# Patient Record
Sex: Male | Born: 1935 | Race: White | Hispanic: No | Marital: Married | Smoking: Never smoker
Health system: Southern US, Community
[De-identification: ages and names within clinical notes are randomized; demographics above are authoritative.]

## PROBLEM LIST (undated history)

## (undated) DIAGNOSIS — F329 Major depressive disorder, single episode, unspecified: Secondary | ICD-10-CM

## (undated) DIAGNOSIS — H269 Unspecified cataract: Secondary | ICD-10-CM

## (undated) DIAGNOSIS — I4821 Permanent atrial fibrillation: Secondary | ICD-10-CM

## (undated) DIAGNOSIS — E785 Hyperlipidemia, unspecified: Secondary | ICD-10-CM

## (undated) DIAGNOSIS — I2510 Atherosclerotic heart disease of native coronary artery without angina pectoris: Secondary | ICD-10-CM

## (undated) DIAGNOSIS — I255 Ischemic cardiomyopathy: Secondary | ICD-10-CM

## (undated) DIAGNOSIS — F32A Depression, unspecified: Secondary | ICD-10-CM

## (undated) DIAGNOSIS — I340 Nonrheumatic mitral (valve) insufficiency: Secondary | ICD-10-CM

## (undated) DIAGNOSIS — D649 Anemia, unspecified: Secondary | ICD-10-CM

## (undated) DIAGNOSIS — R339 Retention of urine, unspecified: Secondary | ICD-10-CM

## (undated) DIAGNOSIS — N400 Enlarged prostate without lower urinary tract symptoms: Secondary | ICD-10-CM

## (undated) DIAGNOSIS — R001 Bradycardia, unspecified: Secondary | ICD-10-CM

## (undated) DIAGNOSIS — K449 Diaphragmatic hernia without obstruction or gangrene: Secondary | ICD-10-CM

## (undated) DIAGNOSIS — I5022 Chronic systolic (congestive) heart failure: Secondary | ICD-10-CM

## (undated) DIAGNOSIS — I10 Essential (primary) hypertension: Secondary | ICD-10-CM

## (undated) HISTORY — PX: HERNIA REPAIR: SHX51

## (undated) HISTORY — DX: Nonrheumatic mitral (valve) insufficiency: I34.0

## (undated) HISTORY — DX: Depression, unspecified: F32.A

## (undated) HISTORY — DX: Essential (primary) hypertension: I10

## (undated) HISTORY — DX: Anemia, unspecified: D64.9

## (undated) HISTORY — PX: HAND SURGERY: SHX662

## (undated) HISTORY — DX: Diaphragmatic hernia without obstruction or gangrene: K44.9

## (undated) HISTORY — DX: Atherosclerotic heart disease of native coronary artery without angina pectoris: I25.10

## (undated) HISTORY — DX: Ischemic cardiomyopathy: I25.5

## (undated) HISTORY — DX: Permanent atrial fibrillation: I48.21

## (undated) HISTORY — DX: Benign prostatic hyperplasia without lower urinary tract symptoms: N40.0

## (undated) HISTORY — PX: EYE SURGERY: SHX253

## (undated) HISTORY — DX: Unspecified cataract: H26.9

## (undated) HISTORY — DX: Major depressive disorder, single episode, unspecified: F32.9

## (undated) HISTORY — PX: CATARACT EXTRACTION: SUR2

## (undated) HISTORY — DX: Hyperlipidemia, unspecified: E78.5

## (undated) HISTORY — DX: Bradycardia, unspecified: R00.1

## (undated) HISTORY — PX: CARDIAC DEFIBRILLATOR PLACEMENT: SHX171

## (undated) HISTORY — DX: Chronic systolic (congestive) heart failure: I50.22

## (undated) HISTORY — DX: Retention of urine, unspecified: R33.9

---

## 2000-01-09 ENCOUNTER — Ambulatory Visit (HOSPITAL_COMMUNITY): Admission: RE | Admit: 2000-01-09 | Discharge: 2000-01-10 | Payer: Self-pay | Admitting: *Deleted

## 2001-07-08 ENCOUNTER — Encounter: Payer: Self-pay | Admitting: Specialist

## 2001-07-08 ENCOUNTER — Encounter: Admission: RE | Admit: 2001-07-08 | Discharge: 2001-07-08 | Payer: Self-pay | Admitting: Specialist

## 2001-08-11 ENCOUNTER — Encounter: Admission: RE | Admit: 2001-08-11 | Discharge: 2001-09-15 | Payer: Self-pay | Admitting: Specialist

## 2001-11-13 ENCOUNTER — Inpatient Hospital Stay (HOSPITAL_COMMUNITY): Admission: EM | Admit: 2001-11-13 | Discharge: 2001-11-15 | Payer: Self-pay | Admitting: Internal Medicine

## 2001-11-13 ENCOUNTER — Encounter (INDEPENDENT_AMBULATORY_CARE_PROVIDER_SITE_OTHER): Payer: Self-pay | Admitting: *Deleted

## 2001-12-22 ENCOUNTER — Encounter: Payer: Self-pay | Admitting: Internal Medicine

## 2001-12-22 ENCOUNTER — Ambulatory Visit (HOSPITAL_COMMUNITY): Admission: RE | Admit: 2001-12-22 | Discharge: 2001-12-22 | Payer: Self-pay | Admitting: Internal Medicine

## 2001-12-29 ENCOUNTER — Encounter: Payer: Self-pay | Admitting: Internal Medicine

## 2001-12-29 ENCOUNTER — Ambulatory Visit (HOSPITAL_COMMUNITY): Admission: RE | Admit: 2001-12-29 | Discharge: 2001-12-29 | Payer: Self-pay | Admitting: Internal Medicine

## 2002-04-22 ENCOUNTER — Encounter: Admission: RE | Admit: 2002-04-22 | Discharge: 2002-05-28 | Payer: Self-pay | Admitting: Specialist

## 2003-03-30 ENCOUNTER — Encounter: Admission: RE | Admit: 2003-03-30 | Discharge: 2003-03-30 | Payer: Self-pay | Admitting: *Deleted

## 2003-10-03 ENCOUNTER — Encounter
Admission: RE | Admit: 2003-10-03 | Discharge: 2003-11-01 | Payer: Self-pay | Admitting: Physical Medicine and Rehabilitation

## 2004-07-17 ENCOUNTER — Encounter: Payer: Self-pay | Admitting: Internal Medicine

## 2004-07-19 ENCOUNTER — Ambulatory Visit (HOSPITAL_COMMUNITY): Admission: RE | Admit: 2004-07-19 | Discharge: 2004-07-19 | Payer: Self-pay | Admitting: Internal Medicine

## 2005-01-28 ENCOUNTER — Encounter: Payer: Self-pay | Admitting: Internal Medicine

## 2005-06-18 ENCOUNTER — Encounter: Payer: Self-pay | Admitting: Internal Medicine

## 2005-07-01 ENCOUNTER — Ambulatory Visit: Payer: Self-pay | Admitting: Internal Medicine

## 2005-07-02 ENCOUNTER — Ambulatory Visit: Payer: Self-pay | Admitting: Internal Medicine

## 2005-07-29 ENCOUNTER — Ambulatory Visit: Payer: Self-pay | Admitting: Internal Medicine

## 2005-08-06 ENCOUNTER — Ambulatory Visit: Payer: Self-pay | Admitting: Internal Medicine

## 2005-08-06 ENCOUNTER — Encounter: Payer: Self-pay | Admitting: Internal Medicine

## 2005-08-06 ENCOUNTER — Ambulatory Visit: Payer: Self-pay

## 2005-08-09 ENCOUNTER — Ambulatory Visit: Payer: Self-pay | Admitting: Internal Medicine

## 2005-08-13 ENCOUNTER — Ambulatory Visit (HOSPITAL_COMMUNITY): Admission: AD | Admit: 2005-08-13 | Discharge: 2005-08-15 | Payer: Self-pay | Admitting: Cardiovascular Disease

## 2005-08-15 ENCOUNTER — Ambulatory Visit: Payer: Self-pay | Admitting: Cardiology

## 2005-08-28 ENCOUNTER — Ambulatory Visit: Payer: Self-pay

## 2005-08-28 ENCOUNTER — Ambulatory Visit: Payer: Self-pay | Admitting: Cardiology

## 2005-09-16 ENCOUNTER — Ambulatory Visit: Payer: Self-pay | Admitting: Internal Medicine

## 2005-10-09 ENCOUNTER — Ambulatory Visit: Payer: Self-pay | Admitting: Internal Medicine

## 2005-12-17 ENCOUNTER — Ambulatory Visit: Payer: Self-pay | Admitting: Internal Medicine

## 2006-02-03 ENCOUNTER — Ambulatory Visit: Payer: Self-pay | Admitting: Internal Medicine

## 2006-02-03 ENCOUNTER — Inpatient Hospital Stay (HOSPITAL_COMMUNITY): Admission: EM | Admit: 2006-02-03 | Discharge: 2006-02-07 | Payer: Self-pay | Admitting: Emergency Medicine

## 2006-02-04 ENCOUNTER — Encounter: Payer: Self-pay | Admitting: Cardiology

## 2006-02-10 ENCOUNTER — Ambulatory Visit: Payer: Self-pay | Admitting: Cardiology

## 2006-02-11 ENCOUNTER — Ambulatory Visit: Payer: Self-pay | Admitting: Internal Medicine

## 2006-02-14 ENCOUNTER — Ambulatory Visit: Payer: Self-pay | Admitting: Cardiovascular Disease

## 2006-02-14 ENCOUNTER — Ambulatory Visit: Payer: Self-pay | Admitting: Internal Medicine

## 2006-02-21 ENCOUNTER — Ambulatory Visit: Payer: Self-pay | Admitting: Cardiovascular Disease

## 2006-02-28 ENCOUNTER — Ambulatory Visit: Payer: Self-pay | Admitting: Cardiovascular Disease

## 2006-03-06 ENCOUNTER — Ambulatory Visit: Payer: Self-pay | Admitting: Cardiology

## 2006-03-11 ENCOUNTER — Ambulatory Visit: Payer: Self-pay | Admitting: Cardiology

## 2006-03-14 ENCOUNTER — Ambulatory Visit: Payer: Self-pay | Admitting: Internal Medicine

## 2006-03-17 ENCOUNTER — Ambulatory Visit: Payer: Self-pay | Admitting: Internal Medicine

## 2006-03-17 ENCOUNTER — Ambulatory Visit (HOSPITAL_COMMUNITY): Admission: RE | Admit: 2006-03-17 | Discharge: 2006-03-17 | Payer: Self-pay | Admitting: Internal Medicine

## 2006-03-24 ENCOUNTER — Ambulatory Visit: Payer: Self-pay | Admitting: Internal Medicine

## 2006-04-01 ENCOUNTER — Ambulatory Visit: Payer: Self-pay | Admitting: Cardiology

## 2006-04-01 ENCOUNTER — Ambulatory Visit: Payer: Self-pay | Admitting: Internal Medicine

## 2006-04-02 ENCOUNTER — Ambulatory Visit: Payer: Self-pay | Admitting: Internal Medicine

## 2006-04-02 ENCOUNTER — Inpatient Hospital Stay (HOSPITAL_COMMUNITY): Admission: AD | Admit: 2006-04-02 | Discharge: 2006-04-07 | Payer: Self-pay | Admitting: Internal Medicine

## 2006-04-09 ENCOUNTER — Ambulatory Visit: Payer: Self-pay | Admitting: Cardiology

## 2006-04-14 ENCOUNTER — Ambulatory Visit: Payer: Self-pay | Admitting: Cardiology

## 2006-04-18 ENCOUNTER — Ambulatory Visit: Payer: Self-pay | Admitting: Internal Medicine

## 2006-04-25 ENCOUNTER — Ambulatory Visit: Payer: Self-pay | Admitting: Cardiology

## 2006-05-05 ENCOUNTER — Ambulatory Visit: Admission: RE | Admit: 2006-05-05 | Discharge: 2006-05-05 | Payer: Self-pay | Admitting: Internal Medicine

## 2006-05-09 ENCOUNTER — Ambulatory Visit: Payer: Self-pay | Admitting: Cardiology

## 2006-05-23 ENCOUNTER — Ambulatory Visit: Payer: Self-pay | Admitting: Cardiovascular Disease

## 2006-06-10 ENCOUNTER — Ambulatory Visit: Payer: Self-pay | Admitting: *Deleted

## 2006-06-16 ENCOUNTER — Ambulatory Visit: Payer: Self-pay | Admitting: Internal Medicine

## 2006-06-18 ENCOUNTER — Encounter: Payer: Self-pay | Admitting: Cardiovascular Disease

## 2006-06-18 ENCOUNTER — Ambulatory Visit: Payer: Self-pay

## 2006-06-26 ENCOUNTER — Ambulatory Visit: Payer: Self-pay | Admitting: Pulmonary Disease

## 2006-06-27 ENCOUNTER — Ambulatory Visit: Payer: Self-pay | Admitting: Cardiology

## 2006-07-25 ENCOUNTER — Ambulatory Visit: Payer: Self-pay | Admitting: Cardiovascular Disease

## 2006-08-11 ENCOUNTER — Ambulatory Visit: Payer: Self-pay | Admitting: Pulmonary Disease

## 2006-08-15 ENCOUNTER — Ambulatory Visit: Payer: Self-pay | Admitting: Cardiology

## 2006-08-26 ENCOUNTER — Ambulatory Visit: Payer: Self-pay | Admitting: Internal Medicine

## 2006-08-26 ENCOUNTER — Ambulatory Visit: Payer: Self-pay | Admitting: Cardiovascular Disease

## 2006-08-26 ENCOUNTER — Ambulatory Visit: Payer: Self-pay | Admitting: Cardiology

## 2006-09-02 ENCOUNTER — Ambulatory Visit: Payer: Self-pay | Admitting: Internal Medicine

## 2006-09-05 ENCOUNTER — Ambulatory Visit: Payer: Self-pay | Admitting: Internal Medicine

## 2006-09-05 ENCOUNTER — Observation Stay (HOSPITAL_COMMUNITY): Admission: AD | Admit: 2006-09-05 | Discharge: 2006-09-06 | Payer: Self-pay | Admitting: Internal Medicine

## 2006-09-09 ENCOUNTER — Ambulatory Visit: Payer: Self-pay | Admitting: Internal Medicine

## 2006-09-16 ENCOUNTER — Ambulatory Visit: Payer: Self-pay | Admitting: Internal Medicine

## 2006-09-18 ENCOUNTER — Ambulatory Visit (HOSPITAL_COMMUNITY): Admission: RE | Admit: 2006-09-18 | Discharge: 2006-09-18 | Payer: Self-pay | Admitting: Internal Medicine

## 2006-09-26 ENCOUNTER — Ambulatory Visit: Payer: Self-pay | Admitting: Internal Medicine

## 2006-09-26 ENCOUNTER — Ambulatory Visit: Payer: Self-pay | Admitting: Cardiology

## 2006-09-30 ENCOUNTER — Ambulatory Visit: Payer: Self-pay | Admitting: Cardiology

## 2006-10-06 ENCOUNTER — Encounter: Payer: Self-pay | Admitting: Internal Medicine

## 2006-10-13 ENCOUNTER — Ambulatory Visit: Payer: Self-pay | Admitting: Cardiovascular Disease

## 2006-10-22 ENCOUNTER — Ambulatory Visit: Payer: Self-pay | Admitting: Cardiology

## 2006-10-29 ENCOUNTER — Encounter: Payer: Self-pay | Admitting: Internal Medicine

## 2006-10-30 ENCOUNTER — Ambulatory Visit: Payer: Self-pay | Admitting: Internal Medicine

## 2006-11-07 ENCOUNTER — Ambulatory Visit: Payer: Self-pay | Admitting: Nurse Practitioner

## 2006-11-07 ENCOUNTER — Ambulatory Visit: Payer: Self-pay | Admitting: Cardiology

## 2006-11-20 ENCOUNTER — Ambulatory Visit: Payer: Self-pay | Admitting: Cardiology

## 2006-12-16 ENCOUNTER — Ambulatory Visit: Payer: Self-pay | Admitting: Internal Medicine

## 2006-12-16 ENCOUNTER — Ambulatory Visit: Payer: Self-pay | Admitting: Cardiology

## 2007-01-07 ENCOUNTER — Ambulatory Visit: Payer: Self-pay | Admitting: Cardiology

## 2007-01-07 ENCOUNTER — Ambulatory Visit: Payer: Self-pay | Admitting: Internal Medicine

## 2007-01-16 ENCOUNTER — Ambulatory Visit: Payer: Self-pay | Admitting: Cardiology

## 2007-01-16 ENCOUNTER — Ambulatory Visit: Payer: Self-pay | Admitting: Internal Medicine

## 2007-01-16 LAB — CONVERTED CEMR LAB
Albumin: 3.8 g/dL (ref 3.5–5.2)
Alkaline Phosphatase: 68 units/L (ref 39–117)
BUN: 25 mg/dL — ABNORMAL HIGH (ref 6–23)
Basophils Absolute: 0 10*3/uL (ref 0.0–0.1)
Creatinine, Ser: 1.2 mg/dL (ref 0.4–1.5)
Eosinophils Absolute: 0.1 10*3/uL (ref 0.0–0.6)
GFR calc Af Amer: 75 mL/min
GFR calc non Af Amer: 62 mL/min
HDL: 40.6 mg/dL (ref >39.0)
Hemoglobin: 13.5 g/dL (ref 13.0–17.0)
INR: 3.2 — ABNORMAL HIGH (ref 0.9–2.0)
MCHC: 35.3 g/dL (ref 30.0–36.0)
MCV: 100.6 fL — ABNORMAL HIGH (ref 78.0–100.0)
Monocytes Absolute: 0.7 10*3/uL (ref 0.2–0.7)
Monocytes Relative: 10 % (ref 3.0–11.0)
Neutrophils Relative %: 73.5 % (ref 43.0–77.0)
Potassium: 4.1 meq/L (ref 3.5–5.1)
Pro B Natriuretic peptide (BNP): 926 pg/mL — ABNORMAL HIGH (ref 0.0–100.0)
RDW: 13.7 % (ref 11.5–14.6)
Sodium: 142 meq/L (ref 135–145)
Total CHOL/HDL Ratio: 3.5
Triglycerides: 63 mg/dL (ref 0–149)
VLDL: 13 mg/dL (ref 0–40)
aPTT: 41.7 s — ABNORMAL HIGH (ref 26.5–36.5)

## 2007-01-30 ENCOUNTER — Ambulatory Visit: Payer: Self-pay | Admitting: Internal Medicine

## 2007-02-26 ENCOUNTER — Ambulatory Visit: Payer: Self-pay | Admitting: Internal Medicine

## 2007-02-26 ENCOUNTER — Ambulatory Visit: Payer: Self-pay | Admitting: Cardiology

## 2007-02-26 LAB — CONVERTED CEMR LAB
BUN: 27 mg/dL — ABNORMAL HIGH (ref 6–23)
Calcium: 9.2 mg/dL (ref 8.4–10.5)
Chloride: 103 meq/L (ref 96–112)
Creatinine, Ser: 1.2 mg/dL (ref 0.4–1.5)
GFR calc non Af Amer: 62 mL/min
Pro B Natriuretic peptide (BNP): 398 pg/mL — ABNORMAL HIGH (ref 0.0–100.0)
Sodium: 141 meq/L (ref 135–145)

## 2007-03-02 ENCOUNTER — Emergency Department (HOSPITAL_COMMUNITY): Admission: EM | Admit: 2007-03-02 | Discharge: 2007-03-03 | Payer: Self-pay | Admitting: Emergency Medicine

## 2007-03-02 ENCOUNTER — Ambulatory Visit: Payer: Self-pay | Admitting: Cardiovascular Disease

## 2007-03-17 ENCOUNTER — Ambulatory Visit: Payer: Self-pay | Admitting: Internal Medicine

## 2007-03-25 ENCOUNTER — Ambulatory Visit: Payer: Self-pay | Admitting: *Deleted

## 2007-04-15 ENCOUNTER — Ambulatory Visit: Payer: Self-pay | Admitting: Internal Medicine

## 2007-05-13 ENCOUNTER — Ambulatory Visit: Payer: Self-pay | Admitting: Cardiology

## 2007-06-10 ENCOUNTER — Ambulatory Visit: Payer: Self-pay | Admitting: Cardiology

## 2007-06-12 ENCOUNTER — Ambulatory Visit: Payer: Self-pay | Admitting: Internal Medicine

## 2007-06-22 ENCOUNTER — Ambulatory Visit: Payer: Self-pay | Admitting: Internal Medicine

## 2007-07-08 ENCOUNTER — Ambulatory Visit: Payer: Self-pay | Admitting: Cardiology

## 2007-07-20 ENCOUNTER — Ambulatory Visit: Payer: Self-pay | Admitting: Cardiology

## 2007-07-20 ENCOUNTER — Ambulatory Visit: Payer: Self-pay | Admitting: Internal Medicine

## 2007-07-20 LAB — CONVERTED CEMR LAB
AST: 25 units/L (ref 0–37)
Albumin: 3.9 g/dL (ref 3.5–5.2)
Alkaline Phosphatase: 49 units/L (ref 39–117)
BUN: 23 mg/dL (ref 6–23)
Chloride: 106 meq/L (ref 96–112)
Creatinine, Ser: 1.1 mg/dL (ref 0.4–1.5)
Free T4: 0.8 ng/dL (ref 0.6–1.6)
GFR calc non Af Amer: 68 mL/min
Sodium: 145 meq/L (ref 135–145)
T3, Free: 2.3 pg/mL (ref 2.3–4.2)
Total Bilirubin: 0.9 mg/dL (ref 0.3–1.2)

## 2007-07-27 ENCOUNTER — Ambulatory Visit: Payer: Self-pay | Admitting: Cardiology

## 2007-07-28 ENCOUNTER — Encounter: Payer: Self-pay | Admitting: Internal Medicine

## 2007-07-28 ENCOUNTER — Ambulatory Visit: Payer: Self-pay

## 2007-08-20 ENCOUNTER — Ambulatory Visit: Payer: Self-pay | Admitting: Cardiology

## 2007-08-27 ENCOUNTER — Ambulatory Visit: Payer: Self-pay | Admitting: Internal Medicine

## 2007-09-15 ENCOUNTER — Ambulatory Visit: Payer: Self-pay | Admitting: Internal Medicine

## 2007-09-17 ENCOUNTER — Ambulatory Visit: Payer: Self-pay | Admitting: Cardiology

## 2007-10-12 ENCOUNTER — Ambulatory Visit: Payer: Self-pay | Admitting: Internal Medicine

## 2007-10-27 ENCOUNTER — Ambulatory Visit: Payer: Self-pay | Admitting: Internal Medicine

## 2007-11-12 ENCOUNTER — Ambulatory Visit: Payer: Self-pay | Admitting: Cardiology

## 2007-11-30 ENCOUNTER — Ambulatory Visit: Payer: Self-pay | Admitting: Cardiology

## 2007-12-17 ENCOUNTER — Ambulatory Visit: Payer: Self-pay | Admitting: Internal Medicine

## 2008-01-19 ENCOUNTER — Ambulatory Visit: Payer: Self-pay | Admitting: Cardiology

## 2008-01-19 ENCOUNTER — Ambulatory Visit: Payer: Self-pay | Admitting: Internal Medicine

## 2008-01-26 ENCOUNTER — Ambulatory Visit: Payer: Self-pay | Admitting: Cardiology

## 2008-01-26 ENCOUNTER — Ambulatory Visit: Payer: Self-pay | Admitting: Internal Medicine

## 2008-01-26 LAB — CONVERTED CEMR LAB
BUN: 24 mg/dL — ABNORMAL HIGH (ref 6–23)
Basophils Relative: 0.9 % (ref 0.0–1.0)
CO2: 32 meq/L (ref 19–32)
Calcium: 9.6 mg/dL (ref 8.4–10.5)
Creatinine, Ser: 1.3 mg/dL (ref 0.4–1.5)
Eosinophils Relative: 2.3 % (ref 0.0–5.0)
Glucose, Bld: 106 mg/dL — ABNORMAL HIGH (ref 70–99)
Hemoglobin: 13.9 g/dL (ref 13.0–17.0)
INR: 2.9 — ABNORMAL HIGH (ref 0.8–1.0)
Monocytes Relative: 11.4 % — ABNORMAL HIGH (ref 3.0–11.0)
Platelets: 184 10*3/uL (ref 150–400)
Prothrombin Time: 21.4 s — ABNORMAL HIGH (ref 10.9–13.3)
RDW: 13.1 % (ref 11.5–14.6)
WBC: 6.2 10*3/uL (ref 4.5–10.5)

## 2008-02-05 ENCOUNTER — Ambulatory Visit: Payer: Self-pay | Admitting: Internal Medicine

## 2008-02-05 ENCOUNTER — Ambulatory Visit (HOSPITAL_COMMUNITY): Admission: RE | Admit: 2008-02-05 | Discharge: 2008-02-05 | Payer: Self-pay | Admitting: Internal Medicine

## 2008-02-08 ENCOUNTER — Ambulatory Visit: Payer: Self-pay | Admitting: Cardiology

## 2008-02-22 ENCOUNTER — Ambulatory Visit: Payer: Self-pay | Admitting: Cardiology

## 2008-03-07 ENCOUNTER — Ambulatory Visit: Payer: Self-pay

## 2008-03-07 ENCOUNTER — Ambulatory Visit: Payer: Self-pay | Admitting: Cardiovascular Disease

## 2008-03-07 LAB — CONVERTED CEMR LAB
Free T4: 1 ng/dL (ref 0.6–1.6)
T3, Free: 2.2 pg/mL — ABNORMAL LOW (ref 2.3–4.2)
TSH: 1.05 microintl units/mL (ref 0.35–5.50)

## 2008-03-18 ENCOUNTER — Ambulatory Visit: Payer: Self-pay | Admitting: Internal Medicine

## 2008-04-12 ENCOUNTER — Ambulatory Visit: Payer: Self-pay | Admitting: Internal Medicine

## 2008-04-12 ENCOUNTER — Ambulatory Visit: Payer: Self-pay | Admitting: Cardiovascular Disease

## 2008-04-12 LAB — CONVERTED CEMR LAB
ALT: 22 units/L (ref 0–53)
AST: 25 units/L (ref 0–37)
Basophils Absolute: 0 10*3/uL (ref 0.0–0.1)
Basophils Relative: 0 % (ref 0.0–1.0)
CO2: 32 meq/L (ref 19–32)
Chloride: 103 meq/L (ref 96–112)
Creatinine, Ser: 1.1 mg/dL (ref 0.4–1.5)
Eosinophils Relative: 3.1 % (ref 0.0–5.0)
Free T4: 1 ng/dL (ref 0.6–1.6)
Glucose, Bld: 113 mg/dL — ABNORMAL HIGH (ref 70–99)
Lymphocytes Relative: 20 % (ref 12.0–46.0)
MCHC: 34 g/dL (ref 30.0–36.0)
Monocytes Relative: 9.2 % (ref 3.0–12.0)
Neutrophils Relative %: 67.7 % (ref 43.0–77.0)
RBC: 3.69 M/uL — ABNORMAL LOW (ref 4.22–5.81)
Total Bilirubin: 0.7 mg/dL (ref 0.3–1.2)
WBC: 5.5 10*3/uL (ref 4.5–10.5)

## 2008-05-10 ENCOUNTER — Ambulatory Visit: Payer: Self-pay | Admitting: Internal Medicine

## 2008-05-23 ENCOUNTER — Ambulatory Visit: Payer: Self-pay | Admitting: Internal Medicine

## 2008-06-01 ENCOUNTER — Encounter: Payer: Self-pay | Admitting: Internal Medicine

## 2008-06-01 LAB — CONVERTED CEMR LAB
ALT: 23 U/L
AST: 24 U/L
BUN: 24 mg/dL
CO2: 28 meq/L
Chloride: 103 meq/L
Cholesterol: 234 mg/dL
Creatinine, Ser: 1.2 mg/dL
Glucose, Bld: 120 mg/dL
HCT: 42.6 %
HDL: 47 mg/dL
Hemoglobin: 14 g/dL
LDL Cholesterol: 165 mg/dL
Lymphocytes, automated: 1.5 %
MCV: 102.7 fL
Platelets: 206 10*3/uL
Potassium: 4.4 meq/L
RBC: 4.2 M/uL
Sodium: 140 meq/L
TSH: 6 u[IU]/mL
Total Bilirubin: 0.5 mg/dL
Triglyceride fasting, serum: 109 mg/dL
WBC: 5.5 10*3/uL

## 2008-06-20 ENCOUNTER — Ambulatory Visit: Payer: Self-pay | Admitting: Cardiovascular Disease

## 2008-07-18 ENCOUNTER — Ambulatory Visit: Payer: Self-pay | Admitting: Cardiology

## 2008-08-15 ENCOUNTER — Ambulatory Visit: Payer: Self-pay | Admitting: Cardiology

## 2008-09-06 ENCOUNTER — Ambulatory Visit: Payer: Self-pay | Admitting: Cardiovascular Disease

## 2008-09-07 ENCOUNTER — Ambulatory Visit: Payer: Self-pay | Admitting: Cardiology

## 2008-10-18 ENCOUNTER — Ambulatory Visit: Payer: Self-pay | Admitting: Internal Medicine

## 2008-11-12 ENCOUNTER — Emergency Department (HOSPITAL_BASED_OUTPATIENT_CLINIC_OR_DEPARTMENT_OTHER): Admission: EM | Admit: 2008-11-12 | Discharge: 2008-11-12 | Payer: Self-pay | Admitting: Internal Medicine

## 2008-11-14 ENCOUNTER — Ambulatory Visit: Payer: Self-pay | Admitting: Cardiology

## 2008-12-05 ENCOUNTER — Ambulatory Visit: Payer: Self-pay | Admitting: Internal Medicine

## 2008-12-26 ENCOUNTER — Ambulatory Visit: Payer: Self-pay | Admitting: Cardiology

## 2009-01-09 ENCOUNTER — Ambulatory Visit: Payer: Self-pay | Admitting: Cardiology

## 2009-01-23 ENCOUNTER — Ambulatory Visit: Payer: Self-pay | Admitting: Cardiology

## 2009-02-06 ENCOUNTER — Ambulatory Visit: Payer: Self-pay | Admitting: Internal Medicine

## 2009-02-13 DIAGNOSIS — I4891 Unspecified atrial fibrillation: Secondary | ICD-10-CM

## 2009-02-13 DIAGNOSIS — I2510 Atherosclerotic heart disease of native coronary artery without angina pectoris: Secondary | ICD-10-CM | POA: Insufficient documentation

## 2009-02-13 DIAGNOSIS — E785 Hyperlipidemia, unspecified: Secondary | ICD-10-CM | POA: Insufficient documentation

## 2009-02-13 DIAGNOSIS — I5022 Chronic systolic (congestive) heart failure: Secondary | ICD-10-CM

## 2009-02-13 DIAGNOSIS — I10 Essential (primary) hypertension: Secondary | ICD-10-CM

## 2009-02-13 DIAGNOSIS — I059 Rheumatic mitral valve disease, unspecified: Secondary | ICD-10-CM | POA: Insufficient documentation

## 2009-02-14 ENCOUNTER — Encounter: Payer: Self-pay | Admitting: Internal Medicine

## 2009-02-14 ENCOUNTER — Ambulatory Visit: Payer: Self-pay | Admitting: Internal Medicine

## 2009-02-23 ENCOUNTER — Ambulatory Visit: Payer: Self-pay | Admitting: Internal Medicine

## 2009-02-23 LAB — CONVERTED CEMR LAB
CO2: 32 meq/L (ref 19–32)
Calcium: 9.4 mg/dL (ref 8.4–10.5)
Glucose, Bld: 111 mg/dL — ABNORMAL HIGH (ref 70–99)
Potassium: 4.4 meq/L (ref 3.5–5.1)
Sodium: 140 meq/L (ref 135–145)

## 2009-02-28 ENCOUNTER — Encounter: Payer: Self-pay | Admitting: Internal Medicine

## 2009-03-07 ENCOUNTER — Ambulatory Visit: Payer: Self-pay | Admitting: Cardiovascular Disease

## 2009-03-09 ENCOUNTER — Encounter: Payer: Self-pay | Admitting: Internal Medicine

## 2009-03-09 LAB — CONVERTED CEMR LAB
ALT: 28 units/L
BUN: 31 mg/dL
Calcium: 9.8 mg/dL
Glucose, Bld: 110 mg/dL
Total Bilirubin: 0.5 mg/dL
Total Protein: 7 g/dL

## 2009-03-14 ENCOUNTER — Telehealth: Payer: Self-pay | Admitting: Internal Medicine

## 2009-03-17 ENCOUNTER — Encounter: Payer: Self-pay | Admitting: Internal Medicine

## 2009-03-17 ENCOUNTER — Encounter: Payer: Self-pay | Admitting: Cardiovascular Disease

## 2009-03-17 ENCOUNTER — Ambulatory Visit: Payer: Self-pay | Admitting: Internal Medicine

## 2009-03-17 DIAGNOSIS — Z95810 Presence of automatic (implantable) cardiac defibrillator: Secondary | ICD-10-CM | POA: Insufficient documentation

## 2009-04-04 ENCOUNTER — Ambulatory Visit: Payer: Self-pay | Admitting: Internal Medicine

## 2009-04-28 ENCOUNTER — Encounter (INDEPENDENT_AMBULATORY_CARE_PROVIDER_SITE_OTHER): Payer: Self-pay | Admitting: *Deleted

## 2009-05-02 ENCOUNTER — Encounter: Payer: Self-pay | Admitting: *Deleted

## 2009-05-02 ENCOUNTER — Ambulatory Visit: Payer: Self-pay | Admitting: Cardiology

## 2009-05-30 ENCOUNTER — Ambulatory Visit: Payer: Self-pay | Admitting: Internal Medicine

## 2009-05-30 LAB — CONVERTED CEMR LAB
POC INR: 2.6
Prothrombin Time: 19.5 s

## 2009-06-07 ENCOUNTER — Encounter: Payer: Self-pay | Admitting: *Deleted

## 2009-06-16 ENCOUNTER — Encounter: Payer: Self-pay | Admitting: Internal Medicine

## 2009-06-19 ENCOUNTER — Ambulatory Visit: Payer: Self-pay | Admitting: Internal Medicine

## 2009-06-21 ENCOUNTER — Encounter: Payer: Self-pay | Admitting: Internal Medicine

## 2009-06-27 ENCOUNTER — Ambulatory Visit: Payer: Self-pay | Admitting: Internal Medicine

## 2009-06-27 LAB — CONVERTED CEMR LAB: Prothrombin Time: 18.4 s

## 2009-07-25 ENCOUNTER — Ambulatory Visit: Payer: Self-pay | Admitting: Internal Medicine

## 2009-07-25 LAB — CONVERTED CEMR LAB: POC INR: 2.5

## 2009-08-21 ENCOUNTER — Telehealth (INDEPENDENT_AMBULATORY_CARE_PROVIDER_SITE_OTHER): Payer: Self-pay | Admitting: *Deleted

## 2009-08-21 ENCOUNTER — Telehealth: Payer: Self-pay | Admitting: Internal Medicine

## 2009-08-29 ENCOUNTER — Telehealth (INDEPENDENT_AMBULATORY_CARE_PROVIDER_SITE_OTHER): Payer: Self-pay | Admitting: *Deleted

## 2009-08-30 ENCOUNTER — Ambulatory Visit: Payer: Self-pay | Admitting: Internal Medicine

## 2009-08-30 DIAGNOSIS — N401 Enlarged prostate with lower urinary tract symptoms: Secondary | ICD-10-CM

## 2009-08-31 ENCOUNTER — Ambulatory Visit: Payer: Self-pay | Admitting: Internal Medicine

## 2009-09-11 ENCOUNTER — Telehealth: Payer: Self-pay | Admitting: Internal Medicine

## 2009-09-11 ENCOUNTER — Ambulatory Visit: Payer: Self-pay | Admitting: Internal Medicine

## 2009-09-13 ENCOUNTER — Encounter: Payer: Self-pay | Admitting: Internal Medicine

## 2009-09-13 DIAGNOSIS — E291 Testicular hypofunction: Secondary | ICD-10-CM

## 2009-09-20 ENCOUNTER — Encounter: Payer: Self-pay | Admitting: Internal Medicine

## 2009-09-26 ENCOUNTER — Encounter: Payer: Self-pay | Admitting: Internal Medicine

## 2009-09-26 ENCOUNTER — Ambulatory Visit: Payer: Self-pay | Admitting: Cardiovascular Disease

## 2009-09-26 ENCOUNTER — Ambulatory Visit: Payer: Self-pay | Admitting: Internal Medicine

## 2009-09-29 ENCOUNTER — Ambulatory Visit: Payer: Self-pay | Admitting: Internal Medicine

## 2009-09-29 ENCOUNTER — Ambulatory Visit (HOSPITAL_COMMUNITY): Admission: RE | Admit: 2009-09-29 | Discharge: 2009-09-29 | Payer: Self-pay | Admitting: Internal Medicine

## 2009-10-16 ENCOUNTER — Ambulatory Visit: Payer: Self-pay | Admitting: Internal Medicine

## 2009-10-25 ENCOUNTER — Ambulatory Visit: Payer: Self-pay | Admitting: Internal Medicine

## 2009-10-30 ENCOUNTER — Telehealth: Payer: Self-pay | Admitting: Internal Medicine

## 2009-11-08 ENCOUNTER — Ambulatory Visit: Payer: Self-pay | Admitting: Internal Medicine

## 2009-11-08 LAB — CONVERTED CEMR LAB
ALT: 20 units/L (ref 0–53)
AST: 28 units/L (ref 0–37)
Alkaline Phosphatase: 43 units/L (ref 39–117)
Basophils Relative: 0.6 % (ref 0.0–3.0)
Chloride: 103 meq/L (ref 96–112)
Eosinophils Relative: 1.3 % (ref 0.0–5.0)
GFR calc non Af Amer: 56 mL/min (ref >60)
HCT: 37.9 % — ABNORMAL LOW (ref 39.0–52.0)
LDL Cholesterol: 89 mg/dL (ref 0–99)
Lymphs Abs: 1.5 10*3/uL (ref 0.7–4.0)
MCV: 102.4 fL — ABNORMAL HIGH (ref 78.0–100.0)
Monocytes Absolute: 0.6 10*3/uL (ref 0.1–1.0)
Monocytes Relative: 10.6 % (ref 3.0–12.0)
Neutrophils Relative %: 62.2 % (ref 43.0–77.0)
Platelets: 162 10*3/uL (ref 150.0–400.0)
Potassium: 4.6 meq/L (ref 3.5–5.1)
RBC: 3.7 M/uL — ABNORMAL LOW (ref 4.22–5.81)
TSH: 1.32 microintl units/mL (ref 0.35–5.50)
Total Bilirubin: 1 mg/dL (ref 0.3–1.2)
Total CHOL/HDL Ratio: 2
VLDL: 13 mg/dL (ref 0.0–40.0)
WBC: 5.8 10*3/uL (ref 4.5–10.5)

## 2009-11-09 ENCOUNTER — Ambulatory Visit: Payer: Self-pay | Admitting: Cardiovascular Disease

## 2009-11-09 LAB — CONVERTED CEMR LAB: POC INR: 2.6

## 2009-11-30 ENCOUNTER — Ambulatory Visit: Payer: Self-pay | Admitting: Internal Medicine

## 2009-12-12 ENCOUNTER — Ambulatory Visit: Payer: Self-pay | Admitting: Internal Medicine

## 2009-12-21 ENCOUNTER — Ambulatory Visit: Payer: Self-pay | Admitting: Cardiology

## 2009-12-21 ENCOUNTER — Ambulatory Visit: Payer: Self-pay | Admitting: Internal Medicine

## 2010-01-11 ENCOUNTER — Ambulatory Visit: Payer: Self-pay | Admitting: Internal Medicine

## 2010-01-11 ENCOUNTER — Ambulatory Visit: Payer: Self-pay | Admitting: Cardiology

## 2010-01-11 LAB — CONVERTED CEMR LAB: POC INR: 2.3

## 2010-02-07 ENCOUNTER — Encounter: Payer: Self-pay | Admitting: Internal Medicine

## 2010-02-08 ENCOUNTER — Ambulatory Visit: Payer: Self-pay | Admitting: Cardiology

## 2010-02-08 LAB — CONVERTED CEMR LAB: POC INR: 2.5

## 2010-02-14 ENCOUNTER — Ambulatory Visit: Payer: Self-pay | Admitting: Internal Medicine

## 2010-03-15 ENCOUNTER — Ambulatory Visit: Payer: Self-pay | Admitting: Internal Medicine

## 2010-03-15 LAB — CONVERTED CEMR LAB: POC INR: 2

## 2010-03-16 ENCOUNTER — Telehealth: Payer: Self-pay | Admitting: Internal Medicine

## 2010-03-19 ENCOUNTER — Telehealth: Payer: Self-pay | Admitting: Internal Medicine

## 2010-03-28 ENCOUNTER — Ambulatory Visit: Payer: Self-pay | Admitting: Internal Medicine

## 2010-03-28 ENCOUNTER — Encounter: Payer: Self-pay | Admitting: Internal Medicine

## 2010-03-28 DIAGNOSIS — R5381 Other malaise: Secondary | ICD-10-CM | POA: Insufficient documentation

## 2010-03-28 DIAGNOSIS — R5383 Other fatigue: Secondary | ICD-10-CM

## 2010-03-29 LAB — CONVERTED CEMR LAB
Basophils Absolute: 0 10*3/uL (ref 0.0–0.1)
Basophils Relative: 0.5 % (ref 0.0–3.0)
Calcium: 9.3 mg/dL (ref 8.4–10.5)
Eosinophils Relative: 1.1 % (ref 0.0–5.0)
GFR calc non Af Amer: 61.36 mL/min (ref >60)
HCT: 36 % — ABNORMAL LOW (ref 39.0–52.0)
Hemoglobin: 12.6 g/dL — ABNORMAL LOW (ref 13.0–17.0)
Lymphocytes Relative: 28.8 % (ref 12.0–46.0)
Lymphs Abs: 1.7 10*3/uL (ref 0.7–4.0)
Monocytes Relative: 10.8 % (ref 3.0–12.0)
Neutro Abs: 3.6 10*3/uL (ref 1.4–7.7)
Potassium: 4.1 meq/L (ref 3.5–5.1)
RBC: 3.56 M/uL — ABNORMAL LOW (ref 4.22–5.81)
RDW: 14.1 % (ref 11.5–14.6)
Sodium: 143 meq/L (ref 135–145)
WBC: 6 10*3/uL (ref 4.5–10.5)

## 2010-04-16 ENCOUNTER — Ambulatory Visit: Payer: Self-pay

## 2010-04-16 ENCOUNTER — Encounter: Payer: Self-pay | Admitting: Internal Medicine

## 2010-04-16 ENCOUNTER — Ambulatory Visit: Payer: Self-pay | Admitting: Internal Medicine

## 2010-04-16 ENCOUNTER — Ambulatory Visit (HOSPITAL_COMMUNITY): Admission: RE | Admit: 2010-04-16 | Discharge: 2010-04-16 | Payer: Self-pay | Admitting: Internal Medicine

## 2010-04-18 ENCOUNTER — Ambulatory Visit: Payer: Self-pay | Admitting: Internal Medicine

## 2010-04-18 LAB — CONVERTED CEMR LAB: POC INR: 1.8

## 2010-05-09 ENCOUNTER — Ambulatory Visit: Payer: Self-pay | Admitting: Cardiology

## 2010-05-09 LAB — CONVERTED CEMR LAB: POC INR: 2.1

## 2010-05-30 ENCOUNTER — Telehealth: Payer: Self-pay | Admitting: Internal Medicine

## 2010-06-07 ENCOUNTER — Ambulatory Visit: Payer: Self-pay | Admitting: Internal Medicine

## 2010-06-07 LAB — CONVERTED CEMR LAB: POC INR: 1.8

## 2010-06-28 ENCOUNTER — Ambulatory Visit: Payer: Self-pay | Admitting: Internal Medicine

## 2010-07-03 ENCOUNTER — Telehealth: Payer: Self-pay | Admitting: Internal Medicine

## 2010-07-10 ENCOUNTER — Ambulatory Visit: Payer: Self-pay | Admitting: Cardiology

## 2010-07-10 ENCOUNTER — Ambulatory Visit: Payer: Self-pay | Admitting: Internal Medicine

## 2010-07-10 LAB — CONVERTED CEMR LAB: POC INR: 2

## 2010-07-11 ENCOUNTER — Telehealth: Payer: Self-pay | Admitting: Internal Medicine

## 2010-07-20 ENCOUNTER — Telehealth: Payer: Self-pay | Admitting: Internal Medicine

## 2010-07-30 ENCOUNTER — Telehealth: Payer: Self-pay | Admitting: Internal Medicine

## 2010-07-31 ENCOUNTER — Ambulatory Visit: Payer: Self-pay | Admitting: Cardiovascular Disease

## 2010-08-21 ENCOUNTER — Ambulatory Visit: Payer: Self-pay | Admitting: Internal Medicine

## 2010-08-29 ENCOUNTER — Ambulatory Visit: Payer: Self-pay | Admitting: Internal Medicine

## 2010-09-17 ENCOUNTER — Ambulatory Visit: Payer: Self-pay | Admitting: Internal Medicine

## 2010-09-25 ENCOUNTER — Ambulatory Visit: Payer: Self-pay | Admitting: Internal Medicine

## 2010-09-26 ENCOUNTER — Ambulatory Visit: Payer: Self-pay | Admitting: Internal Medicine

## 2010-10-16 ENCOUNTER — Ambulatory Visit: Payer: Self-pay | Admitting: Internal Medicine

## 2010-10-17 ENCOUNTER — Ambulatory Visit: Payer: Self-pay | Admitting: Internal Medicine

## 2010-10-17 ENCOUNTER — Ambulatory Visit: Payer: Self-pay | Admitting: Cardiology

## 2010-10-17 LAB — CONVERTED CEMR LAB: POC INR: 1.9

## 2010-10-22 ENCOUNTER — Encounter: Payer: Self-pay | Admitting: Internal Medicine

## 2010-10-23 ENCOUNTER — Ambulatory Visit: Payer: Self-pay | Admitting: Internal Medicine

## 2010-10-29 ENCOUNTER — Telehealth: Payer: Self-pay | Admitting: Internal Medicine

## 2010-10-31 ENCOUNTER — Ambulatory Visit: Payer: Self-pay | Admitting: Internal Medicine

## 2010-10-31 ENCOUNTER — Ambulatory Visit (HOSPITAL_COMMUNITY)
Admission: RE | Admit: 2010-10-31 | Discharge: 2010-10-31 | Payer: Self-pay | Source: Home / Self Care | Admitting: Internal Medicine

## 2010-10-31 LAB — CONVERTED CEMR LAB
Basophils Relative: 0.6 % (ref 0.0–3.0)
Chloride: 100 meq/L (ref 96–112)
Eosinophils Relative: 0.8 % (ref 0.0–5.0)
GFR calc non Af Amer: 70.71 mL/min (ref >60)
Glucose, Bld: 123 mg/dL — ABNORMAL HIGH (ref 70–99)
HCT: 37.4 % — ABNORMAL LOW (ref 39.0–52.0)
Hemoglobin: 12.9 g/dL — ABNORMAL LOW (ref 13.0–17.0)
Lymphs Abs: 1.4 10*3/uL (ref 0.7–4.0)
Monocytes Relative: 9.2 % (ref 3.0–12.0)
Neutro Abs: 4 10*3/uL (ref 1.4–7.7)
Potassium: 4.5 meq/L (ref 3.5–5.1)
Sodium: 139 meq/L (ref 135–145)
WBC: 6 10*3/uL (ref 4.5–10.5)

## 2010-11-05 ENCOUNTER — Ambulatory Visit: Payer: Self-pay | Admitting: Internal Medicine

## 2010-11-05 ENCOUNTER — Telehealth: Payer: Self-pay | Admitting: Internal Medicine

## 2010-11-06 ENCOUNTER — Ambulatory Visit: Payer: Self-pay | Admitting: Internal Medicine

## 2010-11-06 DIAGNOSIS — Z950 Presence of cardiac pacemaker: Secondary | ICD-10-CM | POA: Insufficient documentation

## 2010-11-09 ENCOUNTER — Telehealth: Payer: Self-pay | Admitting: Internal Medicine

## 2010-11-12 ENCOUNTER — Ambulatory Visit: Payer: Self-pay | Admitting: Internal Medicine

## 2010-11-14 ENCOUNTER — Ambulatory Visit: Payer: Self-pay

## 2010-11-21 ENCOUNTER — Ambulatory Visit: Payer: Self-pay | Admitting: Internal Medicine

## 2010-12-04 ENCOUNTER — Telehealth: Payer: Self-pay | Admitting: Internal Medicine

## 2010-12-05 ENCOUNTER — Encounter: Payer: Self-pay | Admitting: Internal Medicine

## 2010-12-05 ENCOUNTER — Ambulatory Visit: Admission: RE | Admit: 2010-12-05 | Discharge: 2010-12-05 | Payer: Self-pay | Source: Home / Self Care

## 2010-12-17 ENCOUNTER — Ambulatory Visit: Admission: RE | Admit: 2010-12-17 | Discharge: 2010-12-17 | Payer: Self-pay | Source: Home / Self Care

## 2010-12-23 ENCOUNTER — Encounter: Payer: Self-pay | Admitting: Internal Medicine

## 2010-12-24 ENCOUNTER — Ambulatory Visit
Admission: RE | Admit: 2010-12-24 | Discharge: 2010-12-24 | Payer: Self-pay | Source: Home / Self Care | Attending: Cardiology | Admitting: Cardiology

## 2010-12-30 LAB — CONVERTED CEMR LAB
Alkaline Phosphatase: 37 units/L — ABNORMAL LOW (ref 39–117)
BUN: 25 mg/dL — ABNORMAL HIGH (ref 6–23)
Basophils Absolute: 0 10*3/uL (ref 0.0–0.1)
Bilirubin, Direct: 0.2 mg/dL (ref 0.0–0.3)
CO2: 27 meq/L (ref 19–32)
CO2: 30 meq/L (ref 19–32)
Calcium: 9.5 mg/dL (ref 8.4–10.5)
Chloride: 101 meq/L (ref 96–112)
Chloride: 102 meq/L (ref 96–112)
Creatinine, Ser: 1.1 mg/dL (ref 0.4–1.5)
Eosinophils Absolute: 0.2 10*3/uL (ref 0.0–0.7)
Folate: >20 ng/mL
GFR calc non Af Amer: 61.44 mL/min (ref >60)
GFR calc non Af Amer: 67.97 mL/min (ref >60)
Glucose, Bld: 110 mg/dL — ABNORMAL HIGH (ref 70–99)
Glucose, Bld: 116 mg/dL — ABNORMAL HIGH (ref 70–99)
HCT: 36 % — ABNORMAL LOW (ref 39.0–52.0)
Lymphs Abs: 1.2 10*3/uL (ref 0.7–4.0)
MCHC: 35 g/dL (ref 30.0–36.0)
MCV: 102.3 fL — ABNORMAL HIGH (ref 78.0–100.0)
Monocytes Absolute: 0.7 10*3/uL (ref 0.1–1.0)
Neutro Abs: 4.4 10*3/uL (ref 1.4–7.7)
POC INR: 1.7
Platelets: 183 10*3/uL (ref 150.0–400.0)
Potassium: 4.3 meq/L (ref 3.5–5.1)
Prothrombin Time: 29.7 s — ABNORMAL HIGH (ref 9.1–11.7)
RDW: 12.7 % (ref 11.5–14.6)
Sodium: 140 meq/L (ref 135–145)
T3, Free: 2.5 pg/mL (ref 2.3–4.2)
TSH: 1.13 microintl units/mL (ref 0.35–5.50)
Total Bilirubin: 1.1 mg/dL (ref 0.3–1.2)

## 2011-01-01 ENCOUNTER — Telehealth: Payer: Self-pay | Admitting: Internal Medicine

## 2011-01-01 NOTE — Medication Information (Signed)
Summary: rov/cb  Anticoagulant Therapy  Managed by: Weston Brass, PharmD PCP: Newt Lukes MD Supervising MD: Gala Romney MD, Reuel Boom Indication 1: Atrial Fibrillation (ICD-427.31) Lab Used: [facility] Site: [facility] INR POC 1.7 INR RANGE 2 - 3  Dietary changes: no    Health status changes: no    Bleeding/hemorrhagic complications: no    Recent/future hospitalizations: no    Any changes in medication regimen? no    Recent/future dental: no  Any missed doses?: no       Is patient compliant with meds? yes       Allergies: 1)  ! Pcn  Anticoagulation Management History:      The patient is taking warfarin and comes in today for a routine follow up visit.  Positive risk factors for bleeding include an age of 75 years or older.  The bleeding index is 'intermediate risk'.  Positive CHADS2 values include History of CHF, History of HTN, and Age > 70 years old.  The start date was 01/30/2006.  His last INR was 2.9 ratio.  Anticoagulation responsible provider: Turner Baillie MD, Reuel Boom.  INR POC: 1.7.  Exp: 04/2011.    Anticoagulation Management Assessment/Plan:      The patient's current anticoagulation dose is Coumadin 5 mg tabs: Take as directed by coumadin clinic..  The target INR is 2.0-3.0.  The next INR is due 04/18/2010.  Anticoagulation instructions were given to patient/wife.  Results were reviewed/authorized by Weston Brass, PharmD.  He was notified by Weston Brass PharmD.         Prior Anticoagulation Instructions: INR 2.0. Take 0.5 tablet daily except 1 tablet on Mon, Wed, Fri. Recheck in 2 weeks (same day as appointment with Dr. Gala Romney).  Current Anticoagulation Instructions: INR 1.7  Take 1 tablet today and tomorrow then resume same dose of 1/2 tablet every day except 1 tablet on Monday, Wednesday and Friday

## 2011-01-01 NOTE — Letter (Signed)
Summary: Implantable Device Instructions  Architectural technologist, Main Office  [address]   [place], [place] [address]   Phone: [phone]  Fax: [phone]      Implantable Device Instructions  You are scheduled for:   _____ Generator Change--to a BI-V Pacer  on 10/31/10 with Dr. Ladona Ridgel.  1.  Please arrive at the [facility] Stay [facility] at [facility] at 6:45am on the day of your procedure.  2.  Do not eat or drink after midnight the night before your procedure.  3.  Complete lab work on 10/23/10 at 2pm.    You do not have to be fasting.  4.  Do NOT take these medications for the morning of  your procedure:  Furosemide.  Take your last dose of Coumadin on ---will let you know after labs if you need to hold.  5.  Plan for an overnight stay.  Bring your insurance cards and a list of your medications.  6.  Wash your chest and neck with antibacterial soap (any brand) the evening before and the morning of your procedure.  Rinse well.   *If you have ANY questions after you get home, please call the office [phone].  Anselm Pancoast  *Every attempt is made to prevent procedures from being rescheduled.  Due to the nauture of Electrophysiology, rescheduling can happen.  The physician is always aware and directs the staff when this occurs.

## 2011-01-01 NOTE — Medication Information (Signed)
Summary: rov-tp  Anticoagulant Therapy  Managed by: Weston Brass, PharmD PCP: Newt Lukes MD Supervising MD: Daleen Squibb MD, Maisie Fus Indication 1: Atrial Fibrillation (ICD-427.31) Lab Used: [facility] Site: [facility] INR POC 2.0 INR RANGE 2 - 3  Dietary changes: no    Health status changes: no    Bleeding/hemorrhagic complications: no    Recent/future hospitalizations: no    Any changes in medication regimen? no    Recent/future dental: no  Any missed doses?: no       Is patient compliant with meds? yes       Allergies: 1)  ! Pcn  Anticoagulation Management History:      The patient is taking warfarin and comes in today for a routine follow up visit.  Positive risk factors for bleeding include an age of 75 years or older.  The bleeding index is 'intermediate risk'.  Positive CHADS2 values include History of CHF, History of HTN, and Age > 40 years old.  The start date was 01/30/2006.  His last INR was 2.9 ratio.  Anticoagulation responsible provider: Daleen Squibb MD, Maisie Fus.  INR POC: 2.0.  Cuvette Lot#: [id number].  Exp: 09/2011.    Anticoagulation Management Assessment/Plan:      The patient's current anticoagulation dose is Coumadin 5 mg tabs: Take as directed by coumadin clinic..  The target INR is 2.0-3.0.  The next INR is due 07/31/2010.  Anticoagulation instructions were given to patient/wife.  Results were reviewed/authorized by Weston Brass, PharmD.  He was notified by Liana Gerold, PharmD Candidate.         Current Anticoagulation Instructions: INR 2.0  Continue with 1 tablet daily except 1/2 tablet Mon and Thu. Return to clinic in 3 weeks.

## 2011-01-01 NOTE — Assessment & Plan Note (Signed)
Summary: 2 month rov/sl   Primary Provider:  Newt Lukes MD   History of Present Illness: [name] is a delightful 77 year old male with history congestive heart failure secondary to ischemic cardiomyopathy.  He has a totally occluded LAD on cardiac catheterization with nonobstructive disease elsewhere.  His EF previously in the 25-30% range. [facility]vere echo earlier this month (04/2010) showed EF 45% with mod-sev MR. He is status post BiV ICD.  He has also had problems with atrial fibrillation and undergone several cardioversions but now with chronic atrial fib.   Had 2-3 epsiodes where he said he lost his eysight in both eyes transiently and then came right back. No other neuro symptoms. Saw opthomalogist and they were wondering if he had minor TIA. Didn't feel lightheaded. Last episode 2 weeks ago.   Watching weight very closely and uses sliding scale lasix effectively. Volume status has been very good no. No CP, orthopnea, PND or edema. Weight very stable. No ICD firings  ICD interrogated in clinic: Confirms atiral fib. Optivol remains flat. HR 60-70s. Activity index low 1-2 hrs/day  Current Medications (verified): 1)  Lasix 40 Mg Tabs (Furosemide) .Marland Kitchen.. 1 Once Daily 2)  Zyrtec Allergy 10 Mg Tabs (Cetirizine Hcl) .Marland Kitchen.. 1 Qd 3)  Aspirin 81 Mg  Tbec (Aspirin) .... One By Mouth Every Day 4)  Ocuvite  Tabs (Multiple Vitamins-Minerals) .Marland Kitchen.. 1 Once Daily 5)  Potassium Chloride 20 Meq  Pack (Potassium Chloride) .Marland Kitchen.. 1 Once Daily 6)  Lisinopril 10 Mg  Tabs (Lisinopril) .... Take 1/2  Tab By Mouth Daily 7)  Omeprazole 20 Mg Tbec (Omeprazole) .Marland Kitchen.. 1 Once Daily 8)  Flomax 0.4 Mg Cp24 (Tamsulosin Hcl) .... Take 2 Capsule By Mouth Daily 9)  Finasteride 5 Mg Tabs (Finasteride) .Marland Kitchen.. 1 Once Daily 10)  Melatonin 3 Mg Caps (Melatonin) .... At Bedtime 11)  Pravastatin Sodium 40 Mg Tabs (Pravastatin Sodium) .... Take One Tablet By Mouth Daily At Bedtime 12)  Coumadin 5 Mg Tabs (Warfarin Sodium) ....  Take As Directed By Coumadin Clinic. 13)  Vitamin D (Ergocalciferol) 50000 Unit Caps (Ergocalciferol) .... Take 1 Q Friday 14)  Clonazepam 0.5 Mg Tabs (Clonazepam) .... 1/2-1 Tab By Mouth  Two Times A Day As Needed 15)  Mirtazapine 15 Mg Tabs (Mirtazapine) .... 1/2 Tab At Bedtime As Needed 16)  Astelin 137 Mcg/spray Soln (Azelastine Hcl) .Marland Kitchen.. 1 Spray Each Nostril  Every Morning 17)  Fluticasone Propionate 50 Mcg/act Susp (Fluticasone Propionate) .Marland Kitchen.. 1 Spray Each Nostril  Every Morning 18)  Systane 0.4-0.3 % Soln (Polyethyl Glycol-Propyl Glycol) .... Left Eye At Bedtime 19)  Doxazosin Mesylate 4 Mg Tabs (Doxazosin Mesylate) .Marland Kitchen.. 1 At Bedtime - May Try Stopping This Med For 1-2 Nights and See What Happens With Your Dry Mouth and Bladder  Allergies (verified): 1)  ! Pcn  Past History:  Past Medical History: Last updated: 02/14/2010  1. CHF due to ischemic CM          a. Echo 8/08 EF: 20-30% moderate to severe MR, mild AI  2. Atrial fib -anticoag         a. Amiodarone stopped due to recurrent AF 1/11  3.  CAD         a. s/p cath 9/06 chronic total occlusion LAD with right-to-left and left-to-left collaterals,               minimal nonobstructive CAD, in LCX and RCA. EF was 45% at that time.  4. Moderate to severe, MR  5. History of anemia secondary to GI bleed.  6. History of symptomatic bradycardia         a. s/p BiV ICD  7. Hypertension.  8. Hyperlipidemia  9. Retinal bleeding after cataract removal 10. Diverticulitis. 11. Osteoporosis with severe kyphosis 12. BPH 13. Hiatal hernia. 14. Glaucoma. 15. Post-op urinary retention  MD rooster: cards - [email or web address]  Review of Systems       As per HPI and past medical history; otherwise all systems negative.   Vital Signs:  Patient profile:   77 year old male Weight:      165 pounds Pulse rate:   80 / minute BP sitting:   104 / 58  (left arm)  Vitals Entered By: Meredith Staggers, RN (June 07, 2010 2:57 PM)  Physical  Exam  General:  frail kyphotic. no resp difficulty HEENT: normal Neck: supple. JVP 6 Carotids 2+ bilat; no bruits. No lymphadenopathy or thryomegaly appreciated. Cor: PMI nondisplaced. Regular rate & rhythm. No rubs, gallops,3/6 SEM at apex Lungs: clear Abdomen: soft, nontender, nondistended. No hepatosplenomegaly. No bruits or masses. Good bowel sounds. Extremities: no cyanosis, clubbing, rash, edema Neuro: alert & orientedx3, cranial nerves grossly intact. moves all 4 extremities w/o difficulty. affect pleasant. no drift.       ICD Specifications Following MD:  Lewayne Bunting, MD     Referring MD:  [facility] ICD Vendor:  [employer]     ICD Model Number:  [phone]     ICD Serial Number:  [id number] ICD DOI:  09/05/2006     ICD Implanting MD:  Lewayne Bunting, MD  Lead 1:    Location: RA     DOI: 08/14/2005     Model #: [id number]     Serial #: [id number]     Status: active Lead 2:    Location: RV     DOI: 09/05/2006     Model #: [id number]     Serial #: [id number]     Status: active Lead 3:    Location: LV     DOI: 09/05/2006     Model #: [id number]     Serial #: [id number]     Status: active Lead 4:    Location: RA     DOI: 08/14/2005     Model #: [id number]     Serial #: [id number]     Status: capped  Indications::  ICM, CHF   ICD Follow Up Remote Check?  No Battery Voltage:  2.74 V     Charge Time:  10.9 seconds     ICD Dependent:  Yes       ICD Device Measurements Atrium:  Impedance: 424 ohms,  Right Ventricle:  Impedance: 472 ohms,  Left Ventricle:  Impedance: 332 ohms,  Shock Impedance: 42/52 ohms   Episodes MS Episodes:  24     Percent Mode Switch:  100%     Coumadin:  Yes Shock:  0     ATP:  0     Nonsustained:  0      Brady Parameters Mode DDDR     Lower Rate Limit:  70     Upper Rate Limit 130 PAV 130     Sensed AV Delay:  100  Tachy Zones VF:  200     VT:  250 FVT VIA VF     VT1:  200     Tech Comments:  No parameter changes.  Interrogation only for Dr. Gala Romney.  Optivol and thoracic  impedance normal.  Follow up as scheduled. Altha Harm, LPN  June 07, 1609 2:57 PM   Impression & Recommendations:  Problem # 1:  SYSTOLIC HEART FAILURE, CHRONIC (ICD-428.22) EF improved on recent echo with EF 45%. Symptomatically doing very well. Volume status looks good.  Unbale to tolerate b-blocker due to severe side effects.  Problem # 2:  CAD, NATIVE VESSEL (ICD-414.01) Stable. No evidence of ischemia. Continue current regimen.  Problem # 3:  ATRIAL FIBRILLATION (ICD-427.31) Chronic. Continue coumadin. Has failed amiodarone so continuing with rate control strategy.  Problem # 4:  Transient visual loss Not sure what to make of this. No amaurosis. Given no assoicated symptoms and the fact that they recovered in seconds I doubt TIA. Will check ICD to make sure no arrhtymias. If happens again will CT brain and send to neuro.   Patient Instructions: 1)  If you have another spell please call me and we will arrange a CT scan 2)  Your physician wants you to follow-up in:  4 months.  You will receive a reminder letter in the mail two months in advance. If you don't receive a letter, please call our office to schedule the follow-up appointment.

## 2011-01-01 NOTE — Progress Notes (Signed)
Summary: omeprazole  Phone Note Refill Request Message from:  Fax from Pharmacy on May 30, 2010 2:30 PM  Refills Requested: Medication #1:  OMEPRAZOLE 20 MG TBEC 1 once daily [facility] [phone] last ov 02/14/10  Next Appointment Scheduled: 08/21/10 Initial call taken by: Orlan Leavens,  May 30, 2010 2:31 PM    Prescriptions: OMEPRAZOLE 20 MG TBEC (OMEPRAZOLE) 1 once daily  #30 x 3   Entered by:   Orlan Leavens   Authorized by:   Newt Lukes MD   Signed by:   Orlan Leavens on 05/30/2010   Method used:   Electronically to        [facility] (retail)       [address]       [place], [place]  [address]       Ph: [phone] or [phone]       Fax: [phone]   RxID:   [id number]

## 2011-01-01 NOTE — Medication Information (Signed)
Summary: [name]  Anticoagulant Therapy  Managed by: Bethena Midget, RN, BSN PCP: Newt Lukes MD Supervising MD: Ladona Ridgel MD, Sharlot Gowda Indication 1: Atrial Fibrillation (ICD-427.31) Lab Used: [facility] Site: [facility] INR POC 2.2 INR RANGE 2 - 3  Dietary changes: no    Health status changes: no    Bleeding/hemorrhagic complications: no    Recent/future hospitalizations: no    Any changes in medication regimen? no    Recent/future dental: no  Any missed doses?: no       Is patient compliant with meds? yes       Allergies: 1)  ! Pcn  Anticoagulation Management History:      The patient is taking warfarin and comes in today for a routine follow up visit.  Positive risk factors for bleeding include an age of 75 years or older.  The bleeding index is 'intermediate risk'.  Positive CHADS2 values include History of CHF, History of HTN, and Age > 17 years old.  The start date was 01/30/2006.  His last INR was 2.9 ratio.  Anticoagulation responsible provider: Ladona Ridgel MD, Sharlot Gowda.  INR POC: 2.2.  Cuvette Lot#: [id number].  Exp: 10/2011.    Anticoagulation Management Assessment/Plan:      The patient's current anticoagulation dose is Coumadin 5 mg tabs: Take as directed by coumadin clinic..  The target INR is 2.0-3.0.  The next INR is due 09/26/2010.  Anticoagulation instructions were given to patient/wife.  Results were reviewed/authorized by Bethena Midget, RN, BSN.  He was notified by Bethena Midget, RN, BSN.         Prior Anticoagulation Instructions: INR 2.1  Continue taking 1 tablet (5mg ) every day except take 1/2 tablet (2.5mg ) on Mondays and Thursdays.  Recheck in 4 weeks.   Current Anticoagulation Instructions: INR 2.2 Continue 5mg s daily except 2.5mg s on Mondays and Thursdays. Recheck in 4 weeks.

## 2011-01-01 NOTE — Progress Notes (Signed)
Summary: lasix refill - ?dose  Phone Note Refill Request Message from:  Fax from Pharmacy on July 03, 2010 10:15 AM  Refills Requested: Medication #1:  LASIX 40 MG TABS take 2 q am and 1 every evening # 90   Last Ref[facility]: [facility]/2010 [facility] [phone] Last ov 02/14/10  Next Appointment Scheduled: 08/21/10 Initial call taken by: Orlan Leavens RMA,  July 03, 2010 10:16 AM  Follow-up for Phone Call        Per our chart should be once daily. Is this ok to send with new directions Follow-up by: Orlan Leavens RMA,  July 03, 2010 10:18 AM  Additional Follow-up for Phone Call Additional follow up Details #1::        ok to send as once daily - thx Additional Follow-up by: Newt Lukes MD,  July 03, 2010 10:31 AM    Additional Follow-up for Phone Call Additional follow up Details #2::    [facility] Also called pt to let him know md wanted him to take just 1 tablet daily. Pt states he has been until he start getting alot of fluid then he take another 1 as needed  Follow-up by: Orlan Leavens RMA,  July 03, 2010 11:24 AM  Prescriptions: LASIX 40 MG TABS (FUROSEMIDE) 1 once daily  #30 x 3   Entered by:   Orlan Leavens RMA   Authorized by:   Newt Lukes MD   Signed by:   Orlan Leavens RMA on 07/03/2010   Method used:   Electronically to        [facility] (retail)       [address]       [place], [place]  [address]       Ph: [phone] or [phone]       Fax: [phone]   RxID:   [id number]

## 2011-01-01 NOTE — Cardiovascular Report (Signed)
Summary: Office Visit Remote   Office Visit Remote   Imported By: Roderic Ovens 12/20/2009 12:06:24  _____________________________________________________________________  External Attachment:    Type:   Image     Comment:   External Document

## 2011-01-01 NOTE — Medication Information (Signed)
Summary: rov/sp  Anticoagulant Therapy  Managed by: Bethena Midget, RN, BSN PCP: Newt Lukes MD Supervising MD: Juanda Chance MD, Abdulmalik Darco Indication 1: Atrial Fibrillation (ICD-427.31) Lab Used: [facility] Site: [facility] INR POC 2.1 INR RANGE 2 - 3  Dietary changes: no    Health status changes: no    Bleeding/hemorrhagic complications: no    Recent/future hospitalizations: no    Any changes in medication regimen? no    Recent/future dental: no  Any missed doses?: no       Is patient compliant with meds? yes       Allergies: 1)  ! Pcn  Anticoagulation Management History:      The patient is taking warfarin and comes in today for a routine follow up visit.  Positive risk factors for bleeding include an age of 75 years or older.  The bleeding index is 'intermediate risk'.  Positive CHADS2 values include History of CHF, History of HTN, and Age > 24 years old.  The start date was 01/30/2006.  His last INR was 2.9 ratio.  Anticoagulation responsible provider: Juanda Chance MD, Smitty Cords.  INR POC: 2.1.  Cuvette Lot#: [id number].  Exp: 07/2011.    Anticoagulation Management Assessment/Plan:      The patient's current anticoagulation dose is Coumadin 5 mg tabs: Take as directed by coumadin clinic..  The target INR is 2.0-3.0.  The next INR is due 06/07/2010.  Anticoagulation instructions were given to patient/wife.  Results were reviewed/authorized by Bethena Midget, RN, BSN.  He was notified by Bethena Midget, RN, BSN.         Prior Anticoagulation Instructions: INR 1.8  Take 1 1/2 tablets today then increase dose to 1 tablet every day except 1/2 tablet on Sunday, Tuesday and Thursday.   Current Anticoagulation Instructions: INR 2.1 Continue 5mg s everyday except 2.5mg s on Tuesdays, Thursdays and Sundays. Recheck in 4 weeks, ok'd by pharm d.

## 2011-01-01 NOTE — Medication Information (Signed)
Summary: rov/tm  Anticoagulant Therapy  Managed by: Elaina Pattee, PharmD PCP: Newt Lukes MD Supervising MD: Gala Romney MD, Reuel Boom Indication 1: Atrial Fibrillation (ICD-427.31) Lab Used: [facility]  Site: [facility] INR POC 2.0 INR RANGE 2 - 3  Dietary changes: no    Health status changes: no    Bleeding/hemorrhagic complications: no    Recent/future hospitalizations: no    Any changes in medication regimen? no    Recent/future dental: no  Any missed doses?: no       Is patient compliant with meds? yes       Allergies: 1)  ! Pcn  Anticoagulation Management History:      The patient is taking warfarin and comes in today for a routine follow up visit.  Positive risk factors for bleeding include an age of 75 years or older.  The bleeding index is 'intermediate risk'.  Positive CHADS2 values include History of CHF, History of HTN, and Age > 75 years old.  The start date was 01/30/2006.  His last INR was 2.9 ratio.  Anticoagulation responsible provider: Bensimhon MD, Reuel Boom.  INR POC: 2.0.  Cuvette Lot#: [id number].  Exp: 04/2011.    Anticoagulation Management Assessment/Plan:      The patient's current anticoagulation dose is Coumadin 5 mg tabs: Take as directed by coumadin clinic..  The target INR is 2.0-3.0.  The next INR is due 03/29/2010.  Anticoagulation instructions were given to patient/wife.  Results were reviewed/authorized by Elaina Pattee, PharmD.  He was notified by Elaina Pattee, PharmD.         Prior Anticoagulation Instructions: INR 2.5 Continue 2.5mg  daily except 5mg s on Mondays, Wednesdays, and Fridays. Recheck in 4 weeks.   Current Anticoagulation Instructions: INR 2.0. Take 0.5 tablet daily except 1 tablet on Mon, Wed, Fri. Recheck in 2 weeks (same day as appointment with Dr. Gala Romney).

## 2011-01-01 NOTE — Assessment & Plan Note (Signed)
Summary: spitting up green fluid/cough/lb   Vital Signs:  Patient profile:   77 year old male Height:      68 inches (172.72 cm) Weight:      166.0 pounds (75.45 kg) O2 Sat:      92 % on Room air Temp:     97.4 degrees F (36.33 degrees C) oral Pulse rate:   82 / minute BP sitting:   110 / 62  (left arm) Cuff size:   regular  Vitals Entered By: Orlan Leavens RMA (September 17, 2010 2:44 PM)  O2 Flow:  Room air CC: Cough/ coughing up green phlegm, URI symptoms Is Patient Diabetic? No Pain Assessment Patient in pain? no        Primary Care Provider:  Newt Lukes MD  CC:  Cough/ coughing up green phlegm and URI symptoms.  History of Present Illness:  URI Symptoms      This is an 77 year old man who presents with URI symptoms.  The symptoms began 1 week ago.  The severity is described as moderate.  using benadryl for runny nose symptoms - causes sedation and mild confusion per family.  The patient reports nasal congestion, productive cough, and sick contacts, but denies purulent nasal discharge, sore throat, and earache.  The patient denies fever, stiff neck, dyspnea, wheezing, diarrhea, and use of an antipyretic.  The patient also reports sneezing and severe fatigue.  The patient denies headache and muscle aches.  The patient denies the following risk factors for Strep sinusitis: tooth pain, Strep exposure, tender adenopathy, and absence of cough.    reviewed chronic med issues: 1) dyslipidemia - reports compliance with ongoing medical treatment and no changes in medication dose or frequency. denies adverse side effects related to current therapy. no G or muscle c/o  2) HTN - reports compliance with ongoing medical treatment and no changes in medication dose or frequency. denies adverse side effects related to current therapy. no HA or vision changes, no weakness  3) chronic CHF - reports compliance with ongoing medical treatment and no changes in medication dose or  frequency. denies adverse side effects related to current therapy. weight stable - no swelling of legs or SOB  4) PAF -  changes in amio reviewed (now off amio, rate control only) -  no bleeding with anticoag; no changein bruising -    Clinical Review Panels:  Lipid Management   Cholesterol:  181 (11/08/2009)   LDL (bad choesterol):  89 (11/08/2009)   HDL (good cholesterol):  78.60 (11/08/2009)   Triglycerides:  109 (06/01/2008)  CBC   WBC:  6.0 (03/28/2010)   RBC:  3.56 (03/28/2010)   Hgb:  12.6 (03/28/2010)   Hct:  36.0 (03/28/2010)   Platelets:  190.0 (03/28/2010)   MCV  101.4 (03/28/2010)   MCHC  34.9 (03/28/2010)   RDW  14.1 (03/28/2010)   PMN:  58.8 (03/28/2010)   Lymphs:  28.8 (03/28/2010)   Monos:  10.8 (03/28/2010)   Eosinophils:  1.1 (03/28/2010)   Basophil:  0.5 (03/28/2010)  Complete Metabolic Panel   Glucose:  123 (03/28/2010)   Sodium:  143 (03/28/2010)   Potassium:  4.1 (03/28/2010)   Chloride:  102 (03/28/2010)   CO2:  32 (03/28/2010)   BUN:  24 (03/28/2010)   Creatinine:  1.2 (03/28/2010)   Albumin:  4.4 (11/08/2009)   Total Protein:  7.2 (11/08/2009)   Calcium:  9.3 (03/28/2010)   Total Bili:  1.0 (11/08/2009)   Alk Phos:  43 (  11/08/2009)   SGPT (ALT):  20 (11/08/2009)   SGOT (AST):  28 (11/08/2009)   Current Medications (verified): 1)  Lasix 40 Mg Tabs (Furosemide) .Marland Kitchen.. 1 Once Daily 2)  Zyrtec Allergy 10 Mg Tabs (Cetirizine Hcl) .Marland Kitchen.. 1 Qd 3)  Aspirin 81 Mg  Tbec (Aspirin) .... One By Mouth Every Day 4)  Ocuvite  Tabs (Multiple Vitamins-Minerals) .Marland Kitchen.. 1 Once Daily 5)  Potassium Chloride 20 Meq  Pack (Potassium Chloride) .Marland Kitchen.. 1 Once Daily 6)  Lisinopril 10 Mg  Tabs (Lisinopril) .... Take 1/2  Tab By Mouth Daily 7)  Omeprazole 20 Mg Tbec (Omeprazole) .Marland Kitchen.. 1 Once Daily 8)  Finasteride 5 Mg Tabs (Finasteride) .Marland Kitchen.. 1 Once Daily 9)  Melatonin 3 Mg Caps (Melatonin) .... At Bedtime 10)  Pravastatin Sodium 40 Mg Tabs (Pravastatin Sodium) .... Take One  Tablet By Mouth Daily At Bedtime 11)  Coumadin 5 Mg Tabs (Warfarin Sodium) .... Take As Directed By Coumadin Clinic. 12)  Vitamin D (Ergocalciferol) 50000 Unit Caps (Ergocalciferol) .... Take 1 Q Friday 13)  Clonazepam 0.5 Mg Tabs (Clonazepam) .... 1/2-1 Tab By Mouth  Two Times A Day As Needed 14)  Mirtazapine 15 Mg Tabs (Mirtazapine) .... 1/2 Tab At Bedtime As Needed 15)  Astelin 137 Mcg/spray Soln (Azelastine Hcl) .Marland Kitchen.. 1 Spray Each Nostril  Every Morning 16)  Fluticasone Propionate 50 Mcg/act Susp (Fluticasone Propionate) .Marland Kitchen.. 1 Spray Each Nostril  Every Morning 17)  Systane 0.4-0.3 % Soln (Polyethyl Glycol-Propyl Glycol) .... Left Eye At Bedtime 18)  Doxazosin Mesylate 4 Mg Tabs (Doxazosin Mesylate) .Marland Kitchen.. 1 At Bedtime 19)  Flomax 0.4 Mg Caps (Tamsulosin Hcl) .... Take 2 By Mouth Once Daily  Allergies (verified): 1)  ! Pcn  Past History:  Past Medical History:  1. CHF due to ischemic CM          a. Echo 8/08 EF: 20-30% moderate to severe MR, mild AI  2. Atrial fib -anticoag         a. Amiodarone stopped due to recurrent AF 1/11  3.  CAD         a. s/p cath 9/06 chronic total occlusion LAD with right-to-left and left-to-left collaterals,               minimal nonobstructive CAD, in LCX and RCA. EF was 45% at that time.  4. Moderate to severe, MR  5. History of anemia secondary to GI bleed.  6. History of symptomatic bradycardia         a. s/p BiV ICD  7. Hypertension.  8. Hyperlipidemia  9. Retinal bleeding after cataract removal 10. Diverticulitis. 11. Osteoporosis with severe kyphosis 12. BPH 13. Hiatal hernia. 14. Glaucoma. 15. Post-op urinary retention  MD roster:   cards - benshimon  Review of Systems  The patient denies decreased hearing, hoarseness, chest pain, syncope, and abdominal pain.    Physical Exam  General:  frail kyphotic - alert, well-developed, well-nourished, and cooperative to examination.   wife and dtr at side Lungs:  normal respiratory effort,  no intercostal retractions or use of accessory muscles; diminshed breath sounds bilaterally at bases but no crackles and no wheezes.   few rhonchi at start Heart:  normal rate, regular rhythm, no murmur, and no rub. BLE with min edema, R 1+ and L trace (chronic R>L).    Impression & Recommendations:  Problem # 1:  ACUTE BRONCHITIS (ICD-466.0)  risk/benefit of abx tx discussed, esp with co-use of coumadin given green sputum, duration symptoms > 1  week and adv age, will carefull tx with short course abx - symptoms tx with robitussin advised and avoid use benadryl due to sedating effects - ok to use claritin occ for same symptoms  His updated medication list for this problem includes:    Azithromycin 250 Mg Tabs (Azithromycin) .Marland Kitchen... 2 tabs by mouth today, then 1 by mouth daily starting tomorrow  Take antibiotics and other medications as directed. Encouraged to push clear liquids, get enough rest, and take acetaminophen as needed. To be seen in 5-7 days if no improvement, sooner if worse.  Orders: Prescription Created Electronically [id number])  Complete Medication List: 1)  Lasix 40 Mg Tabs (Furosemide) .Marland Kitchen.. 1 once daily 2)  Zyrtec Allergy 10 Mg Tabs (Cetirizine hcl) .Marland Kitchen.. 1 qd 3)  Aspirin 81 Mg Tbec (Aspirin) .... One by mouth every day 4)  Ocuvite Tabs (Multiple vitamins-minerals) .Marland Kitchen.. 1 once daily 5)  Potassium Chloride 20 Meq Pack (Potassium chloride) .Marland Kitchen.. 1 once daily 6)  Lisinopril 10 Mg Tabs (Lisinopril) .... Take 1/2  tab by mouth daily 7)  Omeprazole 20 Mg Tbec (Omeprazole) .Marland Kitchen.. 1 once daily 8)  Finasteride 5 Mg Tabs (Finasteride) .Marland Kitchen.. 1 once daily 9)  Melatonin 3 Mg Caps (Melatonin) .... At bedtime 10)  Pravastatin Sodium 40 Mg Tabs (Pravastatin sodium) .... Take one tablet by mouth daily at bedtime 11)  Coumadin 5 Mg Tabs (Warfarin sodium) .... Take as directed by coumadin clinic. 12)  Vitamin D (ergocalciferol) 50000 Unit Caps (Ergocalciferol) .... Take 1 q friday 13)  Clonazepam  0.5 Mg Tabs (Clonazepam) .... 1/2-1 tab by mouth  two times a day as needed 14)  Mirtazapine 15 Mg Tabs (Mirtazapine) .... 1/2 tab at bedtime as needed 15)  Astelin 137 Mcg/spray Soln (Azelastine hcl) .Marland Kitchen.. 1 spray each nostril  every morning 16)  Fluticasone Propionate 50 Mcg/act Susp (Fluticasone propionate) .Marland Kitchen.. 1 spray each nostril  every morning 17)  Systane 0.4-0.3 % Soln (Polyethyl glycol-propyl glycol) .... Left eye at bedtime 18)  Doxazosin Mesylate 4 Mg Tabs (Doxazosin mesylate) .Marland Kitchen.. 1 at bedtime 19)  Flomax 0.4 Mg Caps (Tamsulosin hcl) .... Take 2 by mouth once daily 20)  Azithromycin 250 Mg Tabs (Azithromycin) .... 2 tabs by mouth today, then 1 by mouth daily starting tomorrow  Patient Instructions: 1)  it was good to see you today. 2)  stop benadryl - can use claritin as needed IF runny nose symptoms  3)  Zpack for antibioitcs - your prescription has been electronically submitted to your pharmacy. Please take as directed. Contact our office if you believe you're having problems with the medication(s).  watch caefully for bruising or bleeding while on antibioitc therapy and call coumaidn clinic of problems 4)  Robitussin for cough 5)  Get plenty of rest, drink lots of clear liquids, and use Tylenol for fever and comfort. Return in 7-10 days if you're not better:sooner if you're feeling worse. Prescriptions: AZITHROMYCIN 250 MG TABS (AZITHROMYCIN) 2 tabs by mouth today, then 1 by mouth daily starting tomorrow  #6 x 0   Entered and Authorized by:   Newt Lukes MD   Signed by:   Newt Lukes MD on 09/17/2010   Method used:   Electronically to        [id number] (retail)       [address]       [place], [place]  [address]       Ph: [phone] or [phone]  Fax: [phone]   RxID:   [id number]    Orders Added: 1)  Est. Patient Level IV [84696] 2)  Prescription Created Electronically [phone]

## 2011-01-01 NOTE — Assessment & Plan Note (Signed)
Summary: per check out/sf   Primary Provider:  Newt Lukes MD   History of Present Illness: [name] is a delightful 77 year old male with history congestive heart failure secondary to ischemic cardiomyopathy.  He has a totally occluded LAD on cardiac catheterization with nonobstructive disease elsewhere.  His EF is in the 25-30% range.  She is status post BiV ICD.  He has also had problems with atrial fibrillation and undergone several cardioversions.  He is currently maintained on amiodarone.   He underwent repeat cardioversion about 8 weeks ago. He comes in today as his pacer interrogation shows that he has been back in AF for the past 4 weeks. However Optivol fluid index has been flat. he says since his cardioversion as felt great with imrpovede exercise tolerance. He has not noticed any change over past 4 weeks. No palpitations, orthopnea, PND. Only complaint is persistent tremor. Weight very stable. No bleeding on coumadin.  Current Medications (verified): 1)  Amiodarone Hcl 200 Mg Tabs (Amiodarone Hcl) .... Take 1 Tablet By Mouth Once A Day 2)  Lasix 40 Mg Tabs (Furosemide) .Marland Kitchen.. 1 Once Daily 3)  Zyrtec Allergy 10 Mg Tabs (Cetirizine Hcl) .Marland Kitchen.. 1 Qd 4)  Aspirin 81 Mg  Tbec (Aspirin) .... One By Mouth Every Day 5)  Ocuvite  Tabs (Multiple Vitamins-Minerals) .Marland Kitchen.. 1 Once Daily 6)  Potassium Chloride 20 Meq  Pack (Potassium Chloride) .Marland Kitchen.. 1 Once Daily 7)  Lisinopril 10 Mg  Tabs (Lisinopril) .... Take 1/2  Tab By Mouth Daily 8)  Omeprazole 20 Mg Tbec (Omeprazole) .Marland Kitchen.. 1 Once Daily 9)  Flomax 0.4 Mg Cp24 (Tamsulosin Hcl) .... Take 2 Capsule By Mouth Daily 10)  Finasteride 5 Mg Tabs (Finasteride) .Marland Kitchen.. 1 Once Daily 11)  Melatonin 3 Mg Caps (Melatonin) .... At Bedtime 12)  Doxazosin Mesylate 4 Mg Tabs (Doxazosin Mesylate) .Marland Kitchen.. 1 At Bedtime 13)  Pravastatin Sodium 40 Mg Tabs (Pravastatin Sodium) .... Take One Tablet By Mouth Daily At Bedtime 14)  Coumadin 5 Mg Tabs (Warfarin Sodium) ....  Take As Directed By Coumadin Clinic. 15)  Vitamin D (Ergocalciferol) 50000 Unit Caps (Ergocalciferol) .... Take 1 Q Friday 16)  Clonazepam 0.5 Mg Tabs (Clonazepam) .... 1/2-1 Tab By Mouth  Two Times A Day As Needed 17)  Mirtazapine 15 Mg Tabs (Mirtazapine) .... 1/2 Tab At Bedtime As Needed 18)  Astelin 137 Mcg/spray Soln (Azelastine Hcl) .Marland Kitchen.. 1 Spray Each Nostril  Every Morning 19)  Fluticasone Propionate 50 Mcg/act Susp (Fluticasone Propionate) .Marland Kitchen.. 1 Spray Each Nostril  Every Morning  Allergies (verified): 1)  ! Pcn 2)  Penicillin V Potassium (Penicillin V Potassium)  Past History:  Past Medical History: Last updated: 08/30/2009  1. CHF due to ischemic CM          a. Echo 8/08 EF: 20-30% moderate to severe MR, mild AI  2. Atrial fib -anticoag         a. Maintaining sinus rhythm on amiodarone  3.  CAD         a. s/p cath 9/06 chronic total occlusion LAD with right-to-left and left-to-left collaterals,               minimal nonobstructive CAD, in LCX and RCA. EF was 45% at that time.  4. Moderate to severe, MR  5. History of anemia secondary to GI bleed.  6. History of symptomatic bradycardia         a. s/p BiV ICD  7. Hypertension.  8. Hyperlipidemia.  9. Retinal bleeding  after cataract removal 10. Diverticulitis. 11. Osteoporosis with severe kyphosis 12. BPH 13. Hiatal hernia. 14. Glaucoma. 15. Post-op urinary retention  Review of Systems       As per HPI and past medical history; otherwise all systems negative.   Vital Signs:  Patient profile:   77 year old male Height:      68 inches Weight:      166 pounds BMI:     25.33 Pulse rate:   62 / minute BP sitting:   140 / 76  (left arm) Cuff size:   regular  Vitals Entered By: Hardin Negus, RMA (December 21, 2009 3:19 PM)  Physical Exam  General:  General:  Gen: frail kyphotic. no resp difficulty HEENT: normal Neck: supple. JVP 5. Carotids 2+ bilat; no bruits. No lymphadenopathy or thryomegaly  appreciated. Cor: PMI nondisplaced. Regular rate & rhythm. No rubs, gallops,3/6 SEM at apex Lungs: clear Abdomen: soft, nontender, nondistended. No hepatosplenomegaly. No bruits or masses. Good bowel sounds. Extremities: no cyanosis, clubbing, rash, edema Neuro: alert & orientedx3, cranial nerves grossly intact. moves all 4 extremities w/o difficulty. affect pleasant     ICD Specifications Following MD:  Lewayne Bunting, MD     Referring MD:  [facility] ICD Vendor:  [employer]     ICD Model Number:  [id number]     ICD Serial Number:  [id number] ICD DOI:  09/05/2006     ICD Implanting MD:  Lewayne Bunting, MD  Lead 1:    Location: RA     DOI: 08/14/2005     Model #: 5621     Serial #: [id number]     Status: active Lead 2:    Location: RV     DOI: 09/05/2006     Model #: 6295     Serial #: [id number]     Status: active Lead 3:    Location: LV     DOI: 09/05/2006     Model #: 4194     Serial #: [id number]     Status: active Lead 4:    Location: RA     DOI: 08/14/2005     Model #: [id number]     Serial #: [id number]     Status: capped  Indications::  ICM, CHF   Episodes Coumadin:  Yes  Brady Parameters Mode DDDR     Lower Rate Limit:  60     Upper Rate Limit 139 PAV 130     Sensed AV Delay:  100  Tachy Zones VF:  200     VT:  250     VT1:  171     Impression & Recommendations:  Problem # 1:  ATRIAL FIBRILLATION (ICD-427.31) He remains asymptomatic despite relapsing into AF. I think the main reason he feels so good is the tight control of his volume status. We have discussed the options and have decided to trya nd pursue a rate control strategy. Will stop amiodarone and follow optivol index closely. Not candidate for AF ablation.  Problem # 2:  SYSTOLIC HEART FAILURE, CHRONIC (ICD-428.22) Well controlled. Continue current regimen.  Problem # 3:  CAD, NATIVE VESSEL (ICD-414.01) Stable. No evidence of ischemia. Continue current regimen.  Patient Instructions: 1)  Follow up in 3 weeks--Feb 10th at  3:15

## 2011-01-01 NOTE — Procedures (Signed)
Summary: Cardiology Device Clinic   Allergies: 1)  ! Pcn   ICD Specifications Following MD:  Lewayne Bunting, MD     Referring MD:  [facility] ICD Vendor:  [employer]     ICD Model Number:  [id number]     ICD Serial Number:  [id number] ICD DOI:  09/05/2006     ICD Implanting MD:  Lewayne Bunting, MD  Lead 1:    Location: RA     DOI: 08/14/2005     Model #: 1478     Serial #: [id number]     Status: active Lead 2:    Location: RV     DOI: 09/05/2006     Model #: 5784     Serial #: [id number]     Status: active Lead 3:    Location: LV     DOI: 09/05/2006     Model #: 4194     Serial #: [id number]     Status: active Lead 4:    Location: RA     DOI: 08/14/2005     Model #: 7253     Serial #: [id number]     Status: capped  Indications::  ICM, CHF   ICD Follow Up Remote Check?  No Battery Voltage:  2.83 V     Charge Time:  10.9 seconds     Underlying rhythm:  chb ICD Dependent:  Yes       ICD Device Measurements Atrium:  Amplitude: 1.5 mV, Impedance: 416 ohms,  Right Ventricle:  Amplitude: 4.3 mV, Impedance: 464 ohms, Threshold:  1.0 V at 0.4 msec Left Ventricle:  Impedance: 312 ohms, Threshold: 2.0 V at 1.2 msec Shock Impedance: 40/50 ohms   Episodes MS Episodes:  0     Percent Mode Switch:  0     Coumadin:  Yes Shock:  0     ATP:  0     Nonsustained:  0     Atrial Therapies:  0 Atrial Pacing:  1.9%     Ventricular Pacing:  99.9%  Brady Parameters Mode DDDR     Lower Rate Limit:  60     Upper Rate Limit 130 PAV 130     Sensed AV Delay:  100  Tachy Zones VF:  200     VT:  250 FVT VIA VF     VT1:  200     Next Cardiology Appt Due:  06/29/2010 Tech Comments:  PT IN AF 99.8% OF TIME.  NORMAL DEVICE FUNCTION.  PER DR BENSIMHON CHANGED LOWER RATE FROM 60 TO 70bpm. ROV IN  W/DR TAYLOR.  Gypsy Balsam RN BSN  March 28, 2010 3:17 PM MD Comments:  Agree with above.

## 2011-01-01 NOTE — Medication Information (Signed)
Summary: rov coumadin - [facility]  Anticoagulant Therapy  Managed by: Bethena Midget, RN, BSN PCP: Newt Lukes MD Supervising MD: Daleen Squibb MD, Maisie Fus Indication 1: Atrial Fibrillation (ICD-427.31) Lab Used: [facility] Site: [facility] INR POC 2.3 INR RANGE 2 - 3  Dietary changes: no    Health status changes: no    Bleeding/hemorrhagic complications: no    Recent/future hospitalizations: no    Any changes in medication regimen? no    Recent/future dental: no  Any missed doses?: no       Is patient compliant with meds? yes       Allergies: 1)  ! Pcn 2)  Penicillin V Potassium (Penicillin V Potassium)  Anticoagulation Management History:      The patient is taking warfarin and comes in today for a routine follow up visit.  Positive risk factors for bleeding include an age of 75 years or older.  The bleeding index is 'intermediate risk'.  Positive CHADS2 values include History of CHF, History of HTN, and Age > 10 years old.  The start date was 01/30/2006.  His last INR was 2.9 ratio.  Anticoagulation responsible provider: Daleen Squibb MD, Maisie Fus.  INR POC: 2.3.  Cuvette Lot#: [id number].  Exp: 03/2011.    Anticoagulation Management Assessment/Plan:      The patient's current anticoagulation dose is Coumadin 5 mg tabs: Take as directed by coumadin clinic..  The target INR is 2.0-3.0.  The next INR is due 02/08/2010.  Anticoagulation instructions were given to patient/wife.  Results were reviewed/authorized by Bethena Midget, RN, BSN.  He was notified by Bethena Midget, RN, BSN.         Prior Anticoagulation Instructions: INR 3.5  No coumadin today Thur 1/20 Then continue coumadin 1/2 tab = 2.5mg  Tu, Thu, Sat and Sun 1 tab = 5mg  on Mon, Wed, Fri  Current Anticoagulation Instructions: INR 2.3 Continue 1/2 pill everyday except 1 pill on Mondays, Wednesdays and Fridays. Recheck in 4 weeks.

## 2011-01-01 NOTE — Progress Notes (Signed)
Summary: mirtazaprine  Phone Note Refill Request Message from:  Fax from Pharmacy on July 11, 2010 4:21 PM  Refills Requested: Medication #1:  MIRTAZAPINE 15 MG TABS 1/2 tab at bedtime as needed   Last Refilled: 06/20/2010  Method Requested: Electronic Initial call taken by: Orlan Leavens RMA,  July 11, 2010 4:21 PM    Prescriptions: MIRTAZAPINE 15 MG TABS (MIRTAZAPINE) 1/2 tab at bedtime as needed  #10 x 3   Entered by:   Orlan Leavens RMA   Authorized by:   Newt Lukes MD   Signed by:   Orlan Leavens RMA on 07/11/2010   Method used:   Electronically to        [id number]       [address]       [place], [place]  [address]       Ph: [phone] or [phone]       Fax: [phone]   RxID:   [id number]

## 2011-01-01 NOTE — Assessment & Plan Note (Signed)
Summary: f3w   Primary Provider:  Newt Lukes MD  CC:  no complaints.  History of Present Illness: [name] is a delightful 77 year old male with history congestive heart failure secondary to ischemic cardiomyopathy.  He has a totally occluded LAD on cardiac catheterization with nonobstructive disease elsewhere.  His EF is in the 25-30% range.  She is status post BiV ICD.  He has also had problems with atrial fibrillation and undergone several cardioversions.  He is currently maintained on amiodarone.   He underwent repeat cardioversion in October 2010. Recently found to be back in AF on his device interrogation. We saw him a few weeks ago and talked about about of repeat DC-CV versus switching to rate control strategy. We decided to proceed with rate control  Overall doing well. Doesn't notice any change from being in sinus. Many more good days than basd. Does get frustrated due to tremor when getting dressed. Says he only had 1 bad day where he was very fatigued in past month. No CP, orthopnea, PND or edema. Weight very stable. No ICD firings  ICD interrogated in clinic: Confirms atiral fib. Optivol remains flat.   Current Medications (verified): 1)  Lasix 40 Mg Tabs (Furosemide) .Marland Kitchen.. 1 Once Daily 2)  Zyrtec Allergy 10 Mg Tabs (Cetirizine Hcl) .Marland Kitchen.. 1 Qd 3)  Aspirin 81 Mg  Tbec (Aspirin) .... One By Mouth Every Day 4)  Ocuvite  Tabs (Multiple Vitamins-Minerals) .Marland Kitchen.. 1 Once Daily 5)  Potassium Chloride 20 Meq  Pack (Potassium Chloride) .Marland Kitchen.. 1 Once Daily 6)  Lisinopril 10 Mg  Tabs (Lisinopril) .... Take 1/2  Tab By Mouth Daily 7)  Omeprazole 20 Mg Tbec (Omeprazole) .Marland Kitchen.. 1 Once Daily 8)  Flomax 0.4 Mg Cp24 (Tamsulosin Hcl) .... Take 2 Capsule By Mouth Daily 9)  Finasteride 5 Mg Tabs (Finasteride) .Marland Kitchen.. 1 Once Daily 10)  Melatonin 3 Mg Caps (Melatonin) .... At Bedtime 11)  Doxazosin Mesylate 4 Mg Tabs (Doxazosin Mesylate) .Marland Kitchen.. 1 At Bedtime 12)  Pravastatin Sodium 40 Mg Tabs  (Pravastatin Sodium) .... Take One Tablet By Mouth Daily At Bedtime 13)  Coumadin 5 Mg Tabs (Warfarin Sodium) .... Take As Directed By Coumadin Clinic. 14)  Vitamin D (Ergocalciferol) 50000 Unit Caps (Ergocalciferol) .... Take 1 Q Friday 15)  Clonazepam 0.5 Mg Tabs (Clonazepam) .... 1/2-1 Tab By Mouth  Two Times A Day As Needed 16)  Mirtazapine 15 Mg Tabs (Mirtazapine) .... 1/2 Tab At Bedtime As Needed 17)  Astelin 137 Mcg/spray Soln (Azelastine Hcl) .Marland Kitchen.. 1 Spray Each Nostril  Every Morning 18)  Fluticasone Propionate 50 Mcg/act Susp (Fluticasone Propionate) .Marland Kitchen.. 1 Spray Each Nostril  Every Morning 19)  Systane 0.4-0.3 % Soln (Polyethyl Glycol-Propyl Glycol) .... Left Eye At Bedtime  Allergies: 1)  ! Pcn  Past History:  Past Medical History:  1. CHF due to ischemic CM          a. Echo 8/08 EF: 20-30% moderate to severe MR, mild AI  2. Atrial fib -anticoag         a. Amiodarone stopped due to recurrent AF 1/11  3.  CAD         a. s/p cath 9/06 chronic total occlusion LAD with right-to-left and left-to-left collaterals,               minimal nonobstructive CAD, in LCX and RCA. EF was 45% at that time.  4. Moderate to severe, MR  5. History of anemia secondary to GI bleed.  6. History  of symptomatic bradycardia         a. s/p BiV ICD  7. Hypertension.  8. Hyperlipidemia.  9. Retinal bleeding after cataract removal 10. Diverticulitis. 11. Osteoporosis with severe kyphosis 12. BPH 13. Hiatal hernia. 14. Glaucoma. 15. Post-op urinary retention  Review of Systems       As per HPI and past medical history; otherwise all systems negative.   Vital Signs:  Patient profile:   77 year old male Height:      68 inches Weight:      167 pounds BMI:     25.48 Pulse rate:   65 / minute BP sitting:   108 / 62  (left arm) Cuff size:   regular  Vitals Entered By: Hardin Negus, RMA (January 11, 2010 3:23 PM)  Physical Exam  General:  General:  Gen: frail kyphotic. no resp  difficulty HEENT: normal Neck: supple. JVP 5. Carotids 2+ bilat; no bruits. No lymphadenopathy or thryomegaly appreciated. Cor: PMI nondisplaced. Regular rate & rhythm. No rubs, gallops,3/6 SEM at apex Lungs: clear Abdomen: soft, nontender, nondistended. No hepatosplenomegaly. No bruits or masses. Good bowel sounds. Extremities: no cyanosis, clubbing, rash, edema Neuro: alert & orientedx3, cranial nerves grossly intact. moves all 4 extremities w/o difficulty. affect pleasant     ICD Specifications Following MD:  Andre Bunting, MD     Referring MD:  [facility] ICD Vendor:  [employer]     ICD Model Number:  [id number]     ICD Serial Number:  [id number] ICD DOI:  09/05/2006     ICD Implanting MD:  Andre Bunting, MD  Lead 1:    Location: RA     DOI: 08/14/2005     Model #: [id number]     Serial #: [id number]     Status: active Lead 2:    Location: RV     DOI: 09/05/2006     Model #: [id number]     Serial #: [id number]     Status: active Lead 3:    Location: LV     DOI: 09/05/2006     Model #: [id number]     Serial #: [id number]     Status: active Lead 4:    Location: RA     DOI: 08/14/2005     Model #: [id number]     Serial #: [id number]     Status: capped  Indications::  ICM, CHF   Episodes Coumadin:  Yes  Brady Parameters Mode DDDR     Lower Rate Limit:  60     Upper Rate Limit 139 PAV 130     Sensed AV Delay:  100  Tachy Zones VF:  200     VT:  250     VT1:  171     Impression & Recommendations:  Problem # 1:  SYSTOLIC HEART FAILURE, CHRONIC (ICD-428.22) Doing very well. Continue current regimen. Continue use of sliding-scale lasix. Unable to tolerate b-blocker due to profound fatigue.   Problem # 2:  ATRIAL FIBRILLATION (ICD-427.31) Long talk with patient and his family. He is tolerating rate-control strategy very well. Will continue. Back on coumadin without incident.  Problem # 3:  CAD, NATIVE VESSEL (ICD-414.01) Stable. No evidence of ischemia. Continue current regimen.  Patient Instructions: 1)   Follow up in 2 months

## 2011-01-01 NOTE — Medication Information (Signed)
Summary: rov/tm  Anticoagulant Therapy  Managed by: Bethena Midget, RN, BSN PCP: Newt Lukes MD Supervising MD: Ladona Ridgel MD, Sharlot Gowda Indication 1: Atrial Fibrillation (ICD-427.31) Lab Used: [facility] Site: [facility] INR POC 2.0 INR RANGE 2 - 3  Dietary changes: no    Health status changes: no    Bleeding/hemorrhagic complications: no    Recent/future hospitalizations: no    Any changes in medication regimen? yes       Details: Finished a Zpak last friday.   Recent/future dental: no  Any missed doses?: no       Is patient compliant with meds? yes       Allergies: 1)  ! Pcn  Anticoagulation Management History:      The patient is taking warfarin and comes in today for a routine follow up visit.  Positive risk factors for bleeding include an age of 15 years or older.  The bleeding index is 'intermediate risk'.  Positive CHADS2 values include History of CHF, History of HTN, and Age > 1 years old.  The start date was 01/30/2006.  His last INR was 2.9 ratio.  Anticoagulation responsible provider: Ladona Ridgel MD, Sharlot Gowda.  INR POC: 2.0.  Cuvette Lot#: [id number].  Exp: 10/2011.    Anticoagulation Management Assessment/Plan:      The patient's current anticoagulation dose is Coumadin 5 mg tabs: Take as directed by coumadin clinic..  The target INR is 2.0-3.0.  The next INR is due 10/17/2010.  Anticoagulation instructions were given to patient/wife.  Results were reviewed/authorized by Bethena Midget, RN, BSN.  He was notified by Ilean Skill D candidate.         Prior Anticoagulation Instructions: INR 2.2 Continue 5mg s daily except 2.5mg s on Mondays and Thursdays. Recheck in 4 weeks.   Current Anticoagulation Instructions: INR 2.0  Continue taking same dose of 1 tablet everyday except 1/2 tablet on Monday and Thursday. Recheck in 4 weeks.

## 2011-01-01 NOTE — Progress Notes (Signed)
Summary: doxazosin  Phone Note Refill Request Message from:  Fax from Pharmacy on July 30, 2010 3:39 PM  Refills Requested: Medication #1:  DOXAZOSIN MESYLATE 4 MG TABS 1 at bedtime.   Last Refilled: 06/30/2010  Method Requested: Electronic Initial call taken by: Orlan Leavens RMA,  July 30, 2010 3:39 PM    Prescriptions: DOXAZOSIN MESYLATE 4 MG TABS (DOXAZOSIN MESYLATE) 1 at bedtime  #30 x 0   Entered by:   Orlan Leavens RMA   Authorized by:   Newt Lukes MD   Signed by:   Orlan Leavens RMA on 07/30/2010   Method used:   Electronically to        [facility] (retail)       [address]       [place], [place]  [address]       Ph: [phone] or [phone]       Fax: [phone]   RxID:   [id number]

## 2011-01-01 NOTE — Medication Information (Signed)
Summary: rov/sp  Anticoagulant Therapy  Managed by: Weston Brass, PharmD PCP: Newt Lukes MD Supervising MD: Eden Emms MD, Theron Arista Indication 1: Atrial Fibrillation (ICD-427.31) Lab Used: [facility] Site: [facility] INR POC 2.1 INR RANGE 2 - 3  Dietary changes: no    Health status changes: no    Bleeding/hemorrhagic complications: no    Recent/future hospitalizations: no    Any changes in medication regimen? no    Recent/future dental: no  Any missed doses?: no       Is patient compliant with meds? yes       Allergies: 1)  ! Pcn  Anticoagulation Management History:      The patient is taking warfarin and comes in today for a routine follow up visit.  Positive risk factors for bleeding include an age of 75 years or older.  The bleeding index is 'intermediate risk'.  Positive CHADS2 values include History of CHF, History of HTN, and Age > 55 years old.  The start date was 01/30/2006.  His last INR was 2.9 ratio.  Anticoagulation responsible provider: Eden Emms MD, Theron Arista.  INR POC: 2.1.  Cuvette Lot#: [id number].  Exp: 09/2011.    Anticoagulation Management Assessment/Plan:      The patient's current anticoagulation dose is Coumadin 5 mg tabs: Take as directed by coumadin clinic..  The target INR is 2.0-3.0.  The next INR is due 08/28/2010.  Anticoagulation instructions were given to patient/wife.  Results were reviewed/authorized by Weston Brass, PharmD.  He was notified by Gweneth Fritter, PharmD Candidate.         Prior Anticoagulation Instructions: INR 2.0  Continue with 1 tablet daily except 1/2 tablet Mon and Thu. Return to clinic in 3 weeks.  Current Anticoagulation Instructions: INR 2.1  Continue taking 1 tablet (5mg ) every day except take 1/2 tablet (2.5mg ) on Mondays and Thursdays.  Recheck in 4 weeks.

## 2011-01-01 NOTE — Cardiovascular Report (Signed)
Summary: Office Visit   Office Visit   Imported By: Roderic Ovens 07/11/2010 10:54:30  _____________________________________________________________________  External Attachment:    Type:   Image     Comment:   External Document

## 2011-01-01 NOTE — Assessment & Plan Note (Signed)
Summary: 2pm  f75m   Visit Type:  Follow-up Primary Provider:  Newt Lukes MD  CC:  weak / fatigue.  History of Present Illness: [name] is a delightful 77 year old male with history congestive heart failure secondary to ischemic cardiomyopathy.  He has a totally occluded LAD on cardiac catheterization with nonobstructive disease elsewhere.  His EF is in the 25-30% range.  She is status post BiV ICD.  He has also had problems with atrial fibrillation and undergone several cardioversions.   He underwent repeat cardioversion in October 2010 and was maintained on amiodarone but he went back into AF so we stopped amio and decided to proceed with rate control which he tolerated   Returns for f/u. Recently had to hold lasix for a few days as SBP in 80s-90s. Restarted back at usual dose. BP has improved (Now SBP generall 120-130)  but continues to feel weak and fatigued. Just has no energy. Has to push himself to do anything. Watching weight very closely and uses sliding scale lasix effectively. No CP, orthopnea, PND or edema. Weight very stable. No ICD firings  ICD interrogated in clinic: Confirms atiral fib. Optivol remains flat. HR 60-70s. Activity index low 1-2 hrs/day  Current Medications (verified): 1)  Lasix 40 Mg Tabs (Furosemide) .Marland Kitchen.. 1 Once Daily 2)  Zyrtec Allergy 10 Mg Tabs (Cetirizine Hcl) .Marland Kitchen.. 1 Qd 3)  Aspirin 81 Mg  Tbec (Aspirin) .... One By Mouth Every Day 4)  Ocuvite  Tabs (Multiple Vitamins-Minerals) .Marland Kitchen.. 1 Once Daily 5)  Potassium Chloride 20 Meq  Pack (Potassium Chloride) .Marland Kitchen.. 1 Once Daily 6)  Lisinopril 10 Mg  Tabs (Lisinopril) .... Take 1/2  Tab By Mouth Daily 7)  Omeprazole 20 Mg Tbec (Omeprazole) .Marland Kitchen.. 1 Once Daily 8)  Flomax 0.4 Mg Cp24 (Tamsulosin Hcl) .... Take 2 Capsule By Mouth Daily 9)  Finasteride 5 Mg Tabs (Finasteride) .Marland Kitchen.. 1 Once Daily 10)  Melatonin 3 Mg Caps (Melatonin) .... At Bedtime 11)  Pravastatin Sodium 40 Mg Tabs (Pravastatin Sodium) .... Take  One Tablet By Mouth Daily At Bedtime 12)  Coumadin 5 Mg Tabs (Warfarin Sodium) .... Take As Directed By Coumadin Clinic. 13)  Vitamin D (Ergocalciferol) 50000 Unit Caps (Ergocalciferol) .... Take 1 Q Friday 14)  Clonazepam 0.5 Mg Tabs (Clonazepam) .... 1/2-1 Tab By Mouth  Two Times A Day As Needed 15)  Mirtazapine 15 Mg Tabs (Mirtazapine) .... 1/2 Tab At Bedtime As Needed 16)  Astelin 137 Mcg/spray Soln (Azelastine Hcl) .Marland Kitchen.. 1 Spray Each Nostril  Every Morning 17)  Fluticasone Propionate 50 Mcg/act Susp (Fluticasone Propionate) .Marland Kitchen.. 1 Spray Each Nostril  Every Morning 18)  Systane 0.4-0.3 % Soln (Polyethyl Glycol-Propyl Glycol) .... Left Eye At Bedtime 19)  Doxazosin Mesylate 4 Mg Tabs (Doxazosin Mesylate) .Marland Kitchen.. 1 At Bedtime - May Try Stopping This Med For 1-2 Nights and See What Happens With Your Dry Mouth and Bladder  Allergies (verified): 1)  ! Pcn  Past History:  Past Medical History: Last updated: 02/14/2010  1. CHF due to ischemic CM          a. Echo 8/08 EF: 20-30% moderate to severe MR, mild AI  2. Atrial fib -anticoag         a. Amiodarone stopped due to recurrent AF 1/11  3.  CAD         a. s/p cath 9/06 chronic total occlusion LAD with right-to-left and left-to-left collaterals,  minimal nonobstructive CAD, in LCX and RCA. EF was 45% at that time.  4. Moderate to severe, MR  5. History of anemia secondary to GI bleed.  6. History of symptomatic bradycardia         a. s/p BiV ICD  7. Hypertension.  8. Hyperlipidemia  9. Retinal bleeding after cataract removal 10. Diverticulitis. 11. Osteoporosis with severe kyphosis 12. BPH 13. Hiatal hernia. 14. Glaucoma. 15. Post-op urinary retention  MD rooster: cards - [email or web address]  Past Surgical History: Last updated: 08/30/2009 02/05/2008 -Cardioversion 09/05/2006-Removal of a previously implanted dual-chamber pacemaker generator, and insertion of a new biventricular ICD. 03/17/2006 -Direct current  cardioversion. Inguinal herniorrhaphy Surgery on his hand Eye surgery 2009 & 2010  Family History: Last updated: March 05, 2009 mother died at age 58 due to old age.   Father died at 65 due to AAA.  Social History: Last updated: 08/30/2009 Retired - Married lives with wife lives at friend home, indep living Tobacco Use - No.  Alcohol Use - no 3 children involved in care - bro in [place], 2 sis in town   Risk Factors: Smoking Status: never (Mar 05, 2009)  Review of Systems       As per HPI and past medical history; otherwise all systems negative.   Vital Signs:  Patient profile:   77 year old male Height:      68 inches Weight:      167 pounds Pulse rate:   65 / minute BP sitting:   104 / 58  (right arm) Cuff size:   regular  Vitals Entered By: Hardin Negus, RMA (March 28, 2010 2:15 PM)  Physical Exam  General:   Gen: frail kyphotic. no resp difficulty HEENT: normal Neck: supple. JVP flat Carotids 2+ bilat; no bruits. No lymphadenopathy or thryomegaly appreciated. Cor: PMI nondisplaced. Regular rate & rhythm. No rubs, gallops,3/6 SEM at apex Lungs: clear Abdomen: soft, nontender, nondistended. No hepatosplenomegaly. No bruits or masses. Good bowel sounds. Extremities: no cyanosis, clubbing, rash, edema Neuro: alert & orientedx3, cranial nerves grossly intact. moves all 4 extremities w/o difficulty. affect pleasant      ICD Specifications Following MD:  Lewayne Bunting, MD     Referring MD:  [facility] ICD Vendor:  [employer]     ICD Model Number:  [id number]     ICD Serial Number:  [id number] ICD DOI:  09/05/2006     ICD Implanting MD:  Lewayne Bunting, MD  Lead 1:    Location: RA     DOI: 08/14/2005     Model #: [id number]     Serial #: [id number]     Status: active Lead 2:    Location: RV     DOI: 09/05/2006     Model #: [id number]     Serial #: [id number]     Status: active Lead 3:    Location: LV     DOI: 09/05/2006     Model #: [id number]     Serial #: [id number]     Status:  active Lead 4:    Location: RA     DOI: 08/14/2005     Model #: [id number]     Serial #: [id number]     Status: capped  Indications::  ICM, CHF   Episodes Coumadin:  Yes  Brady Parameters Mode DDDR     Lower Rate Limit:  60     Upper Rate Limit 139 PAV 130     Sensed AV Delay:  100  Tachy Zones VF:  200  VT:  250     VT1:  171     Impression & Recommendations:  Problem # 1:  FATIGUE / MALAISE (ICD-780.79) Suspect this is likely multifactorial. Had tolerated AF well for severeal months so I doubt this is the major factor. Will attempt to turn pacing rate up to 70 bpm and see if that helps. Will also check CBC and thryoid.   Problem # 2:  SYSTOLIC HEART FAILURE, CHRONIC (ICD-428.22) Remains NYHA III to IIIb. Volume status looks good. Unable to tolerate medication titration due to hypotension.   Problem # 3:  ATRIAL FIBRILLATION (ICD-427.31) Rate controlled. Continue coumadin.   Other Orders: EKG w/ Interpretation (93000) Echocardiogram (Echo) TLB-BMP (Basic Metabolic Panel-BMET) (80048-METABOL) TLB-CBC Platelet - w/Differential (85025-CBCD) TLB-T4 (Thyrox), Free [phone]) TLB-TSH (Thyroid Stimulating Hormone) [id number])  Patient Instructions: 1)  Labs today 2)  Your physician has requested that you have an echocardiogram.  Echocardiography is a painless test that uses sound waves to create images of your heart. It provides your doctor with information about the size and shape of your heart and how well your heart's chambers and valves are working.  This procedure takes approximately one hour. There are no restrictions for this procedure. 3)  Follow up in 4-6 weeks

## 2011-01-01 NOTE — Cardiovascular Report (Signed)
Summary: Office Visit   Office Visit   Imported By: Roderic Ovens 03/01/2010 11:03:14  _____________________________________________________________________  External Attachment:    Type:   Image     Comment:   External Document

## 2011-01-01 NOTE — Progress Notes (Signed)
Summary: swelling in legs  Phone Note Call from Patient Call back at [phone]   Caller: Daughter/ Britta Mccreedy Summary of Call: Pt has swelling legs  Initial call taken by: Judie Grieve,  November 09, 2010 3:04 PM  Follow-up for Phone Call        discussed with Dr Gala Romney.  Pt to increase his Furosemide to 40mg  two times a day, and increase his Potassium to 20 meq two times a day.   Pt's wife awre and he has a follow up on Mon Dennis Bast, RN, BSN  November 09, 2010 4:49 PM

## 2011-01-01 NOTE — Medication Information (Signed)
Summary: rov-tp  Anticoagulant Therapy  Managed by: Leota Sauers, PharmD, BCPS, CPP PCP: Newt Lukes MD Supervising MD: Riley Kill MD, Maisie Fus Indication 1: Atrial Fibrillation (ICD-427.31) Lab Used: [facility] Site: [facility] INR POC 3.5 INR RANGE 2 - 3  Dietary changes: no    Health status changes: no    Bleeding/hemorrhagic complications: no    Recent/future hospitalizations: no    Any changes in medication regimen? no    Recent/future dental: no  Any missed doses?: no       Is patient compliant with meds? yes       Current Medications (verified): 1)  Amiodarone Hcl 200 Mg Tabs (Amiodarone Hcl) .... Take 1 Tablet By Mouth Once A Day 2)  Lasix 40 Mg Tabs (Furosemide) .Marland Kitchen.. 1 Once Daily 3)  Zyrtec Allergy 10 Mg Tabs (Cetirizine Hcl) .Marland Kitchen.. 1 Qd 4)  Aspirin 81 Mg  Tbec (Aspirin) .... One By Mouth Every Day 5)  Ocuvite  Tabs (Multiple Vitamins-Minerals) .Marland Kitchen.. 1 Once Daily 6)  Potassium Chloride 20 Meq  Pack (Potassium Chloride) .Marland Kitchen.. 1 Once Daily 7)  Lisinopril 10 Mg  Tabs (Lisinopril) .... Take 1/2  Tab By Mouth Daily 8)  Omeprazole 20 Mg Tbec (Omeprazole) .Marland Kitchen.. 1 Once Daily 9)  Flomax 0.4 Mg Cp24 (Tamsulosin Hcl) .... Take 2 Capsule By Mouth Daily 10)  Finasteride 5 Mg Tabs (Finasteride) .Marland Kitchen.. 1 Once Daily 11)  Melatonin 3 Mg Caps (Melatonin) .... At Bedtime 12)  Doxazosin Mesylate 4 Mg Tabs (Doxazosin Mesylate) .Marland Kitchen.. 1 At Bedtime 13)  Pravastatin Sodium 40 Mg Tabs (Pravastatin Sodium) .... Take One Tablet By Mouth Daily At Bedtime 14)  Coumadin 5 Mg Tabs (Warfarin Sodium) .... Take As Directed By Coumadin Clinic. 15)  Vitamin D (Ergocalciferol) 50000 Unit Caps (Ergocalciferol) .... Take 1 Q Friday 16)  Clonazepam 0.5 Mg Tabs (Clonazepam) .... 1/2-1 Tab By Mouth  Two Times A Day As Needed 17)  Mirtazapine 15 Mg Tabs (Mirtazapine) .... 1/2 Tab At Bedtime As Needed 18)  Astelin 137 Mcg/spray Soln (Azelastine Hcl) .Marland Kitchen.. 1 Spray Each Nostril  Every Morning 19)  Fluticasone  Propionate 50 Mcg/act Susp (Fluticasone Propionate) .Marland Kitchen.. 1 Spray Each Nostril  Every Morning  Allergies (verified): 1)  ! Pcn 2)  Penicillin V Potassium (Penicillin V Potassium)  Anticoagulation Management History:      The patient is taking warfarin and comes in today for a routine follow up visit.  Positive risk factors for bleeding include an age of 51 years or older.  The bleeding index is 'intermediate risk'.  Positive CHADS2 values include History of CHF, History of HTN, and Age > 16 years old.  The start date was 01/30/2006.  His last INR was 2.9 ratio.  Anticoagulation responsible provider: Riley Kill MD, Maisie Fus.  INR POC: 3.5.  Cuvette Lot#: [id number].  Exp: 03/2011.    Anticoagulation Management Assessment/Plan:      The patient's current anticoagulation dose is Coumadin 5 mg tabs: Take as directed by coumadin clinic..  The target INR is 2.0-3.0.  The next INR is due 01/11/2010.  Anticoagulation instructions were given to patient/wife.  Results were reviewed/authorized by Leota Sauers, PharmD, BCPS, CPP.         Prior Anticoagulation Instructions: The patient is to continue with the same dose of coumadin.  This dosage includes:   Current Anticoagulation Instructions: INR 3.5  No coumadin today Thur 1/20 Then continue coumadin 1/2 tab = 2.5mg  Tu, Thu, Sat and Sun 1 tab = 5mg  on Mon, Wed,  Fri

## 2011-01-01 NOTE — Assessment & Plan Note (Signed)
Summary: eri/mt   Visit Type:  Follow-up Primary Provider:  Newt Lukes MD   History of Present Illness: [name] is a delightful 77 year old male with history congestive heart failure secondary to ischemic cardiomyopathy.  He has a totally occluded LAD on cardiac catheterization with nonobstructive disease elsewhere.  His EF previously in the 25-30% range. Howevere echo earlier this month (04/2010) showed EF 45% with mod-sev MR. He is status post BiV ICD.  He has also had problems with atrial fibrillation and undergone several cardioversions but now with chronic atrial fib.  He has class 2 symptoms. His ICD has reached ERI.  Current Medications (verified): 1)  Lasix 40 Mg Tabs (Furosemide) .Marland Kitchen.. 1 Once Daily 2)  Zyrtec Allergy 10 Mg Tabs (Cetirizine Hcl) .Marland Kitchen.. 1 Qd 3)  Aspirin 81 Mg  Tbec (Aspirin) .... One By Mouth Every Day 4)  Ocuvite  Tabs (Multiple Vitamins-Minerals) .Marland Kitchen.. 1 Once Daily 5)  Potassium Chloride 20 Meq  Pack (Potassium Chloride) .Marland Kitchen.. 1 Once Daily 6)  Lisinopril 10 Mg  Tabs (Lisinopril) .... Take 1/2  Tab By Mouth Daily 7)  Omeprazole 20 Mg Tbec (Omeprazole) .Marland Kitchen.. 1 Once Daily 8)  Finasteride 5 Mg Tabs (Finasteride) .Marland Kitchen.. 1 Once Daily 9)  Melatonin 3 Mg Caps (Melatonin) .... At Bedtime 10)  Pravastatin Sodium 40 Mg Tabs (Pravastatin Sodium) .... Take One Tablet By Mouth Daily At Bedtime 11)  Coumadin 5 Mg Tabs (Warfarin Sodium) .... Take As Directed By Coumadin Clinic. 12)  Vitamin D (Ergocalciferol) 50000 Unit Caps (Ergocalciferol) .... Take 1 Q Friday 13)  Clonazepam 0.5 Mg Tabs (Clonazepam) .... 1/2-1 Tab By Mouth  Two Times A Day As Needed 14)  Mirtazapine 15 Mg Tabs (Mirtazapine) .... 1/2 Tab At Bedtime As Needed 15)  Astelin 137 Mcg/spray Soln (Azelastine Hcl) .Marland Kitchen.. 1 Spray Each Nostril  Every Morning 16)  Fluticasone Propionate 50 Mcg/act Susp (Fluticasone Propionate) .Marland Kitchen.. 1 Spray Each Nostril  Every Morning 17)  Systane 0.4-0.3 % Soln (Polyethyl Glycol-Propyl  Glycol) .... Left Eye At Bedtime 18)  Doxazosin Mesylate 4 Mg Tabs (Doxazosin Mesylate) .Marland Kitchen.. 1 At Bedtime 19)  Flomax 0.4 Mg Caps (Tamsulosin Hcl) .... Take 2 By Mouth Once Daily  Allergies: 1)  ! Pcn  Past History:  Past Medical History: Last updated: 09/17/2010  1. CHF due to ischemic CM          a. Echo 8/08 EF: 20-30% moderate to severe MR, mild AI  2. Atrial fib -anticoag         a. Amiodarone stopped due to recurrent AF 1/11  3.  CAD         a. s/p cath 9/06 chronic total occlusion LAD with right-to-left and left-to-left collaterals,               minimal nonobstructive CAD, in LCX and RCA. EF was 45% at that time.  4. Moderate to severe, MR  5. History of anemia secondary to GI bleed.  6. History of symptomatic bradycardia         a. s/p BiV ICD  7. Hypertension.  8. Hyperlipidemia  9. Retinal bleeding after cataract removal 10. Diverticulitis. 11. Osteoporosis with severe kyphosis 12. BPH 13. Hiatal hernia. 14. Glaucoma. 15. Post-op urinary retention  MD roster:   cards - benshimon  Past Surgical History: Last updated: 08/30/2009 02/05/2008 -Cardioversion 09/05/2006-Removal of a previously implanted dual-chamber pacemaker generator, and insertion of a new biventricular ICD. 03/17/2006 -Direct current cardioversion. Inguinal herniorrhaphy Surgery on his hand Eye  surgery 2009 & 2010  Review of Systems       The patient complains of dyspnea on exertion.  The patient denies chest pain, syncope, and peripheral edema.    Vital Signs:  Patient profile:   77 year old male Height:      68 inches Weight:      166 pounds BMI:     25.33 Pulse rate:   81 / minute BP sitting:   129 / 81  (left arm)  Vitals Entered By: [name] CMA (October 16, 2010 12:51 PM)  Physical Exam  General:  frail kyphotic - alert, well-developed, well-nourished, and cooperative to examination.   wife and dtr at side Head:  normocephalic and atraumatic Eyes:  wears glasses -  vision grossly intact; pupils equal, round and reactive to light.  conjunctiva and lids normal.    Neck:  supple, full ROM, no masses, no thyromegaly; no thyroid nodules or tenderness. no JVD or carotid bruits.   Chest Wall:  Well healed ICD incision. Lungs:  normal respiratory effort, no intercostal retractions or use of accessory muscles; diminshed breath sounds bilaterally at bases but no crackles and no wheezes.   few rhonchi at start Heart:  normal rate, regular rhythm, no murmur, and no rub. BLE with min edema, R 1+ and L trace (chronic R>L).  Abdomen:  soft, non-tender, normal bowel sounds, no distention; no masses and no appreciable hepatomegaly or splenomegaly.   Msk:  degenerative scoliosis with kyphosis - no gross extremity deformities Pulses:  pulses normal in all 4 extremities Extremities:  No clubbing or cyanosis.    ICD Specifications Following MD:  Andre Bunting, MD     Referring MD:   [facility] ICD Vendor:  [employer]     ICD Model Number:  [id number]     ICD Serial Number:  [id number] ICD DOI:  09/05/2006     ICD Implanting MD:  Andre Bunting, MD  Lead 1:    Location: RA     DOI: 08/14/2005     Model #: [id number]     Serial #: [id number]     Status: active Lead 2:    Location: RV     DOI: 09/05/2006     Model #: [id number]     Serial #: [id number]     Status: active Lead 3:    Location: LV     DOI: 09/05/2006     Model #: 4194     Serial #: [id number]     Status: active Lead 4:    Location: RA     DOI: 08/14/2005     Model #: [id number]     Serial #: [id number]     Status: capped  Indications::  ICM, CHF   ICD Follow Up ICD Dependent:  Yes      Episodes Coumadin:  Yes  Brady Parameters Mode DDDR     Lower Rate Limit:  75     Upper Rate Limit 130 PAV 130     Sensed AV Delay:  100  Tachy Zones VF:  200     VT:  250 FVT VIA VF     VT1:  200     MD Comments:  He has reached ERI.  Impression & Recommendations:  Problem # 1:  AUTOMATIC IMPLANTABLE CARDIAC DEFIBRILLATOR SITU  (ICD-V45.02) His device is working normally but has reached ERI. I have discussed the treatment options and have recommended a BiV PPM.  Problem # 2:  ATRIAL FIBRILLATION (ICD-427.31) His symptoms  are well controlled and he has rate controlled atrial fib and is mostly pacing. His updated medication list for this problem includes:    Aspirin 81 Mg Tbec (Aspirin) ..... One by mouth every day    Coumadin 5 Mg Tabs (Warfarin sodium) .Marland Kitchen... Take as directed by coumadin clinic.  Problem # 3:  SYSTOLIC HEART FAILURE, CHRONIC (ICD-428.22) His symptoms are well compensated.  Will continue a low sodium diet and meds as  below. His updated medication list for this problem includes:    Lasix 40 Mg Tabs (Furosemide) .Marland Kitchen... 1 once daily    Aspirin 81 Mg Tbec (Aspirin) ..... One by mouth every day    Lisinopril 10 Mg Tabs (Lisinopril) .Marland Kitchen... Take 1/2  tab by mouth daily    Coumadin 5 Mg Tabs (Warfarin sodium) .Marland Kitchen... Take as directed by coumadin clinic.

## 2011-01-01 NOTE — Progress Notes (Signed)
Summary: calling about stopping Coumadin due to procedure  Phone Note Call from Patient Call back at Home Phone [phone]   Caller: Patient Summary of Call: Pt having a procedure on 11/30/10 and pt need to know if Coumadin need to be stop Initial call taken by: Judie Grieve,  October 29, 2010 4:42 PM  Follow-up for Phone Call        do not stop Coumadin Dennis Bast, RN, BSN  October 29, 2010 5:15 PM

## 2011-01-01 NOTE — Medication Information (Signed)
Summary: rov/ewj  Anticoagulant Therapy  Managed by: Charolotte Eke, PharmD PCP: Newt Lukes MD Supervising MD: Gala Romney MD, Reuel Boom Indication 1: Atrial Fibrillation (ICD-427.31) Lab Used: [facility]  Site: [facility] INR POC 1.5 INR RANGE 2 - 3  Dietary changes: no    Health status changes: no    Bleeding/hemorrhagic complications: no    Recent/future hospitalizations: no    Any changes in medication regimen? no    Recent/future dental: no  Any missed doses?: no       Is patient compliant with meds? yes       Allergies: 1)  ! Pcn  Anticoagulation Management History:      The patient is taking warfarin and comes in today for a routine follow up visit.  Positive risk factors for bleeding include an age of 75 years or older.  The bleeding index is 'intermediate risk'.  Positive CHADS2 values include History of CHF, History of HTN, and Age > 53 years old.  The start date was 01/30/2006.  His last INR was 2.9 ratio.  Anticoagulation responsible provider: Gwenevere Goga MD, Reuel Boom.  INR POC: 1.5.  Cuvette Lot#: [id number].  Exp: 09/02/2011.    Anticoagulation Management Assessment/Plan:      The patient's current anticoagulation dose is Coumadin 5 mg tabs: Take as directed by coumadin clinic..  The target INR is 2.0-3.0.  The next INR is due 07/12/2010.  Anticoagulation instructions were given to patient/wife.  Results were reviewed/authorized by Charolotte Eke, PharmD.  He was notified by Charolotte Eke, PharmD.         Prior Anticoagulation Instructions: INR 1.8  Take 1 tablet today, then resume same dosage 1 tablet daily except 1/2 tablet on Sundays Tuesdays and Thursdays. Recheck in 2-3 weeks.   Current Anticoagulation Instructions: Take 1 tablet today (06/28/10) then take: 1 tablet daily except half tablet on Mon and Wed.

## 2011-01-01 NOTE — Procedures (Signed)
Summary: bruising at site   Current Medications (verified): 1)  Lasix 40 Mg Tabs (Furosemide) .Marland Kitchen.. 1 Once Daily 2)  Zyrtec Allergy 10 Mg Tabs (Cetirizine Hcl) .Marland Kitchen.. 1 Qd 3)  Aspirin 81 Mg  Tbec (Aspirin) .... One By Mouth Every Day 4)  Ocuvite  Tabs (Multiple Vitamins-Minerals) .Marland Kitchen.. 1 Once Daily 5)  Potassium Chloride 20 Meq  Pack (Potassium Chloride) .Marland Kitchen.. 1 Once Daily 6)  Lisinopril 10 Mg  Tabs (Lisinopril) .... Take 1/2  Tab By Mouth Daily 7)  Omeprazole 20 Mg Tbec (Omeprazole) .Marland Kitchen.. 1 Once Daily 8)  Finasteride 5 Mg Tabs (Finasteride) .Marland Kitchen.. 1 Once Daily 9)  Melatonin 3 Mg Caps (Melatonin) .... At Bedtime 10)  Pravastatin Sodium 40 Mg Tabs (Pravastatin Sodium) .... Take One Tablet By Mouth Daily At Bedtime 11)  Coumadin 5 Mg Tabs (Warfarin Sodium) .... Take As Directed By Coumadin Clinic. 12)  Vitamin D (Ergocalciferol) 50000 Unit Caps (Ergocalciferol) .... Take 1 Q Friday 13)  Clonazepam 0.5 Mg Tabs (Clonazepam) .... 1/2-1 Tab By Mouth  Two Times A Day As Needed 14)  Mirtazapine 15 Mg Tabs (Mirtazapine) .... 1/2 Tab At Bedtime As Needed 15)  Astelin 137 Mcg/spray Soln (Azelastine Hcl) .Marland Kitchen.. 1 Spray Each Nostril  Every Morning 16)  Fluticasone Propionate 50 Mcg/act Susp (Fluticasone Propionate) .Marland Kitchen.. 1 Spray Each Nostril  Every Morning 17)  Systane 0.4-0.3 % Soln (Polyethyl Glycol-Propyl Glycol) .... Left Eye At Bedtime 18)  Doxazosin Mesylate 4 Mg Tabs (Doxazosin Mesylate) .Marland Kitchen.. 1 At Bedtime 19)  Flomax 0.4 Mg Caps (Tamsulosin Hcl) .... Take 2 By Mouth Once Daily 20)  Zoloft 25 Mg Tabs (Sertraline Hcl) .... Take Half Tablet Daily For 4 Days. Then Take 1 Tablet For 14 Days. Then Take 2 Tablets By Mouth Daily  Allergies (verified): 1)  ! Pcn   PPM Specifications Following MD:  Lewayne Bunting, MD     PPM Vendor:  [employer]     PPM Model Number:  [id number]     PPM Serial Number:  [id number] PPM DOI:  10/31/2010     PPM Implanting MD:  Lewayne Bunting, MD  Lead 1    Location: RA     DOI: 08/14/2005      Model #: [id number]     Serial #: [id number]     Status: active Lead 2    Location: RV     DOI: 08/14/2005     Model #: [id number]     Serial #: [id number]     Status: active Lead 3    Location: LV     DOI: 09/05/2006     Model #: 4194     Serial #: [id number]     Status: active  Magnet Response Rate:  BOL 85 ERI 65  Indications:  CM   PPM Follow Up Remote Check?  No Battery Voltage:  3.07 V     Pacer Dependent:  Yes       PPM Device Measurements Atrium  Amplitude: 0.3 mV, Impedance: 399 ohms,  Right Ventricle  Amplitude: 8.1 mV, Impedance: 418 ohms, Threshold: 0.75 V at 0.4 msec Left Ventricle  Impedance: 380 ohms, Threshold: 2.75 V at 0.8 msec  Episodes Ventricular Pacing:  93%  Parameters Mode:  vvi     Lower Rate Limit:  75     Upper Rate Limit:  120 Tech Comments:  Mr. Degnan was seen today at his request for concern of his site.  There is a large hematoma noted.  I have asked Dr.  Juanda Chance to evaluate this today given that Mr. Rowser was changed out 10/31/10 for the 3 time and remains on coumadin. Altha Harm, LPN  November 05, 2010 3:34 PM   ICD Specifications Following MD:  Lewayne Bunting, MD     Referring MD:  [facility] ICD Vendor:  [employer]     ICD Model Number:  [id number]     ICD Serial Number:  [id number] ICD DOI:  09/05/2006     ICD Implanting MD:  Lewayne Bunting, MD  Lead 1:    Location: RA     DOI: 08/14/2005     Model #: 5076     Serial #: [id number]     Status: active Lead 2:    Location: RV     DOI: 09/05/2006     Model #: 4627     Serial #: [id number]     Status: active Lead 3:    Location: LV     DOI: 09/05/2006     Model #: 4194     Serial #: [id number]     Status: active Lead 4:    Location: RA     DOI: 08/14/2005     Model #: 6789     Serial #: [id number]     Status: capped  Indications::  ICM, CHF  Explantation Comments: 10/31/2010 Medtronic [id number]  ICD Follow Up ICD Dependent:  Yes      Episodes Coumadin:  Yes  Brady Parameters Mode DDDR     Lower Rate  Limit:  75     Upper Rate Limit 130 PAV 130     Sensed AV Delay:  100  Tachy Zones VF:  200     VT:  250 FVT VIA VF     VT1:  200       Appended Document: bruising at site Please ask the patient to stop coumadin and followup with me in several days.

## 2011-01-01 NOTE — Assessment & Plan Note (Signed)
Summary: 3 MOS F/U //CD   Vital Signs:  Patient profile:   77 year old male Height:      68 inches (172.72 cm) Weight:      167.8 pounds (76.27 kg) O2 Sat:      97 % on Room air Temp:     97.0 degrees F (36.11 degrees C) oral Pulse rate:   68 / minute BP sitting:   122 / 80  (left arm) Cuff size:   regular  Vitals Entered By: Orlan Leavens (February 14, 2010 2:55 PM)  O2 Flow:  Room air CC: 3 month follow-up Is Patient Diabetic? No Pain Assessment Patient in pain? no        Primary Care Provider:  Newt Lukes MD  CC:  3 month follow-up.  History of Present Illness: here for 3 mo f/u  1) dyslipidemia - reports compliance with ongoing medical treatment and no changes in medication dose or frequency. denies adverse side effects related to current therapy. no G or muscle c/o  2) HTN - reports compliance with ongoing medical treatment and no changes in medication dose or frequency. denies adverse side effects related to current therapy. no HA or vision changes, no weakness  3) chronic CHF - reports compliance with ongoing medical treatment and no changes in medication dose or frequency. denies adverse side effects related to current therapy. weight stable - no swelling of legs or SOB  4) PAF -  changes in amio reviewed (now off amio, rate control only) -  no bleeding with anticoag or bruising -   Clinical Review Panels:  Lipid Management   Cholesterol:  181 (11/08/2009)   LDL (bad choesterol):  89 (11/08/2009)   HDL (good cholesterol):  78.60 (11/08/2009)   Triglycerides:  109 (06/01/2008)  CBC   WBC:  5.8 (11/08/2009)   RBC:  3.70 (11/08/2009)   Hgb:  12.9 (11/08/2009)   Hct:  37.9 (11/08/2009)   Platelets:  162.0 (11/08/2009)   MCV  102.4 (11/08/2009)   MCHC  34.2 (11/08/2009)   RDW  12.7 (11/08/2009)   PMN:  62.2 (11/08/2009)   Lymphs:  25.3 (11/08/2009)   Monos:  10.6 (11/08/2009)   Eosinophils:  1.3 (11/08/2009)   Basophil:  0.6  (11/08/2009)  Complete Metabolic Panel   Glucose:  116 (11/08/2009)   Sodium:  142 (11/08/2009)   Potassium:  4.6 (11/08/2009)   Chloride:  103 (11/08/2009)   CO2:  31 (11/08/2009)   BUN:  22 (11/08/2009)   Creatinine:  1.3 (11/08/2009)   Albumin:  4.4 (11/08/2009)   Total Protein:  7.2 (11/08/2009)   Calcium:  9.6 (11/08/2009)   Total Bili:  1.0 (11/08/2009)   Alk Phos:  43 (11/08/2009)   SGPT (ALT):  20 (11/08/2009)   SGOT (AST):  28 (11/08/2009)   Current Medications (verified): 1)  Lasix 40 Mg Tabs (Furosemide) .Marland Kitchen.. 1 Once Daily 2)  Zyrtec Allergy 10 Mg Tabs (Cetirizine Hcl) .Marland Kitchen.. 1 Qd 3)  Aspirin 81 Mg  Tbec (Aspirin) .... One By Mouth Every Day 4)  Ocuvite  Tabs (Multiple Vitamins-Minerals) .Marland Kitchen.. 1 Once Daily 5)  Potassium Chloride 20 Meq  Pack (Potassium Chloride) .Marland Kitchen.. 1 Once Daily 6)  Lisinopril 10 Mg  Tabs (Lisinopril) .... Take 1/2  Tab By Mouth Daily 7)  Omeprazole 20 Mg Tbec (Omeprazole) .Marland Kitchen.. 1 Once Daily 8)  Flomax 0.4 Mg Cp24 (Tamsulosin Hcl) .... Take 2 Capsule By Mouth Daily 9)  Finasteride 5 Mg Tabs (Finasteride) .Marland Kitchen.. 1 Once Daily  10)  Melatonin 3 Mg Caps (Melatonin) .... At Bedtime 11)  Doxazosin Mesylate 4 Mg Tabs (Doxazosin Mesylate) .Marland Kitchen.. 1 At Bedtime 12)  Pravastatin Sodium 40 Mg Tabs (Pravastatin Sodium) .... Take One Tablet By Mouth Daily At Bedtime 13)  Coumadin 5 Mg Tabs (Warfarin Sodium) .... Take As Directed By Coumadin Clinic. 14)  Vitamin D (Ergocalciferol) 50000 Unit Caps (Ergocalciferol) .... Take 1 Q Friday 15)  Clonazepam 0.5 Mg Tabs (Clonazepam) .... 1/2-1 Tab By Mouth  Two Times A Day As Needed 16)  Mirtazapine 15 Mg Tabs (Mirtazapine) .... 1/2 Tab At Bedtime As Needed 17)  Astelin 137 Mcg/spray Soln (Azelastine Hcl) .Marland Kitchen.. 1 Spray Each Nostril  Every Morning 18)  Fluticasone Propionate 50 Mcg/act Susp (Fluticasone Propionate) .Marland Kitchen.. 1 Spray Each Nostril  Every Morning 19)  Systane 0.4-0.3 % Soln (Polyethyl Glycol-Propyl Glycol) .... Left Eye At  Bedtime  Allergies (verified): 1)  ! Pcn  Past History:  Past Medical History:  1. CHF due to ischemic CM          a. Echo 8/08 EF: 20-30% moderate to severe MR, mild AI  2. Atrial fib -anticoag         a. Amiodarone stopped due to recurrent AF 1/11  3.  CAD         a. s/p cath 9/06 chronic total occlusion LAD with right-to-left and left-to-left collaterals,               minimal nonobstructive CAD, in LCX and RCA. EF was 45% at that time.  4. Moderate to severe, MR  5. History of anemia secondary to GI bleed.  6. History of symptomatic bradycardia         a. s/p BiV ICD  7. Hypertension.  8. Hyperlipidemia  9. Retinal bleeding after cataract removal 10. Diverticulitis. 11. Osteoporosis with severe kyphosis 12. BPH 13. Hiatal hernia. 14. Glaucoma. 15. Post-op urinary retention  MD rooster: cards - benshimon  Review of Systems  The patient denies weight loss and headaches.         c/o ongoing fatigue, no change  Physical Exam  General:  frail kyphotic - alert, well-developed, well-nourished, and cooperative to examination.   wife  at side Lungs:  normal respiratory effort, no intercostal retractions or use of accessory muscles; normal breath sounds bilaterally - no crackles and no wheezes.    Heart:  normal rate, regular rhythm, no murmur, and no rub. BLE with min edema, R 1+ and L trace (chronic R>L).    Impression & Recommendations:  Problem # 1:  BENIGN PROSTATIC HYPERTROPHY, WITH OBSTRUCTION (ICD-600.01) c/o dry mouth - try stopping med for night or two and watch symptoms  His updated medication list for this problem includes:    Flomax 0.4 Mg Cp24 (Tamsulosin hcl) .Marland Kitchen... Take 2 capsule by mouth daily    Finasteride 5 Mg Tabs (Finasteride) .Marland Kitchen... 1 once daily    Doxazosin Mesylate 4 Mg Tabs (Doxazosin mesylate) .Marland Kitchen... 1 at bedtime - may try stopping this med for 1-2 nights and see what happens with your dry mouth and bladder  Problem # 2:  HYPERLIPIDEMIA-MIXED  (ICD-272.4)  His updated medication list for this problem includes:    Pravastatin Sodium 40 Mg Tabs (Pravastatin sodium) .Marland Kitchen... Take one tablet by mouth daily at bedtime  Labs Reviewed: SGOT: 28 (11/08/2009)   SGPT: 20 (11/08/2009)   HDL:78.60 (11/08/2009), 47 (06/01/2008)  LDL:89 (11/08/2009), 165 (44/12/270)  Chol:181 (11/08/2009), 234 (06/01/2008)  Trig:65.0 (11/08/2009), 109 (06/01/2008)  Problem # 3:  ATRIAL FIBRILLATION (ICD-427.31)  His updated medication list for this problem includes:    Aspirin 81 Mg Tbec (Aspirin) ..... One by mouth every day    Coumadin 5 Mg Tabs (Warfarin sodium) .Marland Kitchen... Take as directed by coumadin clinic.  He is tolerating rate-control strategy very well, off amio. Will continue same. Per cards  Problem # 4:  HYPERTENSION, BENIGN (ICD-401.1)  His updated medication list for this problem includes:    Lasix 40 Mg Tabs (Furosemide) .Marland Kitchen... 1 once daily    Lisinopril 10 Mg Tabs (Lisinopril) .Marland Kitchen... Take 1/2  tab by mouth daily    Doxazosin Mesylate 4 Mg Tabs (Doxazosin mesylate) .Marland Kitchen... 1 at bedtime - may try stopping this med for 1-2 nights and see what happens with your dry mouth and bladder  BP today: 122/80 Prior BP: 108/62 (01/11/2010)  Labs Reviewed: K+: 4.6 (11/08/2009) Creat: : 1.3 (11/08/2009)   Chol: 181 (11/08/2009)   HDL: 78.60 (11/08/2009)   LDL: 89 (11/08/2009)   TG: 65.0 (11/08/2009)  Complete Medication List: 1)  Lasix 40 Mg Tabs (Furosemide) .Marland Kitchen.. 1 once daily 2)  Zyrtec Allergy 10 Mg Tabs (Cetirizine hcl) .Marland Kitchen.. 1 qd 3)  Aspirin 81 Mg Tbec (Aspirin) .... One by mouth every day 4)  Ocuvite Tabs (Multiple vitamins-minerals) .Marland Kitchen.. 1 once daily 5)  Potassium Chloride 20 Meq Pack (Potassium chloride) .Marland Kitchen.. 1 once daily 6)  Lisinopril 10 Mg Tabs (Lisinopril) .... Take 1/2  tab by mouth daily 7)  Omeprazole 20 Mg Tbec (Omeprazole) .Marland Kitchen.. 1 once daily 8)  Flomax 0.4 Mg Cp24 (Tamsulosin hcl) .... Take 2 capsule by mouth daily 9)  Finasteride 5 Mg Tabs  (Finasteride) .Marland Kitchen.. 1 once daily 10)  Melatonin 3 Mg Caps (Melatonin) .... At bedtime 11)  Pravastatin Sodium 40 Mg Tabs (Pravastatin sodium) .... Take one tablet by mouth daily at bedtime 12)  Coumadin 5 Mg Tabs (Warfarin sodium) .... Take as directed by coumadin clinic. 13)  Vitamin D (ergocalciferol) 50000 Unit Caps (Ergocalciferol) .... Take 1 q friday 14)  Clonazepam 0.5 Mg Tabs (Clonazepam) .... 1/2-1 tab by mouth  two times a day as needed 15)  Mirtazapine 15 Mg Tabs (Mirtazapine) .... 1/2 tab at bedtime as needed 16)  Astelin 137 Mcg/spray Soln (Azelastine hcl) .Marland Kitchen.. 1 spray each nostril  every morning 17)  Fluticasone Propionate 50 Mcg/act Susp (Fluticasone propionate) .Marland Kitchen.. 1 spray each nostril  every morning 18)  Systane 0.4-0.3 % Soln (Polyethyl glycol-propyl glycol) .... Left eye at bedtime 19)  Doxazosin Mesylate 4 Mg Tabs (Doxazosin mesylate) .Marland Kitchen.. 1 at bedtime - may try stopping this med for 1-2 nights and see what happens with your dry mouth and bladder  Patient Instructions: 1)  it was good to see you today. 2)  try stopping doxazosin for 1 or 2 nights as discussed -  3)  no labs needed or medicine changes - 4)  Please schedule a follow-up appointment in 6 months, sooner if problems.

## 2011-01-01 NOTE — Letter (Signed)
Summary: Implantable Device Instructions  Architectural technologist, Main Office  [address]   [place], [place] [address]   Phone: [phone]  Fax: [phone]      Implantable Device Instructions  You are scheduled for:  Generator Change  on 10/31/10 with Dr. Ladona Ridgel.  1.  Please arrive at the Short Stay Center at [facility] at 6:45am on the day of your procedure.  2.  Do not eat or drink after midnight  the night before your procedure.  3.  Complete lab work on 10/23/10.  .  You do not have to be fasting.  4.  Do NOT take these medications for the morning of your procedure:  Furosemide  Take your last dose of Coumadin on --will let you know if you need to hold.  5.  Plan for an overnight stay.  Bring your insurance cards and a list of your medications.  6.  Wash your chest and neck with antibacterial soap (any brand) the evening before and the morning of your procedure.  Rinse well.    *If you have ANY questions after you get home, please call the office [phone].  Anselm Pancoast  *Every attempt is made to prevent procedures from being rescheduled.  Due to the nauture of Electrophysiology, rescheduling can happen.  The physician is always aware and directs the staff when this occurs.

## 2011-01-01 NOTE — Medication Information (Signed)
Summary: rov/tm  Anticoagulant Therapy  Managed by: Cloyde Reams, RN, BSN PCP: Newt Lukes MD Supervising MD: Ladona Ridgel MD, Sharlot Gowda Indication 1: Atrial Fibrillation (ICD-427.31) Lab Used: [facility] Site: [facility] INR POC 1.8 INR RANGE 2 - 3  Dietary changes: no    Health status changes: no    Bleeding/hemorrhagic complications: no    Recent/future hospitalizations: no    Any changes in medication regimen? no    Recent/future dental: no  Any missed doses?: no       Is patient compliant with meds? yes       Allergies: 1)  ! Pcn  Anticoagulation Management History:      The patient is taking warfarin and comes in today for a routine follow up visit.  Positive risk factors for bleeding include an age of 75 years or older.  The bleeding index is 'intermediate risk'.  Positive CHADS2 values include History of CHF, History of HTN, and Age > 52 years old.  The start date was 01/30/2006.  His last INR was 2.9 ratio.  Anticoagulation responsible provider: Ladona Ridgel MD, Sharlot Gowda.  INR POC: 1.8.  Cuvette Lot#: [id number].  Exp: 08/2011.    Anticoagulation Management Assessment/Plan:      The patient's current anticoagulation dose is Coumadin 5 mg tabs: Take as directed by coumadin clinic..  The target INR is 2.0-3.0.  The next INR is due 06/28/2010.  Anticoagulation instructions were given to patient/wife.  Results were reviewed/authorized by Cloyde Reams, RN, BSN.  He was notified by Cloyde Reams RN.         Prior Anticoagulation Instructions: INR 2.1 Continue 5mg s everyday except 2.5mg s on Tuesdays, Thursdays and Sundays. Recheck in 4 weeks, ok'd by pharm d.   Current Anticoagulation Instructions: INR 1.8  Take 1 tablet today, then resume same dosage 1 tablet daily except 1/2 tablet on Sundays Tuesdays and Thursdays. Recheck in 2-3 weeks.

## 2011-01-01 NOTE — Assessment & Plan Note (Signed)
Summary: pc2 medtronic/sl   Visit Type:  Follow-up Primary Provider:  Newt Lukes MD   History of Present Illness: [name] is a delightful 77 year old male with history congestive heart failure secondary to ischemic cardiomyopathy.  He has a totally occluded LAD on cardiac catheterization with nonobstructive disease elsewhere.  His EF previously in the 25-30% range. Howevere echo earlier this month (04/2010) showed EF 45% with mod-sev MR. He is status post BiV ICD.  He has also had problems with atrial fibrillation and undergone several cardioversions but now with chronic atrial fib.   He notes that he has felt weak and "lazy".  He has not had any intercurrent ICD shocks.  No edema. No syncope.    Current Medications (verified): 1)  Lasix 40 Mg Tabs (Furosemide) .Marland Kitchen.. 1 Once Daily 2)  Zyrtec Allergy 10 Mg Tabs (Cetirizine Hcl) .Marland Kitchen.. 1 Qd 3)  Aspirin 81 Mg  Tbec (Aspirin) .... One By Mouth Every Day 4)  Ocuvite  Tabs (Multiple Vitamins-Minerals) .Marland Kitchen.. 1 Once Daily 5)  Potassium Chloride 20 Meq  Pack (Potassium Chloride) .Marland Kitchen.. 1 Once Daily 6)  Lisinopril 10 Mg  Tabs (Lisinopril) .... Take 1/2  Tab By Mouth Daily 7)  Omeprazole 20 Mg Tbec (Omeprazole) .Marland Kitchen.. 1 Once Daily 8)  Flomax 0.4 Mg Cp24 (Tamsulosin Hcl) .... Take 2 Capsule By Mouth Daily 9)  Finasteride 5 Mg Tabs (Finasteride) .Marland Kitchen.. 1 Once Daily 10)  Melatonin 3 Mg Caps (Melatonin) .... At Bedtime 11)  Pravastatin Sodium 40 Mg Tabs (Pravastatin Sodium) .... Take One Tablet By Mouth Daily At Bedtime 12)  Coumadin 5 Mg Tabs (Warfarin Sodium) .... Take As Directed By Coumadin Clinic. 13)  Vitamin D (Ergocalciferol) 50000 Unit Caps (Ergocalciferol) .... Take 1 Q Friday 14)  Clonazepam 0.5 Mg Tabs (Clonazepam) .... 1/2-1 Tab By Mouth  Two Times A Day As Needed 15)  Mirtazapine 15 Mg Tabs (Mirtazapine) .... 1/2 Tab At Bedtime As Needed 16)  Astelin 137 Mcg/spray Soln (Azelastine Hcl) .Marland Kitchen.. 1 Spray Each Nostril  Every Morning 17)   Fluticasone Propionate 50 Mcg/act Susp (Fluticasone Propionate) .Marland Kitchen.. 1 Spray Each Nostril  Every Morning 18)  Systane 0.4-0.3 % Soln (Polyethyl Glycol-Propyl Glycol) .... Left Eye At Bedtime 19)  Doxazosin Mesylate 4 Mg Tabs (Doxazosin Mesylate) .Marland Kitchen.. 1 At Bedtime  Allergies: 1)  ! Pcn  Past History:  Past Medical History: Last updated: 02/14/2010  1. CHF due to ischemic CM          a. Echo 8/08 EF: 20-30% moderate to severe MR, mild AI  2. Atrial fib -anticoag         a. Amiodarone stopped due to recurrent AF 1/11  3.  CAD         a. s/p cath 9/06 chronic total occlusion LAD with right-to-left and left-to-left collaterals,               minimal nonobstructive CAD, in LCX and RCA. EF was 45% at that time.  4. Moderate to severe, MR  5. History of anemia secondary to GI bleed.  6. History of symptomatic bradycardia         a. s/p BiV ICD  7. Hypertension.  8. Hyperlipidemia  9. Retinal bleeding after cataract removal 10. Diverticulitis. 11. Osteoporosis with severe kyphosis 12. BPH 13. Hiatal hernia. 14. Glaucoma. 15. Post-op urinary retention  MD rooster: cards - [email or web address]  Past Surgical History: Last updated: 08/30/2009 02/05/2008 -Cardioversion 09/05/2006-Removal of a previously implanted dual-chamber pacemaker generator, and insertion of  a new biventricular ICD. 03/17/2006 -Direct current cardioversion. Inguinal herniorrhaphy Surgery on his hand Eye surgery 2009 & 2010  Review of Systems  The patient denies chest pain, syncope, dyspnea on exertion, peripheral edema, and prolonged cough.    Vital Signs:  Patient profile:   77 year old male Height:      68 inches Weight:      165 pounds BMI:     25.18 Pulse rate:   80 / minute BP sitting:   108 / 72  (left arm)  Vitals Entered By: Laurance Flatten CMA (July 10, 2010 1:48 PM)  Physical Exam  General:  frail kyphotic. no resp difficulty HEENT: normal Neck: supple. JVP 6 Carotids 2+ bilat; no bruits. No  lymphadenopathy or thryomegaly appreciated. Cor: PMI nondisplaced. Regular rate & rhythm. No rubs, gallops,3/6 SEM at apex Lungs: clear Abdomen: soft, nontender, nondistended. No hepatosplenomegaly. No bruits or masses. Good bowel sounds. Extremities: no cyanosis, clubbing, rash, edema Neuro: alert & orientedx3, cranial nerves grossly intact. moves all 4 extremities w/o difficulty. affect pleasant. no drift.       ICD Specifications Following MD:  Lewayne Bunting, MD     Referring MD:  [facility] ICD Vendor:  Medtronic     ICD Model Number:  [phone]     ICD Serial Number:  [id number] ICD DOI:  09/05/2006     ICD Implanting MD:  Lewayne Bunting, MD  Lead 1:    Location: RA     DOI: 08/14/2005     Model #: 1478     Serial #: [id number]     Status: active Lead 2:    Location: RV     DOI: 09/05/2006     Model #: 5784     Serial #: [id number]     Status: active Lead 3:    Location: LV     DOI: 09/05/2006     Model #: 4194     Serial #: [id number]     Status: active Lead 4:    Location: RA     DOI: 08/14/2005     Model #: [id number]     Serial #: [id number]     Status: capped  Indications::  ICM, CHF   ICD Follow Up Battery Voltage:  2.68 V     Charge Time:  10.9 seconds     Underlying rhythm:  AF ICD Dependent:  Yes       ICD Device Measurements Atrium:  Amplitude: 1.3 mV, Impedance: 456 ohms,  Right Ventricle:  Amplitude: 3.8 mV, Impedance: 488 ohms, Threshold: 1.0 V at 0.4 msec Left Ventricle:  Impedance: 356 ohms, Threshold: 2.0 V at 1.2 msec Shock Impedance: 43/57 ohms   Episodes MS Episodes:  3     Percent Mode Switch:  100%     Coumadin:  Yes Shock:  0     ATP:  0     Nonsustained:  0     Atrial Therapies:  0 Atrial Pacing:  0.3%     Ventricular Pacing:  97.1%  Brady Parameters Mode DDDR     Lower Rate Limit:  75     Upper Rate Limit 130 PAV 130     Sensed AV Delay:  100  Tachy Zones VF:  200     VT:  250 FVT VIA VF     VT1:  200     Next Cardiology Appt Due:  10/02/2010 Tech  Comments:  PT IN AF 100% OF TIME SINCE LAST CHECK  ON 06-07-10. + COUMADIN.  NORMAL DEVICE FUNCTION.  TURNED 1:1 SVT ON.  ROV IN 3 MTHS W/CLINIC. Vella Kohler  July 10, 2010 1:59 PM MD Comments:  Agree with above.  Impression & Recommendations:  Problem # 1:  AUTOMATIC IMPLANTABLE CARDIAC DEFIBRILLATOR SITU (ICD-V45.02) His device is working normally.  Will recheck in several months.  Problem # 2:  ATRIAL FIBRILLATION (ICD-427.31) His ventricular rate is well controlled.  Continue meds as below. His updated medication list for this problem includes:    Aspirin 81 Mg Tbec (Aspirin) ..... One by mouth every day    Coumadin 5 Mg Tabs (Warfarin sodium) .Marland Kitchen... Take as directed by coumadin clinic.  Problem # 3:  SYSTOLIC HEART FAILURE, CHRONIC (ICD-428.22) He remains class 2-3.  A low sodium diet is recommended. His updated medication list for this problem includes:    Lasix 40 Mg Tabs (Furosemide) .Marland Kitchen... 1 once daily    Aspirin 81 Mg Tbec (Aspirin) ..... One by mouth every day    Lisinopril 10 Mg Tabs (Lisinopril) .Marland Kitchen... Take 1/2  tab by mouth daily    Coumadin 5 Mg Tabs (Warfarin sodium) .Marland Kitchen... Take as directed by coumadin clinic.  Patient Instructions: 1)  Your physician recommends that you schedule a follow-up appointment in: 3 months with device clinic and 12 months with Dr Ladona Ridgel

## 2011-01-01 NOTE — Assessment & Plan Note (Signed)
Summary: 2pm  f17m   Visit Type:  Follow-up Primary Provider:  Newt Lukes MD  CC:  no complaints.  History of Present Illness: [name] is a delightful 77 year old male with history congestive heart failure secondary to ischemic cardiomyopathy.  He has a totally occluded LAD on cardiac catheterization with nonobstructive disease elsewhere.  His EF previously in the 25-30% range. Howevere echo earlier this month (04/2010) showed EF 45% with mod-sev MR. He is status post BiV ICD.  He has also had problems with atrial fibrillation and undergone several cardioversions but now with chronic atrial fib.   At his last visit we increased back-up rate from 60-70. Says he definitely feels better. More energy. Watching weight very closely and uses sliding scale lasix effectively. Volume status has been very good no   No CP, orthopnea, PND or edema. Weight very stable. No ICD firings  ICD interrogated in clinic: Confirms atiral fib. Optivol remains flat. HR 60-70s. Activity index low 1-2 hrs/day  Current Medications (verified): 1)  Lasix 40 Mg Tabs (Furosemide) .Marland Kitchen.. 1 Once Daily 2)  Zyrtec Allergy 10 Mg Tabs (Cetirizine Hcl) .Marland Kitchen.. 1 Qd 3)  Aspirin 81 Mg  Tbec (Aspirin) .... One By Mouth Every Day 4)  Ocuvite  Tabs (Multiple Vitamins-Minerals) .Marland Kitchen.. 1 Once Daily 5)  Potassium Chloride 20 Meq  Pack (Potassium Chloride) .Marland Kitchen.. 1 Once Daily 6)  Lisinopril 10 Mg  Tabs (Lisinopril) .... Take 1/2  Tab By Mouth Daily 7)  Omeprazole 20 Mg Tbec (Omeprazole) .Marland Kitchen.. 1 Once Daily 8)  Flomax 0.4 Mg Cp24 (Tamsulosin Hcl) .... Take 2 Capsule By Mouth Daily 9)  Finasteride 5 Mg Tabs (Finasteride) .Marland Kitchen.. 1 Once Daily 10)  Melatonin 3 Mg Caps (Melatonin) .... At Bedtime 11)  Pravastatin Sodium 40 Mg Tabs (Pravastatin Sodium) .... Take One Tablet By Mouth Daily At Bedtime 12)  Coumadin 5 Mg Tabs (Warfarin Sodium) .... Take As Directed By Coumadin Clinic. 13)  Vitamin D (Ergocalciferol) 50000 Unit Caps (Ergocalciferol)  .... Take 1 Q Friday 14)  Clonazepam 0.5 Mg Tabs (Clonazepam) .... 1/2-1 Tab By Mouth  Two Times A Day As Needed 15)  Mirtazapine 15 Mg Tabs (Mirtazapine) .... 1/2 Tab At Bedtime As Needed 16)  Astelin 137 Mcg/spray Soln (Azelastine Hcl) .Marland Kitchen.. 1 Spray Each Nostril  Every Morning 17)  Fluticasone Propionate 50 Mcg/act Susp (Fluticasone Propionate) .Marland Kitchen.. 1 Spray Each Nostril  Every Morning 18)  Systane 0.4-0.3 % Soln (Polyethyl Glycol-Propyl Glycol) .... Left Eye At Bedtime 19)  Doxazosin Mesylate 4 Mg Tabs (Doxazosin Mesylate) .Marland Kitchen.. 1 At Bedtime - May Try Stopping This Med For 1-2 Nights and See What Happens With Your Dry Mouth and Bladder  Allergies (verified): 1)  ! Pcn  Vital Signs:  Patient profile:   77 year old male Height:      68 inches Weight:      166 pounds BMI:     25.33 Pulse rate:   71 / minute BP sitting:   124 / 70  (left arm) Cuff size:   regular  Vitals Entered By: Hardin Negus, RMA (Apr 18, 2010 2:23 PM)  Physical Exam  General:  frail kyphotic. no resp difficulty HEENT: normal Neck: supple. JVP flat Carotids 2+ bilat; no bruits. No lymphadenopathy or thryomegaly appreciated. Cor: PMI nondisplaced. Regular rate & rhythm. No rubs, gallops,3/6 SEM at apex Lungs: clear Abdomen: soft, nontender, nondistended. No hepatosplenomegaly. No bruits or masses. Good bowel sounds. Extremities: no cyanosis, clubbing, rash, edema Neuro: alert & orientedx3, cranial  nerves grossly intact. moves all 4 extremities w/o difficulty. affect pleasant      ICD Specifications Following MD:  Lewayne Bunting, MD     Referring MD:  BENSIMHON ICD Vendor:  Medtronic     ICD Model Number:  [id number]     ICD Serial Number:  [id number] ICD DOI:  09/05/2006     ICD Implanting MD:  Lewayne Bunting, MD  Lead 1:    Location: RA     DOI: 08/14/2005     Model #: 6962     Serial #: [id number]     Status: active Lead 2:    Location: RV     DOI: 09/05/2006     Model #: 0102     Serial #: [id number]      Status: active Lead 3:    Location: LV     DOI: 09/05/2006     Model #: 4194     Serial #: [id number]     Status: active Lead 4:    Location: RA     DOI: 08/14/2005     Model #: 3875     Serial #: [id number]     Status: capped  Indications::  ICM, CHF   ICD Follow Up Battery Voltage:  2.81 V     Charge Time:  10.9 seconds     Underlying rhythm:  Afib ICD Dependent:  Yes       ICD Device Measurements Atrium:  Amplitude: 1.4 mV, Impedance: 424 ohms,  Right Ventricle:  Amplitude: 3.8 mV, Impedance: 464 ohms, Threshold: 1.0 V at 0.4 msec Left Ventricle:  Impedance: 336 ohms, Threshold: 2.0 V at 1.2 msec Shock Impedance: 43/51 ohms   Episodes MS Episodes:  22     Percent Mode Switch:  100%     Coumadin:  Yes Shock:  0     ATP:  0     Nonsustained:  0     Atrial Therapies:  0 Atrial Pacing:  0.8%     Ventricular Pacing:  97.3%  Brady Parameters Mode DDDR     Lower Rate Limit:  70     Upper Rate Limit 130 PAV 130     Sensed AV Delay:  100  Tachy Zones VF:  200     VT:  250 FVT VIA VF     VT1:  200     Next Cardiology Appt Due:  07/19/2010 Tech Comments:  Pt is feeling better w/lower rate set at 70bpm.  Pt in Afib 100% of time.  Pt takes coumadin.  Changed LV amplitude to 3 V due to LV threshold being 2.0 @ 1.65ms and changed lower rate to 75 bpm.  Normal Optivol level.  Carelink check 07-19-10.  Vella Kohler  Apr 18, 2010 3:03 PM  Impression & Recommendations:  Problem # 1:  SYSTOLIC HEART FAILURE, CHRONIC (ICD-428.22) EF improved on recent echo. Symptomatically much better with increased back-up rate. Will increase to 75 bpm. Unbale to tolerate b-blocker due to severe side effects. may consider low-dose spiro in the future though I worry about side effects at his age.  Problem # 2:  CAD, NATIVE VESSEL (ICD-414.01) Stable. No evidence of ischemia. Continue current regimen.  Problem # 3:  ATRIAL FIBRILLATION (ICD-427.31) Chronic. Continue coumadin.  Other Orders: EKG w/  Interpretation (93000)  Patient Instructions: 1)  Follow up in 2 months

## 2011-01-01 NOTE — Progress Notes (Signed)
Summary: tamsulosin  Phone Note Refill Request Message from:  Fax from Pharmacy on March 16, 2010 4:50 PM  Refills Requested: Medication #1:  FLOMAX 0.4 MG CP24 Take 2 capsule by mouth daily   Last Refilled: 02/05/2010  Method Requested: Electronic Initial call taken by: Orlan Leavens,  March 16, 2010 4:50 PM    Prescriptions: FLOMAX 0.4 MG CP24 (TAMSULOSIN HCL) Take 2 capsule by mouth daily  #60 x 5   Entered by:   Orlan Leavens   Authorized by:   Newt Lukes MD   Signed by:   Orlan Leavens on 03/16/2010   Method used:   Electronically to        [id number] (retail)       [address]       [place], [place]  [address]       Ph: [phone] or [phone]       Fax: [phone]   RxID:   [id number]

## 2011-01-01 NOTE — Assessment & Plan Note (Signed)
Summary: 6 MO ROV /NWS   Vital Signs:  Patient profile:   77 year old male Height:      68 inches (172.72 cm) Weight:      167.2 pounds (76.00 kg) O2 Sat:      90 % on Room air Temp:     97.4 degrees F (36.33 degrees C) oral Pulse rate:   75 / minute BP sitting:   112 / 64  (left arm) Cuff size:   regular  Vitals Entered By: Orlan Leavens RMA (August 21, 2010 2:41 PM)  O2 Flow:  Room air CC: 6 MONTH FOLLOW-UP Is Patient Diabetic? No Pain Assessment Patient in pain? no      Comments FLU SHOT   Primary Care Provider:  Newt Lukes MD  CC:  6 MONTH FOLLOW-UP.  History of Present Illness: here for 6 mo f/u  1) dyslipidemia - reports compliance with ongoing medical treatment and no changes in medication dose or frequency. denies adverse side effects related to current therapy. no G or muscle c/o  2) HTN - reports compliance with ongoing medical treatment and no changes in medication dose or frequency. denies adverse side effects related to current therapy. no HA or vision changes, no weakness  3) chronic CHF - reports compliance with ongoing medical treatment and no changes in medication dose or frequency. denies adverse side effects related to current therapy. weight stable - no swelling of legs or SOB  4) PAF -  changes in amio reviewed (now off amio, rate control only) -  no bleeding with anticoag; no changein bruising -   Clinical Review Panels:  Immunizations   Last Flu Vaccine:  Fluvax 3+ (08/21/2010)  Lipid Management   Cholesterol:  181 (11/08/2009)   LDL (bad choesterol):  89 (11/08/2009)   HDL (good cholesterol):  78.60 (11/08/2009)   Triglycerides:  109 (06/01/2008)  CBC   WBC:  6.0 (03/28/2010)   RBC:  3.56 (03/28/2010)   Hgb:  12.6 (03/28/2010)   Hct:  36.0 (03/28/2010)   Platelets:  190.0 (03/28/2010)   MCV  101.4 (03/28/2010)   MCHC  34.9 (03/28/2010)   RDW  14.1 (03/28/2010)   PMN:  58.8 (03/28/2010)   Lymphs:  28.8 (03/28/2010)   Monos:  10.8 (03/28/2010)   Eosinophils:  1.1 (03/28/2010)   Basophil:  0.5 (03/28/2010)  Complete Metabolic Panel   Glucose:  123 (03/28/2010)   Sodium:  143 (03/28/2010)   Potassium:  4.1 (03/28/2010)   Chloride:  102 (03/28/2010)   CO2:  32 (03/28/2010)   BUN:  24 (03/28/2010)   Creatinine:  1.2 (03/28/2010)   Albumin:  4.4 (11/08/2009)   Total Protein:  7.2 (11/08/2009)   Calcium:  9.3 (03/28/2010)   Total Bili:  1.0 (11/08/2009)   Alk Phos:  43 (11/08/2009)   SGPT (ALT):  20 (11/08/2009)   SGOT (AST):  28 (11/08/2009)   Current Medications (verified): 1)  Lasix 40 Mg Tabs (Furosemide) .Marland Kitchen.. 1 Once Daily 2)  Zyrtec Allergy 10 Mg Tabs (Cetirizine Hcl) .Marland Kitchen.. 1 Qd 3)  Aspirin 81 Mg  Tbec (Aspirin) .... One By Mouth Every Day 4)  Ocuvite  Tabs (Multiple Vitamins-Minerals) .Marland Kitchen.. 1 Once Daily 5)  Potassium Chloride 20 Meq  Pack (Potassium Chloride) .Marland Kitchen.. 1 Once Daily 6)  Lisinopril 10 Mg  Tabs (Lisinopril) .... Take 1/2  Tab By Mouth Daily 7)  Omeprazole 20 Mg Tbec (Omeprazole) .Marland Kitchen.. 1 Once Daily 8)  Flomax 0.4 Mg Cp24 (Tamsulosin Hcl) .... Take 2  Capsule By Mouth Daily 9)  Finasteride 5 Mg Tabs (Finasteride) .Marland Kitchen.. 1 Once Daily 10)  Melatonin 3 Mg Caps (Melatonin) .... At Bedtime 11)  Pravastatin Sodium 40 Mg Tabs (Pravastatin Sodium) .... Take One Tablet By Mouth Daily At Bedtime 12)  Coumadin 5 Mg Tabs (Warfarin Sodium) .... Take As Directed By Coumadin Clinic. 13)  Vitamin D (Ergocalciferol) 50000 Unit Caps (Ergocalciferol) .... Take 1 Q Friday 14)  Clonazepam 0.5 Mg Tabs (Clonazepam) .... 1/2-1 Tab By Mouth  Two Times A Day As Needed 15)  Mirtazapine 15 Mg Tabs (Mirtazapine) .... 1/2 Tab At Bedtime As Needed 16)  Astelin 137 Mcg/spray Soln (Azelastine Hcl) .Marland Kitchen.. 1 Spray Each Nostril  Every Morning 17)  Fluticasone Propionate 50 Mcg/act Susp (Fluticasone Propionate) .Marland Kitchen.. 1 Spray Each Nostril  Every Morning 18)  Systane 0.4-0.3 % Soln (Polyethyl Glycol-Propyl Glycol) .... Left Eye At  Bedtime 19)  Doxazosin Mesylate 4 Mg Tabs (Doxazosin Mesylate) .Marland Kitchen.. 1 At Bedtime  Allergies (verified): 1)  ! Pcn  Past History:  Past Medical History:  1. CHF due to ischemic CM          a. Echo 8/08 EF: 20-30% moderate to severe MR, mild AI  2. Atrial fib -anticoag         a. Amiodarone stopped due to recurrent AF 1/11  3.  CAD         a. s/p cath 9/06 chronic total occlusion LAD with right-to-left and left-to-left collaterals,               minimal nonobstructive CAD, in LCX and RCA. EF was 45% at that time.  4. Moderate to severe, MR  5. History of anemia secondary to GI bleed.  6. History of symptomatic bradycardia         a. s/p BiV ICD  7. Hypertension.  8. Hyperlipidemia  9. Retinal bleeding after cataract removal 10. Diverticulitis. 11. Osteoporosis with severe kyphosis 12. BPH 13. Hiatal hernia. 14. Glaucoma. 15. Post-op urinary retention  MD roster: cards - benshimon  Review of Systems  The patient denies fever, syncope, headaches, and abdominal pain.    Physical Exam  General:  frail kyphotic - alert, well-developed, well-nourished, and cooperative to examination.   wife  at side Lungs:  normal respiratory effort, no intercostal retractions or use of accessory muscles; normal breath sounds bilaterally - no crackles and no wheezes.    Heart:  normal rate, regular rhythm, no murmur, and no rub. BLE with min edema, R 1+ and L trace (chronic R>L).  Psych:  Oriented X3, memory intact for recent and remote, normally interactive, good eye contact, not anxious appearing, not depressed appearing, and not agitated.      Impression & Recommendations:  Problem # 1:  HYPERLIPIDEMIA-MIXED (ICD-272.4)  His updated medication list for this problem includes:    Pravastatin Sodium 40 Mg Tabs (Pravastatin sodium) .Marland Kitchen... Take one tablet by mouth daily at bedtime  Labs Reviewed: SGOT: 28 (11/08/2009)   SGPT: 20 (11/08/2009)   HDL:78.60 (11/08/2009), 47 (06/01/2008)  LDL:89  (11/08/2009), 165 (06/01/2008)  Chol:181 (11/08/2009), 234 (06/01/2008)  Trig:65.0 (11/08/2009), 109 (06/01/2008)  Problem # 2:  ATRIAL FIBRILLATION (ICD-427.31)  His updated medication list for this problem includes:    Aspirin 81 Mg Tbec (Aspirin) ..... One by mouth every day    Coumadin 5 Mg Tabs (Warfarin sodium) .Marland Kitchen... Take as directed by coumadin clinic.  His ventricular rate is well controlled.  Continue meds as ongoing - f/u cards  Reviewed the following: PT: 29.7 (09/26/2009)   INR: 2.9 ratio (09/26/2009) Coumadin Dose (weekly): 30 mg (07/31/2010) Prior Coumadin Dose (weekly): 30 mg (07/31/2010) Next Protime: 08/28/2010 (dated on 07/31/2010)  Problem # 3:  SYSTOLIC HEART FAILURE, CHRONIC (ICD-428.22)  His updated medication list for this problem includes:    Lasix 40 Mg Tabs (Furosemide) .Marland Kitchen... 1 once daily    Aspirin 81 Mg Tbec (Aspirin) ..... One by mouth every day    Lisinopril 10 Mg Tabs (Lisinopril) .Marland Kitchen... Take 1/2  tab by mouth daily    Coumadin 5 Mg Tabs (Warfarin sodium) .Marland Kitchen... Take as directed by coumadin clinic.  He remains class 2-3.  continue low sodium diet as recommended.  Echocardiogram:  - Left ventricle: The cavity size was normal. Wall thickness was     normal.   - Aortic valve: Trivial regurgitation.   - Mitral valve: Moderate to severe regurgitation.   - Left atrium: The atrium was moderately dilated.   - Right atrium: The atrium was mildly to moderately dilated. (04/16/2010)  Problem # 4:  HYPERTENSION, BENIGN (ICD-401.1)  His updated medication list for this problem includes:    Lasix 40 Mg Tabs (Furosemide) .Marland Kitchen... 1 once daily    Lisinopril 10 Mg Tabs (Lisinopril) .Marland Kitchen... Take 1/2  tab by mouth daily    Doxazosin Mesylate 4 Mg Tabs (Doxazosin mesylate) .Marland Kitchen... 1 at bedtime  BP today: 112/64 Prior BP: 108/72 (07/10/2010)  Labs Reviewed: K+: 4.1 (03/28/2010) Creat: : 1.2 (03/28/2010)   Chol: 181 (11/08/2009)   HDL: 78.60 (11/08/2009)   LDL: 89  (11/08/2009)   TG: 65.0 (11/08/2009)  Complete Medication List: 1)  Lasix 40 Mg Tabs (Furosemide) .Marland Kitchen.. 1 once daily 2)  Zyrtec Allergy 10 Mg Tabs (Cetirizine hcl) .Marland Kitchen.. 1 qd 3)  Aspirin 81 Mg Tbec (Aspirin) .... One by mouth every day 4)  Ocuvite Tabs (Multiple vitamins-minerals) .Marland Kitchen.. 1 once daily 5)  Potassium Chloride 20 Meq Pack (Potassium chloride) .Marland Kitchen.. 1 once daily 6)  Lisinopril 10 Mg Tabs (Lisinopril) .... Take 1/2  tab by mouth daily 7)  Omeprazole 20 Mg Tbec (Omeprazole) .Marland Kitchen.. 1 once daily 8)  Flomax 0.4 Mg Cp24 (Tamsulosin hcl) .... Take 2 capsule by mouth daily 9)  Finasteride 5 Mg Tabs (Finasteride) .Marland Kitchen.. 1 once daily 10)  Melatonin 3 Mg Caps (Melatonin) .... At bedtime 11)  Pravastatin Sodium 40 Mg Tabs (Pravastatin sodium) .... Take one tablet by mouth daily at bedtime 12)  Coumadin 5 Mg Tabs (Warfarin sodium) .... Take as directed by coumadin clinic. 13)  Vitamin D (ergocalciferol) 50000 Unit Caps (Ergocalciferol) .... Take 1 q friday 14)  Clonazepam 0.5 Mg Tabs (Clonazepam) .... 1/2-1 tab by mouth  two times a day as needed 15)  Mirtazapine 15 Mg Tabs (Mirtazapine) .... 1/2 tab at bedtime as needed 16)  Astelin 137 Mcg/spray Soln (Azelastine hcl) .Marland Kitchen.. 1 spray each nostril  every morning 17)  Fluticasone Propionate 50 Mcg/act Susp (Fluticasone propionate) .Marland Kitchen.. 1 spray each nostril  every morning 18)  Systane 0.4-0.3 % Soln (Polyethyl glycol-propyl glycol) .... Left eye at bedtime 19)  Doxazosin Mesylate 4 Mg Tabs (Doxazosin mesylate) .Marland Kitchen.. 1 at bedtime  Other Orders: Flu Vaccine 9yrs + MEDICARE PATIENTS (Z6109) Administration Flu vaccine - MCR (U0454) Flu Vaccine Consent Questions     Do you have a history of severe allergic reactions to this vaccine? no    Any prior history of allergic reactions to egg and/or gelatin? no    Do you have a sensitivity  to the preservative Thimersol? no    Do you have a past history of Guillan-Barre Syndrome? no    Do you currently have an  acute febrile illness? no    Have you ever had a severe reaction to latex? no    Vaccine information given and explained to patient? yes    Are you currently pregnant? no    Lot Number:[id number]   Exp Date:06/01/2011   Site Given  Left Deltoid IM Flu Vaccine 44yrs + MEDICARE PATIENTS (Z6109) Administration Flu vaccine - MCR (U0454)  Patient Instructions: 1)  it was good to see you today. 2)  no labs needed or medicine changes today - 3)  Please schedule for your annual visit and labs in december (approx 3 months), call sooner if problems.   Marland Kitchenlbmedflu

## 2011-01-01 NOTE — Medication Information (Signed)
Summary: rov/tm  Anticoagulant Therapy  Managed by: Bethena Midget, RN, BSN PCP: Newt Lukes MD Supervising MD: Daleen Squibb MD, Maisie Fus Indication 1: Atrial Fibrillation (ICD-427.31) Lab Used: [facility] Site: [facility] INR POC 2.5 INR RANGE 2 - 3  Dietary changes: no    Health status changes: no    Bleeding/hemorrhagic complications: no    Recent/future hospitalizations: no    Any changes in medication regimen? no    Recent/future dental: no  Any missed doses?: no       Is patient compliant with meds? yes      Comments: Has appt. with Dr. Gala Romney on 4/14 and wants to combine.   Allergies: 1)  ! Pcn  Anticoagulation Management History:      The patient is taking warfarin and comes in today for a routine follow up visit.  Positive risk factors for bleeding include an age of 75 years or older.  The bleeding index is 'intermediate risk'.  Positive CHADS2 values include History of CHF, History of HTN, and Age > 54 years old.  The start date was 01/30/2006.  His last INR was 2.9 ratio.  Anticoagulation responsible provider: Daleen Squibb MD, Maisie Fus.  INR POC: 2.5.  Cuvette Lot#: [id number].  Exp: 04/2011.    Anticoagulation Management Assessment/Plan:      The patient's current anticoagulation dose is Coumadin 5 mg tabs: Take as directed by coumadin clinic..  The target INR is 2.0-3.0.  The next INR is due 03/15/2010.  Anticoagulation instructions were given to patient/wife.  Results were reviewed/authorized by Bethena Midget, RN, BSN.  He was notified by Bethena Midget, RN, BSN.         Prior Anticoagulation Instructions: INR 2.3 Continue 1/2 pill everyday except 1 pill on Mondays, Wednesdays and Fridays. Recheck in 4 weeks.   Current Anticoagulation Instructions: INR 2.5 Continue 2.5mg  daily except 5mg s on Mondays, Wednesdays, and Fridays. Recheck in 4 weeks.

## 2011-01-01 NOTE — Progress Notes (Signed)
Summary: pt weak and b/p dropping  Phone Note Call from Patient Call back at Home Phone [phone]   Caller: Spouse Reason for Call: Talk to Nurse, Talk to Doctor Summary of Call: per spouse pt b/p has been dropping really low and they want to know what to do cause it makes the pt really weak Initial call taken by: Omer Jack,  March 19, 2010 9:25 AM  Follow-up for Phone Call        BP is dropping running in 90s, 88 was lowest, pt is very fatigued, wife reports BP ok early am but drops midmorning and pt feels bad, will hold lasix this am, will discuss w/Dr Bensimhon and call back Meredith Staggers, RN  March 19, 2010 10:03 AM   Additional Follow-up for Phone Call Additional follow up Details #1::        hold lasix for 2 days. please check optivol and show me report. when does he have f/u with me? Dolores Patty, MD, [facility]  March 19, 2010 4:33 PM  pts wife aware, will hold lasix and send transmission, pt is sch for f/u 4/28 Meredith Staggers, RN  March 19, 2010 5:29 PM

## 2011-01-01 NOTE — Progress Notes (Signed)
Summary: Pt right is blurry was told to call Dr.Deetya Drouillard  Phone Note Call from Patient Call back at [phone]   Caller: Daughter/ Corrie Dandy Summary of Call: One of the pt eyes is very blurry Initial call taken by: Judie Grieve,  July 20, 2010 3:01 PM  Follow-up for Phone Call        pt has had a change in his vision.  Going to see his eye doctor now.  Corrie Dandy states they were told to let Dr Gala Romney know if there were changes.  She will call back to let us know what eye dr says. Follow-up by: Charolotte Capuchin, RN,  July 20, 2010 3:23 PM  Additional Follow-up for Phone Call Additional follow up Details #1::        thanks. Dolores Patty, MD, [facility]  July 25, 2010 9:28 AM

## 2011-01-01 NOTE — Assessment & Plan Note (Signed)
Summary: rov/appt time is 2:45pm/hm   Visit Type:  Follow-up Primary Provider:  Newt Lukes MD   History of Present Illness: [name] returns today for PPM incision followup.  He is a pleasant 77 yo man with a DCM who underwent BiV ICD removal (ERI) and insertion of a BiV PPM nearly a week ago.  He was kept on coumadin but has developed a large hematoma and is back today for followup.  He has not had any active bleeding but does have extensive ecchymosis.  At the time of his procedure there was extensive debridement required of his extensive scar tissue.  He is also on low dose ASA.  Current Medications (verified): 1)  Lasix 40 Mg Tabs (Furosemide) .Marland Kitchen.. 1 Once Daily 2)  Zyrtec Allergy 10 Mg Tabs (Cetirizine Hcl) .Marland Kitchen.. 1 Qd 3)  Aspirin 81 Mg  Tbec (Aspirin) .... One By Mouth Every Day 4)  Ocuvite  Tabs (Multiple Vitamins-Minerals) .Marland Kitchen.. 1 Once Daily 5)  Potassium Chloride 20 Meq  Pack (Potassium Chloride) .Marland Kitchen.. 1 Once Daily 6)  Lisinopril 10 Mg  Tabs (Lisinopril) .... Take 1/2  Tab By Mouth Daily 7)  Omeprazole 20 Mg Tbec (Omeprazole) .Marland Kitchen.. 1 Once Daily 8)  Finasteride 5 Mg Tabs (Finasteride) .Marland Kitchen.. 1 Once Daily 9)  Melatonin 3 Mg Caps (Melatonin) .... At Bedtime 10)  Pravastatin Sodium 40 Mg Tabs (Pravastatin Sodium) .... Take One Tablet By Mouth Daily At Bedtime 11)  Coumadin 5 Mg Tabs (Warfarin Sodium) .... Take As Directed By Coumadin Clinic. 12)  Vitamin D (Ergocalciferol) 50000 Unit Caps (Ergocalciferol) .... Take 1 Q Friday 13)  Clonazepam 0.5 Mg Tabs (Clonazepam) .... 1/2-1 Tab By Mouth  Two Times A Day As Needed 14)  Mirtazapine 15 Mg Tabs (Mirtazapine) .... 1/2 Tab At Bedtime As Needed 15)  Astelin 137 Mcg/spray Soln (Azelastine Hcl) .Marland Kitchen.. 1 Spray Each Nostril  Every Morning 16)  Fluticasone Propionate 50 Mcg/act Susp (Fluticasone Propionate) .Marland Kitchen.. 1 Spray Each Nostril  Every Morning 17)  Systane 0.4-0.3 % Soln (Polyethyl Glycol-Propyl Glycol) .... Left Eye At Bedtime 18)   Doxazosin Mesylate 4 Mg Tabs (Doxazosin Mesylate) .Marland Kitchen.. 1 At Bedtime 19)  Flomax 0.4 Mg Caps (Tamsulosin Hcl) .... Take 2 By Mouth Once Daily 20)  Zoloft 25 Mg Tabs (Sertraline Hcl) .... Take Half Tablet Daily For 4 Days. Then Take 1 Tablet For 14 Days. Then Take 2 Tablets By Mouth Daily  Allergies: 1)  ! Pcn  Past History:  Past Medical History: Last updated: 09/17/2010  1. CHF due to ischemic CM          a. Echo 8/08 EF: 20-30% moderate to severe MR, mild AI  2. Atrial fib -anticoag         a. Amiodarone stopped due to recurrent AF 1/11  3.  CAD         a. s/p cath 9/06 chronic total occlusion LAD with right-to-left and left-to-left collaterals,               minimal nonobstructive CAD, in LCX and RCA. EF was 45% at that time.  4. Moderate to severe, MR  5. History of anemia secondary to GI bleed.  6. History of symptomatic bradycardia         a. s/p BiV ICD  7. Hypertension.  8. Hyperlipidemia  9. Retinal bleeding after cataract removal 10. Diverticulitis. 11. Osteoporosis with severe kyphosis 12. BPH 13. Hiatal hernia. 14. Glaucoma. 15. Post-op urinary retention  MD roster:  cards - benshimon  Past Surgical History: Last updated: 08/30/2009 02/05/2008 -Cardioversion 09/05/2006-Removal of a previously implanted dual-chamber pacemaker generator, and insertion of a new biventricular ICD. 03/17/2006 -Direct current cardioversion. Inguinal herniorrhaphy Surgery on his hand Eye surgery 2009 & 2010  Review of Systems  The patient denies chest pain, syncope, dyspnea on exertion, and peripheral edema.    Vital Signs:  Patient profile:   77 year old male Height:      68 inches Weight:      168 pounds BMI:     25.64 Pulse rate:   75 / minute BP sitting:   116 / 62  (left arm)  Vitals Entered By: Laurance Flatten CMA (November 06, 2010 3:15 PM)  Physical Exam  General:   frail kyphotic. no resp difficulty HEENT: normal Neck: supple. JVP 6 Carotids 2+ bilat; no  bruits. No lymphadenopathy or thryomegaly appreciated. Cor: PMI nondisplaced. Regular rate & rhythm. No rubs, gallops,3/6 SEM at apex Lungs: clear. Large area of ecchymosis and hematoma are present. Abdomen: soft, nontender, nondistended. No hepatosplenomegaly. No bruits or masses. Good bowel sounds. Extremities: no cyanosis, clubbing, rash, edema Neuro: alert & orientedx3, cranial nerves grossly intact. moves all 4 extremities w/o difficulty. affect pleasant. no drift.    PPM Specifications Following MD:  Lewayne Bunting, MD     PPM Vendor:  [employer]     PPM Model Number:  [id number]     PPM Serial Number:  [id number] PPM DOI:  10/31/2010     PPM Implanting MD:  Lewayne Bunting, MD  Lead 1    Location: RA     DOI: 08/14/2005     Model #: [id number]     Serial #: [id number]     Status: active Lead 2    Location: RV     DOI: 08/14/2005     Model #: [id number]     Serial #: [id number]     Status: active Lead 3    Location: LV     DOI: 09/05/2006     Model #: [id number]     Serial #: [id number]     Status: active  Magnet Response Rate:  BOL 85 ERI 65  Indications:  CM   PPM Follow Up Pacer Dependent:  Yes      Parameters Mode:  vvi     Lower Rate Limit:  75     Upper Rate Limit:  120  ICD Specifications Following MD:  Lewayne Bunting, MD     Referring MD:  BENSIMHON ICD Vendor:  [employer]     ICD Model Number:  [id number]     ICD Serial Number:  [id number] ICD DOI:  09/05/2006     ICD Implanting MD:  Lewayne Bunting, MD  Lead 1:    Location: RA     DOI: 08/14/2005     Model #: [id number]     Serial #: [id number]     Status: active Lead 2:    Location: RV     DOI: 09/05/2006     Model #: [id number]     Serial #: [id number]     Status: active Lead 3:    Location: LV     DOI: 09/05/2006     Model #: [id number]     Serial #: [id number]     Status: active Lead 4:    Location: RA     DOI: 08/14/2005     Model #: [id number]     Serial #: [id number]  Status: capped  Indications::  ICM, CHF  Explantation Comments: 10/31/2010 [employer]  [id number]  ICD Follow Up ICD Dependent:  Yes      Episodes Coumadin:  Yes  Brady Parameters Mode DDDR     Lower Rate Limit:  75     Upper Rate Limit 130 PAV 130     Sensed AV Delay:  100  Tachy Zones VF:  200     VT:  250 FVT VIA VF     VT1:  200     Impression & Recommendations:  Problem # 1:  AUTOMATIC IMPLANTABLE CARDIAC DEFIBRILLATOR SITU (ICD-V45.02) His PPM hematoma is large and I have asked him to stop ASA.  Hopefully it will not bleed. Will recheck in several days.  Problem # 2:  ATRIAL FIBRILLATION (ICD-427.31) His rates are well controlled. His updated medication list for this problem includes:    Aspirin 81 Mg Tbec (Aspirin) ..... One by mouth every day    Coumadin 5 Mg Tabs (Warfarin sodium) .Marland Kitchen... Take as directed by coumadin clinic.  Patient Instructions: 1)  Your physician recommends that you schedule a follow-up appointment on Monday with device clinic 2)  stop Aspirin and stay off Coumadin for now 3)  If you start bleeding call the office [phone] and we will admit you to the hospital

## 2011-01-01 NOTE — Cardiovascular Report (Signed)
Summary: Office Visit Remote   Office Visit Remote   Imported By: Roderic Ovens 11/02/2010 15:05:47  _____________________________________________________________________  External Attachment:    Type:   Image     Comment:   External Document

## 2011-01-01 NOTE — Medication Information (Signed)
Summary: rov/sel  Anticoagulant Therapy  Managed by: Weston Brass, PharmD PCP: Newt Lukes MD Supervising MD: Myrtis Ser MD, Tinnie Gens Indication 1: Atrial Fibrillation (ICD-427.31) Lab Used: [facility] Site: [facility] INR POC 1.9 INR RANGE 2 - 3  Dietary changes: yes       Details: Ate a little more cabbage this week  Health status changes: no    Bleeding/hemorrhagic complications: no    Recent/future hospitalizations: yes       Details: Having new pacemaker inserted  Any changes in medication regimen? no    Recent/future dental: no  Any missed doses?: no       Is patient compliant with meds? yes      Comments: Blood work on 11/23 to determine if warfarin will need to be stopped prior to operation per pt Pt educated on consistent diet  Allergies: 1)  ! Pcn  Anticoagulation Management History:      The patient is taking warfarin and comes in today for a routine follow up visit.  Positive risk factors for bleeding include an age of 7 years or older.  The bleeding index is 'intermediate risk'.  Positive CHADS2 values include History of CHF, History of HTN, and Age > 75 years old.  The start date was 01/30/2006.  His last INR was 2.9 ratio.  Anticoagulation responsible provider: Myrtis Ser MD, Tinnie Gens.  INR POC: 1.9.  Cuvette Lot#: [id number].  Exp: 10/2011.    Anticoagulation Management Assessment/Plan:      The patient's current anticoagulation dose is Coumadin 5 mg tabs: Take as directed by coumadin clinic..  The target INR is 2.0-3.0.  The next INR is due 11/14/2010.  Anticoagulation instructions were given to patient/wife.  Results were reviewed/authorized by Weston Brass, PharmD.  He was notified by Hoy Register, PharmD Candidate.         Prior Anticoagulation Instructions: INR 2.0  Continue taking same dose of 1 tablet everyday except 1/2 tablet on Monday and Thursday. Recheck in 4 weeks.   Current Anticoagulation Instructions: INR 1.9 Take 1.5 tablets today then resume  previous dose of 1 tablet everyday except 0.5 tablet on Monday and Thursday Recheck INR in

## 2011-01-01 NOTE — Assessment & Plan Note (Signed)
Summary: f19m/device check at 2pm let christian no when pt arrive.sl   Primary Provider:  Newt Lukes MD   History of Present Illness: [name] is a delightful 77 year old male with history congestive heart failure secondary to ischemic cardiomyopathy.  He has a totally occluded LAD on cardiac catheterization with nonobstructive disease elsewhere.  His EF previously in the 25-30% range. [place] echo 04/2010 showed EF 45% with mod-sev MR. He is status post BiV ICD.  He has also had problems with atrial fibrillation and undergone several cardioversions (and severeal anti-arrhhtymics) but now with chronic atrial fib.  He has class 2 symptoms. His ICD has reached ERI.  Presents with his wife and daughter for routine f/u. Says he is having a bit of a hard time. Family says he is more anxious than usual. Seems very depressed and worried to them. Denies CP. Does have chronic dyspnea but this not changed. Says he can do almost anything he wants to do. No orthopnea, PND or edema.  Saw Dr. Ladona Ridgel yesterday and told his device was ERI. Recommending change of device from BiVICD to BIV PPM.   Current Medications (verified): 1)  Lasix 40 Mg Tabs (Furosemide) .Marland Kitchen.. 1 Once Daily 2)  Zyrtec Allergy 10 Mg Tabs (Cetirizine Hcl) .Marland Kitchen.. 1 Qd 3)  Aspirin 81 Mg  Tbec (Aspirin) .... One By Mouth Every Day 4)  Ocuvite  Tabs (Multiple Vitamins-Minerals) .Marland Kitchen.. 1 Once Daily 5)  Potassium Chloride 20 Meq  Pack (Potassium Chloride) .Marland Kitchen.. 1 Once Daily 6)  Lisinopril 10 Mg  Tabs (Lisinopril) .... Take 1/2  Tab By Mouth Daily 7)  Omeprazole 20 Mg Tbec (Omeprazole) .Marland Kitchen.. 1 Once Daily 8)  Finasteride 5 Mg Tabs (Finasteride) .Marland Kitchen.. 1 Once Daily 9)  Melatonin 3 Mg Caps (Melatonin) .... At Bedtime 10)  Pravastatin Sodium 40 Mg Tabs (Pravastatin Sodium) .... Take One Tablet By Mouth Daily At Bedtime 11)  Coumadin 5 Mg Tabs (Warfarin Sodium) .... Take As Directed By Coumadin Clinic. 12)  Vitamin D (Ergocalciferol) 50000 Unit Caps  (Ergocalciferol) .... Take 1 Q Friday 13)  Clonazepam 0.5 Mg Tabs (Clonazepam) .... 1/2-1 Tab By Mouth  Two Times A Day As Needed 14)  Mirtazapine 15 Mg Tabs (Mirtazapine) .... 1/2 Tab At Bedtime As Needed 15)  Astelin 137 Mcg/spray Soln (Azelastine Hcl) .Marland Kitchen.. 1 Spray Each Nostril  Every Morning 16)  Fluticasone Propionate 50 Mcg/act Susp (Fluticasone Propionate) .Marland Kitchen.. 1 Spray Each Nostril  Every Morning 17)  Systane 0.4-0.3 % Soln (Polyethyl Glycol-Propyl Glycol) .... Left Eye At Bedtime 18)  Doxazosin Mesylate 4 Mg Tabs (Doxazosin Mesylate) .Marland Kitchen.. 1 At Bedtime 19)  Flomax 0.4 Mg Caps (Tamsulosin Hcl) .... Take 2 By Mouth Once Daily  Allergies (verified): 1)  ! Pcn  Past History:  Past Medical History: Last updated: 09/17/2010  1. CHF due to ischemic CM          a. Echo 8/08 EF: 20-30% moderate to severe MR, mild AI  2. Atrial fib -anticoag         a. Amiodarone stopped due to recurrent AF 1/11  3.  CAD         a. s/p cath 9/06 chronic total occlusion LAD with right-to-left and left-to-left collaterals,               minimal nonobstructive CAD, in LCX and RCA. EF was 45% at that time.  4. Moderate to severe, MR  5. History of anemia secondary to GI bleed.  6. History of symptomatic  bradycardia         a. s/p BiV ICD  7. Hypertension.  8. Hyperlipidemia  9. Retinal bleeding after cataract removal 10. Diverticulitis. 11. Osteoporosis with severe kyphosis 12. BPH 13. Hiatal hernia. 14. Glaucoma. 15. Post-op urinary retention  MD roster:   cards - benshimon  Review of Systems       As per HPI and past medical history; otherwise all systems negative.   Vital Signs:  Patient profile:   77 year old male Height:      68 inches Weight:      167 pounds BMI:     25.48 Pulse rate:   77 / minute BP sitting:   124 / 62  (left arm) Cuff size:   large  Vitals Entered By: Judithe Modest CMA (October 17, 2010 2:47 PM)  Physical Exam  General:   frail kyphotic. no resp  difficulty HEENT: normal Neck: supple. JVP 6 Carotids 2+ bilat; no bruits. No lymphadenopathy or thryomegaly appreciated. Cor: PMI nondisplaced. Regular rate & rhythm. No rubs, gallops,3/6 SEM at apex Lungs: clear Abdomen: soft, nontender, nondistended. No hepatosplenomegaly. No bruits or masses. Good bowel sounds. Extremities: no cyanosis, clubbing, rash, edema Neuro: alert & orientedx3, cranial nerves grossly intact. moves all 4 extremities w/o difficulty. affect pleasant. no drift.     ICD Specifications Following MD:  Lewayne Bunting, MD     Referring MD:  [facility] ICD Vendor:  [employer]     ICD Model Number:  [id number]     ICD Serial Number:  [id number] ICD DOI:  09/05/2006     ICD Implanting MD:  Lewayne Bunting, MD  Lead 1:    Location: RA     DOI: 08/14/2005     Model #: 1914     Serial #: [id number]     Status: active Lead 2:    Location: RV     DOI: 09/05/2006     Model #: 8657     Serial #: [id number]     Status: active Lead 3:    Location: LV     DOI: 09/05/2006     Model #: 4194     Serial #: [id number]     Status: active Lead 4:    Location: RA     DOI: 08/14/2005     Model #: 0272     Serial #: [id number]     Status: capped  Indications::  ICM, CHF   ICD Follow Up ICD Dependent:  Yes      Episodes Coumadin:  Yes  Brady Parameters Mode DDDR     Lower Rate Limit:  75     Upper Rate Limit 130 PAV 130     Sensed AV Delay:  100  Tachy Zones VF:  200     VT:  250 FVT VIA VF     VT1:  200     Impression & Recommendations:  Problem # 1:  SYSTOLIC HEART FAILURE, CHRONIC (ICD-428.22) Stable. Volume status looks good. Continue current therapy. long talk about merits of BivPM vs BiVICD. haqve chosen to proceed with BiV PM with generator change.  Problem # 2:  ATRIAL FIBRILLATION (ICD-427.31) His symptoms are well controlled and he has rate controlled atrial fib and is mostly pacing. Continue coumadin,.  Problem # 3:  CAD, NATIVE VESSEL (ICD-414.01) Stable. No evidence of  ischemia. Continue current regimen.  Problem # 4:  Depression/anxiety Could not tolerate Lexapro in past. Have placed a call to one of my geriatric  psychiatry friends at [facility] to ask for recommnedations. We will get back to him .Check thyroid/B12/folate.   Addendum recommended: Zoloft. Will start.   Other Orders: TLB-BMP (Basic Metabolic Panel-BMET) (80048-METABOL) TLB-B12 + Folate Pnl (82746_82607-B12/FOL) TLB-TSH (Thyroid Stimulating Hormone) (84443-TSH) TLB-T4 (Thyrox), Free [phone])  Patient Instructions: 1)  Your physician recommends that you schedule a follow-up appointment in: 3-4 months 2)  Your physician recommends that you continue on your current medications as directed. Please refer to the Current Medication list given to you today. Prescriptions: ZOLOFT 25 MG TABS (SERTRALINE HCL) take half tablet daily for 4 days. Then take 1 tablet for 14 days. Then take 2 tablets by mouth daily  #60 x 3   Entered by:   Dossie Arbour, RN, BSN   Authorized by:   Dolores Patty, MD, [facility]   Signed by:   Dossie Arbour, RN, BSN on 10/17/2010   Method used:   Electronically to        [facility] (retail)       [address]       [place], [place]  [address]       Ph: [phone] or [phone]       Fax: [phone]   RxID:   [id number]

## 2011-01-01 NOTE — Medication Information (Signed)
Summary: rov/sp  Anticoagulant Therapy  Managed by: Weston Brass, PharmD PCP: Newt Lukes MD Supervising MD: Gala Romney MD, Reuel Boom Indication 1: Atrial Fibrillation (ICD-427.31) Lab Used: [facility] [place] Site: [facility] INR POC 1.8 INR RANGE 2 - 3  Dietary changes: no    Health status changes: no    Bleeding/hemorrhagic complications: no    Recent/future hospitalizations: no    Any changes in medication regimen? no    Recent/future dental: no  Any missed doses?: no       Is patient compliant with meds? yes       Allergies: 1)  ! Pcn  Anticoagulation Management History:      The patient is taking warfarin and comes in today for a routine follow up visit.  Positive risk factors for bleeding include an age of 75 years or older.  The bleeding index is 'intermediate risk'.  Positive CHADS2 values include History of CHF, History of HTN, and Age > 52 years old.  The start date was 01/30/2006.  His last INR was 2.9 ratio.  Anticoagulation responsible provider: Bensimhon MD, Reuel Boom.  INR POC: 1.8.  Cuvette Lot#: [id number].  Exp: 07/2011.    Anticoagulation Management Assessment/Plan:      The patient's current anticoagulation dose is Coumadin 5 mg tabs: Take as directed by coumadin clinic..  The target INR is 2.0-3.0.  The next INR is due 05/09/2010.  Anticoagulation instructions were given to patient/wife.  Results were reviewed/authorized by Weston Brass, PharmD.  He was notified by Weston Brass PharmD.         Prior Anticoagulation Instructions: INR 1.7  Take 1 tablet today and tomorrow then resume same dose of 1/2 tablet every day except 1 tablet on Monday, Wednesday and Friday   Current Anticoagulation Instructions: INR 1.8  Take 1 1/2 tablets today then increase dose to 1 tablet every day except 1/2 tablet on Sunday, Tuesday and Thursday.

## 2011-01-03 NOTE — Procedures (Signed)
Summary: DEVICE CHECK AT 2:15 OK PER PAULA/SL   Current Medications (verified): 1)  Lasix 40 Mg Tabs (Furosemide) .Marland Kitchen.. 1 Once Daily 2)  Zyrtec Allergy 10 Mg Tabs (Cetirizine Hcl) .Marland Kitchen.. 1 Qd 3)  Ocuvite  Tabs (Multiple Vitamins-Minerals) .Marland Kitchen.. 1 Once Daily 4)  Potassium Chloride 20 Meq  Pack (Potassium Chloride) .Marland Kitchen.. 1 Once Daily 5)  Lisinopril 10 Mg  Tabs (Lisinopril) .... Take 1/2  Tab By Mouth Daily 6)  Omeprazole 20 Mg Tbec (Omeprazole) .Marland Kitchen.. 1 Once Daily 7)  Finasteride 5 Mg Tabs (Finasteride) .Marland Kitchen.. 1 Once Daily 8)  Melatonin 3 Mg Caps (Melatonin) .... At Bedtime 9)  Pravastatin Sodium 40 Mg Tabs (Pravastatin Sodium) .... Take One Tablet By Mouth Daily At Bedtime 10)  Vitamin D (Ergocalciferol) 50000 Unit Caps (Ergocalciferol) .... Take 1 Q Friday 11)  Clonazepam 0.5 Mg Tabs (Clonazepam) .... 1/2-1 Tab By Mouth  Two Times A Day As Needed 12)  Mirtazapine 15 Mg Tabs (Mirtazapine) .... 1/2 Tab At Bedtime As Needed 13)  Astelin 137 Mcg/spray Soln (Azelastine Hcl) .Marland Kitchen.. 1 Spray Each Nostril  Every Morning 14)  Fluticasone Propionate 50 Mcg/act Susp (Fluticasone Propionate) .Marland Kitchen.. 1 Spray Each Nostril  Every Morning 15)  Systane 0.4-0.3 % Soln (Polyethyl Glycol-Propyl Glycol) .... Left Eye At Bedtime 16)  Doxazosin Mesylate 4 Mg Tabs (Doxazosin Mesylate) .Marland Kitchen.. 1 At Bedtime 17)  Flomax 0.4 Mg Caps (Tamsulosin Hcl) .... Take 2 By Mouth Once Daily 18)  Zoloft 25 Mg Tabs (Sertraline Hcl) .... Take Half Tablet Daily For 4 Days. Then Take 1 Tablet For 14 Days. Then Take 2 Tablets By Mouth Daily  Allergies (verified): 1)  ! Pcn  PPM Specifications Following MD:  Lewayne Bunting, MD     PPM Vendor:  [employer]     PPM Model Number:  [phone]     PPM Serial Number:  [id number] PPM DOI:  10/31/2010     PPM Implanting MD:  Lewayne Bunting, MD  Lead 1    Location: RA     DOI: 08/14/2005     Model #: 9147     Serial #: [id number]     Status: active Lead 2    Location: RV     DOI: 08/14/2005     Model #: 8657      Serial #: [id number]     Status: active Lead 3    Location: LV     DOI: 09/05/2006     Model #: 4132     Serial #: [id number]     Status: active  Magnet Response Rate:  BOL 85 ERI 65  Indications:  CM   PPM Follow Up Battery Voltage:  3.03 V     Battery Est. Longevity:  3.5 YRS     Pacer Dependent:  Yes       PPM Device Measurements Atrium  Amplitude: 0.8 mV, Impedance: 437 ohms,  Right Ventricle  Amplitude: 14.6 mV, Impedance: 456 ohms, Threshold: 0.75 V at 0.40 msec Left Ventricle  Impedance: 399 ohms, Threshold: 1.75 V at 0.80 msec  Episodes MS Episodes:  7     Percent Mode Switch:  100%     Ventricular High Rate:  0     Atrial Pacing:  0%     Ventricular Pacing:  93.9%  Parameters Mode:  VVI     Lower Rate Limit:  75     Upper Rate Limit:  120 Next Cardiology Appt Due:  02/12/2011 Tech Comments:  PT IN AF 100% OF  TIME. NORMAL DEVICE FUNCTION.  NO CHANGES MADE. PT HAS HAD POCKET HEMATOMA SINCE CHANGE OUT ON 10-31-10.  COUMADIN AND ASA WAS STOPPED IN [place].  DR Ladona Ridgel CHECKED SITE ON TODAYS CHECK AND SEEMS TO THINK SWELLING HAS GONE DOWN AROUND AREA.  PER GT START COUMADIN 5 MG (1/2 TABLET DAILY) ON 12-13-10 AND HAVE PT CHECK ON 12-17-10.  PT INFORMED TO CALL OFFICE IF SITE BEGINS TO DRAIN.  ROV 02-12-11 @ 1020 W/GT. Vella Kohler  December 05, 2010 5:20 PM  ICD Specifications Following MD:  Lewayne Bunting, MD     Referring MD:  [facility] ICD Vendor:  [employer]     ICD Model Number:  [id number]     ICD Serial Number:  [id number] ICD DOI:  09/05/2006     ICD Implanting MD:  Lewayne Bunting, MD  Lead 1:    Location: RA     DOI: 08/14/2005     Model #: 5076     Serial #: [id number]     Status: active Lead 2:    Location: RV     DOI: 09/05/2006     Model #: 5638     Serial #: [id number]     Status: active Lead 3:    Location: LV     DOI: 09/05/2006     Model #: 4194     Serial #: [id number]     Status: active Lead 4:    Location: RA     DOI: 08/14/2005     Model #: 3016     Serial #:  [id number]     Status: capped  Indications::  ICM, CHF  Explantation Comments: 10/31/2010 [employer] [id number]/PVR  ICD Follow Up ICD Dependent:  Yes      Episodes Coumadin:  Yes  Brady Parameters Mode DDDR     Lower Rate Limit:  75     Upper Rate Limit 130 PAV 130     Sensed AV Delay:  100  Tachy Zones VF:  200     VT:  250 FVT VIA VF     VT1:  200      Patient Instructions: 1)  Start back on Coumadin Thursday, January 12th, 2012 half tablet of 5mg  unless drainage from site.  To have Coumadin checked on Monday, January 16th, 2012.  Pt to call office if drainage starts at device site.

## 2011-01-03 NOTE — Cardiovascular Report (Signed)
Summary: Office Visit   Office Visit   Imported By: Roderic Ovens 11/12/2010 14:07:06  _____________________________________________________________________  External Attachment:    Type:   Image     Comment:   External Document

## 2011-01-03 NOTE — Progress Notes (Signed)
Summary: swelling around pacermaker site and color is black and blue  Phone Note Call from Patient Call back at Home Phone [phone]   Caller: Spouse/ Marie Summary of Call: PT wife calling regarding swelling  at pacemaker site and chest is black and blue. Initial call taken by: Judie Grieve,  November 05, 2010 11:11 AM  Follow-up for Phone Call        Line busy. Will call back. Mylo Red RN  BUSY  X1 PER PAULA HAVE PT COME IN TODAY FOR WOUND CHECK WILL TRY AGAIN LATER Scherrie Bateman, LPN  November 05, 2010 1:06 PM CONTINUE TO RECIEVE BUSY SIGNAL LM FOR DAUGHTER TO CALL TO SEE IF SHE CAN REACH PT .CELL NO. [phone] Scherrie Bateman, LPN  November 05, 2010 1:36 PM  Additional Follow-up for Phone Call Additional follow up Details #1::        DAUGHTER  RETURNED CALL INFORMED NEED TO BRING DAD INTO OFFICE  FOR WOUND CHECK . PER DAUGHTER  WILL PICK UP DAD AND BRING IN . Additional Follow-up by: Scherrie Bateman, LPN,  November 05, 2010 1:48 PM

## 2011-01-03 NOTE — Cardiovascular Report (Signed)
Summary: Office Visit   Office Visit   Imported By: Roderic Ovens 12/24/2010 10:46:54  _____________________________________________________________________  External Attachment:    Type:   Image     Comment:   External Document

## 2011-01-03 NOTE — Procedures (Signed)
Summary: device check/@ 2:45 per kelly/sl   Allergies: 1)  ! Pcn  PPM Specifications Following MD:  Lewayne Bunting, MD     PPM Vendor:  [employer]     PPM Model Number:  [id number]     PPM Serial Number:  [id number] PPM DOI:  10/31/2010     PPM Implanting MD:  Lewayne Bunting, MD  Lead 1    Location: RA     DOI: 08/14/2005     Model #: 1601     Serial #: [id number]     Status: active Lead 2    Location: RV     DOI: 08/14/2005     Model #: 2202     Serial #: [id number]     Status: active Lead 3    Location: LV     DOI: 09/05/2006     Model #: 4194     Serial #: [id number]     Status: active  Magnet Response Rate:  BOL 85 ERI 65  Indications:  CM   PPM Follow Up Pacer Dependent:  Yes      Parameters Mode:  vvi     Lower Rate Limit:  75     Upper Rate Limit:  120  ICD Specifications Following MD:  Lewayne Bunting, MD     Referring MD:  BENSIMHON ICD Vendor:  [employer]     ICD Model Number:  [id number]     ICD Serial Number:  [id number] ICD DOI:  09/05/2006     ICD Implanting MD:  Lewayne Bunting, MD  Lead 1:    Location: RA     DOI: 08/14/2005     Model #: 2703     Serial #: [id number]     Status: active Lead 2:    Location: RV     DOI: 09/05/2006     Model #: 9371     Serial #: [id number]     Status: active Lead 3:    Location: LV     DOI: 09/05/2006     Model #: 4194     Serial #: [id number]     Status: active Lead 4:    Location: RA     DOI: 08/14/2005     Model #: 5277     Serial #: [id number]     Status: capped  Indications::  ICM, CHF  Explantation Comments: 10/31/2010 [employer] C154DWK/PVR  ICD Follow Up ICD Dependent:  Yes      Episodes Coumadin:  Yes  Brady Parameters Mode DDDR     Lower Rate Limit:  75     Upper Rate Limit 130 PAV 130     Sensed AV Delay:  100  Tachy Zones VF:  200     VT:  250 FVT VIA VF     VT1:  200

## 2011-01-03 NOTE — Medication Information (Signed)
Summary: rov/tm  Anticoagulant Therapy  Managed by: Samantha Crimes, PharmD PCP: Newt Lukes MD Supervising MD: Daleen Squibb MD, Maisie Fus Indication 1: Atrial Fibrillation (ICD-427.31) Lab Used: [facility] Site: [facility] INR POC 1.8 INR RANGE 2 - 3  Dietary changes: no    Health status changes: no    Bleeding/hemorrhagic complications: no    Recent/future hospitalizations: no    Any changes in medication regimen? no    Recent/future dental: no  Any missed doses?: no       Is patient compliant with meds? yes       Current Medications (verified): 1)  Lasix 40 Mg Tabs (Furosemide) .Marland Kitchen.. 1 Once Daily 2)  Zyrtec Allergy 10 Mg Tabs (Cetirizine Hcl) .Marland Kitchen.. 1 Qd 3)  Ocuvite  Tabs (Multiple Vitamins-Minerals) .Marland Kitchen.. 1 Once Daily 4)  Potassium Chloride 20 Meq  Pack (Potassium Chloride) .Marland Kitchen.. 1 Once Daily 5)  Lisinopril 10 Mg  Tabs (Lisinopril) .... Take 1/2  Tab By Mouth Daily 6)  Omeprazole 20 Mg Tbec (Omeprazole) .Marland Kitchen.. 1 Once Daily 7)  Finasteride 5 Mg Tabs (Finasteride) .Marland Kitchen.. 1 Once Daily 8)  Melatonin 3 Mg Caps (Melatonin) .... At Bedtime 9)  Pravastatin Sodium 40 Mg Tabs (Pravastatin Sodium) .... Take One Tablet By Mouth Daily At Bedtime 10)  Vitamin D (Ergocalciferol) 50000 Unit Caps (Ergocalciferol) .... Take 1 Q Friday 11)  Clonazepam 0.5 Mg Tabs (Clonazepam) .... 1/2-1 Tab By Mouth  Two Times A Day As Needed 12)  Mirtazapine 15 Mg Tabs (Mirtazapine) .... 1/2 Tab At Bedtime As Needed 13)  Astelin 137 Mcg/spray Soln (Azelastine Hcl) .Marland Kitchen.. 1 Spray Each Nostril  Every Morning 14)  Fluticasone Propionate 50 Mcg/act Susp (Fluticasone Propionate) .Marland Kitchen.. 1 Spray Each Nostril  Every Morning 15)  Systane 0.4-0.3 % Soln (Polyethyl Glycol-Propyl Glycol) .... Left Eye At Bedtime 16)  Doxazosin Mesylate 4 Mg Tabs (Doxazosin Mesylate) .Marland Kitchen.. 1 At Bedtime 17)  Flomax 0.4 Mg Caps (Tamsulosin Hcl) .... Take 2 By Mouth Once Daily 18)  Zoloft 25 Mg Tabs (Sertraline Hcl) .... Take Half Tablet Daily For  4 Days. Then Take 1 Tablet For 14 Days. Then Take 2 Tablets By Mouth Daily  Allergies (verified): 1)  ! Pcn  Anticoagulation Management History:      Positive risk factors for bleeding include an age of 5 years or older.  The bleeding index is 'intermediate risk'.  Positive CHADS2 values include History of CHF, History of HTN, and Age > 20 years old.  The start date was 01/30/2006.  His last INR was 2.2 ratio and today's INR is 1.9.  Anticoagulation responsible provider: Daleen Squibb MD, Maisie Fus.  INR POC: 1.8.  Exp: 01/2012.    Anticoagulation Management Assessment/Plan:      The target INR is 2.0-3.0.  The next INR is due 01/14/2011.  Anticoagulation instructions were given to patient/wife.  Results were reviewed/authorized by Samantha Crimes, PharmD.         Prior Anticoagulation Instructions: INR 1.0  Coumadin 5 mg tablets - Take 1 tablet on Mondays, Wednesdays and Fridays and 1/2 tablet on Sunday, Tuesday, Thursday and Saturday   Current Anticoagulation Instructions: Cont current regimen Return to clinic on February 15th at 2:30

## 2011-01-03 NOTE — Medication Information (Signed)
Summary: f/u coumadin started 5mg  on 12-13-10  Anticoagulant Therapy  Managed by: Bethena Midget, RN, BSN PCP: Newt Lukes MD Supervising MD: Daleen Squibb MD, Maisie Fus Indication 1: Atrial Fibrillation (ICD-427.31) Lab Used: [facility] Site: [facility] INR POC 1.0 INR RANGE 2 - 3  Dietary changes: no    Health status changes: no    Bleeding/hemorrhagic complications: no    Recent/future hospitalizations: no    Any changes in medication regimen? yes       Details: Restarted Coumadin 12/13/10 2.5 mg per day   Recent/future dental: no  Any missed doses?: no       Is patient compliant with meds? yes      Comments: Experienced large hematoma after device placement and discontinued Coumadin for 6-7 weeks. Resumed 12/13/10 at 2.5 mg per day. Pt says he was taking 5 mg twice weekly and 2.5 mg on remaining days prior to procedure.   Allergies: 1)  ! Pcn  Anticoagulation Management History:      The patient is taking warfarin and comes in today for a routine follow up visit.  Positive risk factors for bleeding include an age of 75 years or older.  The bleeding index is 'intermediate risk'.  Positive CHADS2 values include History of CHF, History of HTN, and Age > 35 years old.  The start date was 01/30/2006.  His last INR was 2.2 ratio.  Anticoagulation responsible provider: Daleen Squibb MD, Maisie Fus.  INR POC: 1.0.  Cuvette Lot#: [id number].  Exp: 01/2012.    Anticoagulation Management Assessment/Plan:      The target INR is 2.0-3.0.  The next INR is due 12/24/2010.  Anticoagulation instructions were given to patient/wife.  Results were reviewed/authorized by Bethena Midget, RN, BSN.  He was notified by Stephannie Peters, PharmD Candidate .         Prior Anticoagulation Instructions: INR 1.9 Take 1.5 tablets today then resume previous dose of 1 tablet everyday except 0.5 tablet on Monday and Thursday Recheck INR in   Current Anticoagulation Instructions: INR 1.0  Coumadin 5 mg tablets - Take 1 tablet  on Mondays, Wednesdays and Fridays and 1/2 tablet on Sunday, Tuesday, Thursday and Saturday

## 2011-01-03 NOTE — Progress Notes (Signed)
Summary: question about procedure  Phone Note From Other Clinic Call back at [phone]   Caller: [employer]/ [place] Summary of Call: Has question about pt procedure on 10/31/10 Initial call taken by: Judie Grieve,  December 04, 2010 1:09 PM  Follow-up for Phone Call        Left message for [email or web address]@ [email or web address] Follow-up by: Altha Harm, LPN,  December 05, 2010 8:52 AM  Additional Follow-up for Phone Call Additional follow up Details #1::        spoke w/Rochelle at [facility].  ?s about change out on 10-31-10. Vella Kohler  December 06, 2010 8:36 AM

## 2011-01-03 NOTE — Assessment & Plan Note (Signed)
Summary: device/saf   Primary Provider:  Newt Lukes MD   History of Present Illness: [name] returns today for PPM incision followup.  He is a pleasant 77 yo man with a DCM who underwent BiV ICD removal (ERI) and insertion of a BiV PPM nearly 3 weeks ago.  He was kept on coumadin but developed a large hematoma and is back today for followup.  He has not had any active bleeding but does have extensive ecchymosis.  At the time of his procedure there was extensive debridement required of his extensive scar tissue.  He is also on low dose ASA. He denies fever/chills/ or other signs of infection. No drainage.  Current Medications (verified): 1)  Lasix 40 Mg Tabs (Furosemide) .Marland Kitchen.. 1 Once Daily 2)  Zyrtec Allergy 10 Mg Tabs (Cetirizine Hcl) .Marland Kitchen.. 1 Qd 3)  Ocuvite  Tabs (Multiple Vitamins-Minerals) .Marland Kitchen.. 1 Once Daily 4)  Potassium Chloride 20 Meq  Pack (Potassium Chloride) .Marland Kitchen.. 1 Once Daily 5)  Lisinopril 10 Mg  Tabs (Lisinopril) .... Take 1/2  Tab By Mouth Daily 6)  Omeprazole 20 Mg Tbec (Omeprazole) .Marland Kitchen.. 1 Once Daily 7)  Finasteride 5 Mg Tabs (Finasteride) .Marland Kitchen.. 1 Once Daily 8)  Melatonin 3 Mg Caps (Melatonin) .... At Bedtime 9)  Pravastatin Sodium 40 Mg Tabs (Pravastatin Sodium) .... Take One Tablet By Mouth Daily At Bedtime 10)  Vitamin D (Ergocalciferol) 50000 Unit Caps (Ergocalciferol) .... Take 1 Q Friday 11)  Clonazepam 0.5 Mg Tabs (Clonazepam) .... 1/2-1 Tab By Mouth  Two Times A Day As Needed 12)  Mirtazapine 15 Mg Tabs (Mirtazapine) .... 1/2 Tab At Bedtime As Needed 13)  Astelin 137 Mcg/spray Soln (Azelastine Hcl) .Marland Kitchen.. 1 Spray Each Nostril  Every Morning 14)  Fluticasone Propionate 50 Mcg/act Susp (Fluticasone Propionate) .Marland Kitchen.. 1 Spray Each Nostril  Every Morning 15)  Systane 0.4-0.3 % Soln (Polyethyl Glycol-Propyl Glycol) .... Left Eye At Bedtime 16)  Doxazosin Mesylate 4 Mg Tabs (Doxazosin Mesylate) .Marland Kitchen.. 1 At Bedtime 17)  Flomax 0.4 Mg Caps (Tamsulosin Hcl) .... Take 2 By Mouth  Once Daily 18)  Zoloft 25 Mg Tabs (Sertraline Hcl) .... Take Half Tablet Daily For 4 Days. Then Take 1 Tablet For 14 Days. Then Take 2 Tablets By Mouth Daily  Allergies (verified): 1)  ! Pcn  Past History:  Past Medical History: Last updated: 09/17/2010  1. CHF due to ischemic CM          a. Echo 8/08 EF: 20-30% moderate to severe MR, mild AI  2. Atrial fib -anticoag         a. Amiodarone stopped due to recurrent AF 1/11  3.  CAD         a. s/p cath 9/06 chronic total occlusion LAD with right-to-left and left-to-left collaterals,               minimal nonobstructive CAD, in LCX and RCA. EF was 45% at that time.  4. Moderate to severe, MR  5. History of anemia secondary to GI bleed.  6. History of symptomatic bradycardia         a. s/p BiV ICD  7. Hypertension.  8. Hyperlipidemia  9. Retinal bleeding after cataract removal 10. Diverticulitis. 11. Osteoporosis with severe kyphosis 12. BPH 13. Hiatal hernia. 14. Glaucoma. 15. Post-op urinary retention  MD roster:   cards - benshimon  Vital Signs:  Patient profile:   77 year old male Height:      68 inches Weight:  166 pounds BMI:     25.33 Pulse rate:   76 / minute BP sitting:   110 / 70  (left arm)  Vitals Entered By: Laurance Flatten CMA (November 21, 2010 3:21 PM)  Physical Exam  General:   frail kyphotic. no resp difficulty HEENT: normal Neck: supple. JVP 6 Carotids 2+ bilat; no bruits. No lymphadenopathy or thryomegaly appreciated. Cor: PMI nondisplaced. Regular rate & rhythm. No rubs, gallops,3/6 SEM at apex Lungs: clear. Large area of ecchymosis and hematoma is present. Abdomen: soft, nontender, nondistended. No hepatosplenomegaly. No bruits or masses. Good bowel sounds. Extremities: no cyanosis, clubbing, rash, edema Neuro: alert & orientedx3, cranial nerves grossly intact. moves all 4 extremities w/o difficulty. affect pleasant. no drift.    PPM Specifications Following MD:  Lewayne Bunting, MD     PPM  Vendor:  [employer]onic     PPM Model Number:  [id number]     PPM Serial Number:  [id number] PPM DOI:  10/31/2010     PPM Implanting MD:  Lewayne Bunting, MD  Lead 1    Location: RA     DOI: 08/14/2005     Model #: [id number]     Serial #: [id number]     Status: active Lead 2    Location: RV     DOI: 08/14/2005     Model #: [id number]     Serial #: [id number]     Status: active Lead 3    Location: LV     DOI: 09/05/2006     Model #: [id number]     Serial #: [id number]     Status: active  Magnet Response Rate:  BOL 85 ERI 65  Indications:  CM   PPM Follow Up Pacer Dependent:  Yes      Parameters Mode:  vvi     Lower Rate Limit:  75     Upper Rate Limit:  120  ICD Specifications Following MD:  Lewayne Bunting, MD     Referring MD:  BENSIMHON ICD Vendor:  [employer]tronic     ICD Model Number:  [id number]     ICD Serial Number:  [id number] ICD DOI:  09/05/2006     ICD Implanting MD:  Lewayne Bunting, MD  Lead 1:    Location: RA     DOI: 08/14/2005     Model #: [id number]     Serial #: [id number]     Status: active Lead 2:    Location: RV     DOI: 09/05/2006     Model #: 1884     Serial #: [id number]     Status: active Lead 3:    Location: LV     DOI: 09/05/2006     Model #: [id number]     Serial #: [id number]     Status: active Lead 4:    Location: RA     DOI: 08/14/2005     Model #: [id number]     Serial #: [id number]     Status: capped  Indications::  ICM, CHF  Explantation Comments: 10/31/2010 Medtronic [id number]  ICD Follow Up ICD Dependent:  Yes      Episodes Coumadin:  Yes  Brady Parameters Mode DDDR     Lower Rate Limit:  75     Upper Rate Limit 130 PAV 130     Sensed AV Delay:  100  Tachy Zones VF:  200     VT:  250 FVT VIA VF     VT1:  200  Impression & Recommendations:  Problem # 1:  CARDIAC PACEMAKER IN SITU (ICD-V45.01) His hematoma is minimally improved.  I have recommended that he continue to hold ASA and coumadin and undergo watchful waiting.  If he develops pocket bleeding through his device then he will need  evacuation and may ultimately require extraction if he ends up having an infection. Hopefully this will not be the case.  Problem # 2:  SYSTOLIC HEART FAILURE, CHRONIC (ICD-428.22) His symptoms remain class 2-3 and are improved from the first few days after implant. He will continue to advance his activity and maintain a low sodium diet. The following medications were removed from the medication list:    Aspirin 81 Mg Tbec (Aspirin) ..... One by mouth every day    Coumadin 5 Mg Tabs (Warfarin sodium) .Marland Kitchen... Take as directed by coumadin clinic. His updated medication list for this problem includes:    Lasix 40 Mg Tabs (Furosemide) .Marland Kitchen... 1 once daily    Lisinopril 10 Mg Tabs (Lisinopril) .Marland Kitchen... Take 1/2  tab by mouth daily  Patient Instructions: 1)  Your physician recommends that you schedule a follow-up appointment in: 2 weeks in the device clinic

## 2011-01-04 NOTE — Cardiovascular Report (Signed)
Summary: Office Visit   Office Visit   Imported By: Roderic Ovens 04/17/2010 11:29:38  _____________________________________________________________________  External Attachment:    Type:   Image     Comment:   External Document

## 2011-01-04 NOTE — Cardiovascular Report (Signed)
Summary: Pre-Op Orders  Pre-Op Orders   Imported By: Marylou Mccoy 11/07/2010 12:25:27  _____________________________________________________________________  External Attachment:    Type:   Image     Comment:   External Document

## 2011-01-09 ENCOUNTER — Ambulatory Visit (INDEPENDENT_AMBULATORY_CARE_PROVIDER_SITE_OTHER): Payer: Medicare Other | Admitting: Internal Medicine

## 2011-01-09 ENCOUNTER — Other Ambulatory Visit: Payer: Self-pay | Admitting: Internal Medicine

## 2011-01-09 ENCOUNTER — Encounter: Payer: Self-pay | Admitting: Internal Medicine

## 2011-01-09 ENCOUNTER — Other Ambulatory Visit: Payer: Medicare Other

## 2011-01-09 DIAGNOSIS — I4891 Unspecified atrial fibrillation: Secondary | ICD-10-CM

## 2011-01-09 DIAGNOSIS — F329 Major depressive disorder, single episode, unspecified: Secondary | ICD-10-CM | POA: Insufficient documentation

## 2011-01-09 DIAGNOSIS — F3289 Other specified depressive episodes: Secondary | ICD-10-CM | POA: Insufficient documentation

## 2011-01-09 DIAGNOSIS — I10 Essential (primary) hypertension: Secondary | ICD-10-CM

## 2011-01-09 DIAGNOSIS — E785 Hyperlipidemia, unspecified: Secondary | ICD-10-CM

## 2011-01-09 DIAGNOSIS — Z79899 Other long term (current) drug therapy: Secondary | ICD-10-CM

## 2011-01-09 LAB — HEPATIC FUNCTION PANEL
Albumin: 4.1 g/dL (ref 3.5–5.2)
Total Protein: 6.8 g/dL (ref 6.0–8.3)

## 2011-01-09 LAB — LIPID PANEL
Cholesterol: 154 mg/dL (ref 0–200)
LDL Cholesterol: 73 mg/dL (ref 0–99)
Triglycerides: 100 mg/dL (ref 0.0–149.0)
VLDL: 20 mg/dL (ref 0.0–40.0)

## 2011-01-09 NOTE — Progress Notes (Signed)
Summary: Miratazapine directions  Phone Note From Pharmacy   Caller: [facility] Request: Resend Prescription Summary of Call: Recieved fax stating pt say sometimes he takes more than 1/2 mirtazapine 15mg . Requesting new rx with correct directions. [facility] pt is overdue for yearly physical and must see md before making new changes on med. Initial call taken by: Orlan Leavens RMA,  January 01, 2011 10:13 AM

## 2011-01-16 ENCOUNTER — Encounter (INDEPENDENT_AMBULATORY_CARE_PROVIDER_SITE_OTHER): Payer: Medicare Other

## 2011-01-16 ENCOUNTER — Encounter: Payer: Self-pay | Admitting: Internal Medicine

## 2011-01-16 ENCOUNTER — Ambulatory Visit (INDEPENDENT_AMBULATORY_CARE_PROVIDER_SITE_OTHER): Payer: Medicare Other | Admitting: Internal Medicine

## 2011-01-16 DIAGNOSIS — R319 Hematuria, unspecified: Secondary | ICD-10-CM

## 2011-01-16 DIAGNOSIS — I5023 Acute on chronic systolic (congestive) heart failure: Secondary | ICD-10-CM

## 2011-01-16 DIAGNOSIS — I2510 Atherosclerotic heart disease of native coronary artery without angina pectoris: Secondary | ICD-10-CM

## 2011-01-16 DIAGNOSIS — I4891 Unspecified atrial fibrillation: Secondary | ICD-10-CM

## 2011-01-16 DIAGNOSIS — Z7901 Long term (current) use of anticoagulants: Secondary | ICD-10-CM

## 2011-01-17 NOTE — Assessment & Plan Note (Signed)
Summary: FU [name]  #   Vital Signs:  Patient profile:   77 year old male Height:      68 inches (172.72 cm) Weight:      161.0 pounds (73.18 kg) Temp:     97.4 degrees F (36.33 degrees C) oral Pulse rate:   72 / minute BP sitting:   120 / 70  (left arm) Cuff size:   regular  Vitals Entered By: Orlan Leavens RMA (January 09, 2011 1:40 PM) CC: follow-up visit Is Patient Diabetic? No Pain Assessment Patient in pain? no        Primary Care Provider:  Newt Lukes MD  CC:  follow-up visit.  History of Present Illness: here for f/u  reviewed chronic med issues: 1) dyslipidemia - reports compliance with ongoing medical treatment and no changes in medication dose or frequency. denies adverse side effects related to current therapy. no G or muscle c/o  2) HTN - reports compliance with ongoing medical treatment and no changes in medication dose or frequency. denies adverse side effects related to current therapy. no HA or vision changes, no weakness  3) chronic CHF - reports compliance with ongoing medical treatment and no changes in medication dose or frequency. denies adverse side effects related to current therapy. weight stable - no swelling of legs or SOB  4) PAF -  changes in amio reviewed (now off amio, rate control only) -  no bleeding with anticoag; no changein bruising -   5) depression - improved energy and fatigue since starting zoloft 10/2010 - also sleeping better with remeron -  Clinical Review Panels:  Lipid Management   Cholesterol:  181 (11/08/2009)   LDL (bad choesterol):  89 (11/08/2009)   HDL (good cholesterol):  78.60 (11/08/2009)   Triglycerides:  109 (06/01/2008)  CBC   WBC:  6.0 (10/23/2010)   RBC:  3.71 (10/23/2010)   Hgb:  12.9 (10/23/2010)   Hct:  37.4 (10/23/2010)   Platelets:  183.0 (10/23/2010)   MCV  100.8 (10/23/2010)   MCHC  34.6 (10/23/2010)   RDW  13.2 (10/23/2010)   PMN:  66.5 (10/23/2010)   Lymphs:  22.9 (10/23/2010)  Monos:  9.2 (10/23/2010)   Eosinophils:  0.8 (10/23/2010)   Basophil:  0.6 (10/23/2010)  Complete Metabolic Panel   Glucose:  123 (10/23/2010)   Sodium:  139 (10/23/2010)   Potassium:  4.5 (10/23/2010)   Chloride:  100 (10/23/2010)   CO2:  31 (10/23/2010)   BUN:  20 (10/23/2010)   Creatinine:  1.1 (10/23/2010)   Albumin:  4.4 (11/08/2009)   Total Protein:  7.2 (11/08/2009)   Calcium:  9.6 (10/23/2010)   Total Bili:  1.0 (11/08/2009)   Alk Phos:  43 (11/08/2009)   SGPT (ALT):  20 (11/08/2009)   SGOT (AST):  28 (11/08/2009)   Current Medications (verified): 1)  Lasix 40 Mg Tabs (Furosemide) .Marland Kitchen.. 1 Once Daily 2)  Zyrtec Allergy 10 Mg Tabs (Cetirizine Hcl) .Marland Kitchen.. 1 Qd 3)  Ocuvite  Tabs (Multiple Vitamins-Minerals) .Marland Kitchen.. 1 Once Daily 4)  Potassium Chloride 20 Meq  Pack (Potassium Chloride) .Marland Kitchen.. 1 Once Daily 5)  Lisinopril 10 Mg  Tabs (Lisinopril) .... Take 1/2  Tab By Mouth Daily 6)  Omeprazole 20 Mg Tbec (Omeprazole) .Marland Kitchen.. 1 Once Daily 7)  Finasteride 5 Mg Tabs (Finasteride) .Marland Kitchen.. 1 Once Daily 8)  Melatonin 3 Mg Caps (Melatonin) .... At Bedtime 9)  Pravastatin Sodium 40 Mg Tabs (Pravastatin Sodium) .... Take One Tablet By Mouth Daily At  Bedtime 10)  Vitamin D (Ergocalciferol) 50000 Unit Caps (Ergocalciferol) .... Take 1 Q Friday 11)  Clonazepam 0.5 Mg Tabs (Clonazepam) .... 1/2-1 Tab By Mouth  Two Times A Day As Needed 12)  Mirtazapine 15 Mg Tabs (Mirtazapine) .... 1/2 Tab At Bedtime As Needed 13)  Astelin 137 Mcg/spray Soln (Azelastine Hcl) .Marland Kitchen.. 1 Spray Each Nostril  Every Morning 14)  Fluticasone Propionate 50 Mcg/act Susp (Fluticasone Propionate) .Marland Kitchen.. 1 Spray Each Nostril  Every Morning 15)  Systane 0.4-0.3 % Soln (Polyethyl Glycol-Propyl Glycol) .... Left Eye At Bedtime 16)  Doxazosin Mesylate 4 Mg Tabs (Doxazosin Mesylate) .Marland Kitchen.. 1 At Bedtime 17)  Flomax 0.4 Mg Caps (Tamsulosin Hcl) .... Take 2 By Mouth Once Daily 18)  Zoloft 25 Mg Tabs (Sertraline Hcl) .... Take Half Tablet Daily  For 4 Days. Then Take 1 Tablet For 14 Days. Then Take 2 Tablets By Mouth Daily  Allergies (verified): 1)  ! Pcn  Past History:  Past Medical History:  1. CHF due to ischemic CM          a. Echo 8/08 EF: 20-30% moderate to severe MR, mild AI  2. Atrial fib -anticoag         a. Amiodarone stopped due to recurrent AF 1/11  3.  CAD         a. s/p cath 9/06 chronic total occlusion LAD with right-to-left and left-to-left collaterals,               minimal nonobstructive CAD, in LCX and RCA. EF was 45% at that time.  4. Moderate to severe, MR  5. History of anemia secondary to GI bleed.  6. History of symptomatic bradycardia         a. s/p BiV ICD  7. Hypertension  8. Hyperlipidemia  9. Retinal bleeding after cataract removal 10. Diverticulitis. 11. Osteoporosis with severe kyphosis 12. BPH 13. Hiatal hernia. 14. Glaucoma. 15. Post-op urinary retention  MD roster:   cards - benshimon  Past Surgical History: 10/21/2010 - generator change on BiV PM 02/05/2008 -Cardioversion 09/05/2006-Removal of a previously implanted dual-chamber pacemaker generator, and insertion of a new biventricular ICD. 03/17/2006 -Direct current cardioversion. Inguinal herniorrhaphy Surgery on his hand Eye surgery 2009 & 2010  Review of Systems  The patient denies anorexia, weight gain, chest pain, headaches, and abdominal pain.    Physical Exam  General:  frail kyphotic - alert, well-developed, well-nourished, and cooperative to examination.   wife and dtr at side Lungs:  normal respiratory effort, no intercostal retractions or use of accessory muscles; good breath sounds bilaterally; no crackles and no wheeze.   Heart:  normal rate, regular rhythm, no murmur, and no rub. BLE with min edema, R 1+ and L trace (chronic R>L).  Psych:  Oriented X3, memory intact for recent and remote, normally interactive, good eye contact, not anxious appearing, not depressed appearing, and not agitated.       Impression & Recommendations:  Problem # 1:  HYPERLIPIDEMIA-MIXED (ICD-272.4)  His updated medication list for this problem includes:    Pravastatin Sodium 40 Mg Tabs (Pravastatin sodium) .Marland Kitchen... Take one tablet by mouth daily at bedtime  Orders: TLB-Lipid Panel (80061-LIPID)  Labs Reviewed: SGOT: 28 (11/08/2009)   SGPT: 20 (11/08/2009)   HDL:78.60 (11/08/2009), 47 (06/01/2008)  LDL:89 (11/08/2009), 165 (91/47/8295)  Chol:181 (11/08/2009), 234 (06/01/2008)  Trig:65.0 (11/08/2009), 109 (06/01/2008)  Problem # 2:  HYPERTENSION, BENIGN (ICD-401.1)  His updated medication list for this problem includes:    Lasix 40 Mg  Tabs (Furosemide) .Marland Kitchen... 1 once daily    Lisinopril 10 Mg Tabs (Lisinopril) .Marland Kitchen... Take 1/2  tab by mouth daily    Doxazosin Mesylate 4 Mg Tabs (Doxazosin mesylate) .Marland Kitchen... 1 at bedtime  BP today: 120/70 Prior BP: 110/70 (11/21/2010)  Labs Reviewed: K+: 4.5 (10/23/2010) Creat: : 1.1 (10/23/2010)   Chol: 181 (11/08/2009)   HDL: 78.60 (11/08/2009)   LDL: 89 (11/08/2009)   TG: 65.0 (11/08/2009)  Problem # 3:  ATRIAL FIBRILLATION (ICD-427.31) per cards Reviewed the following: PT: 23.5 (10/23/2010)   INR: 1.9 (12/24/2010) Coumadin Dose (weekly): 25 mg (12/24/2010) Prior Coumadin Dose (weekly): 25 mg (12/24/2010) Next Protime: 01/14/2011 (dated on 12/24/2010)  Problem # 4:  SYSTOLIC HEART FAILURE, CHRONIC (ICD-428.22)  His updated medication list for this problem includes:    Lasix 40 Mg Tabs (Furosemide) .Marland Kitchen... 1 once daily    Lisinopril 10 Mg Tabs (Lisinopril) .Marland Kitchen... Take 1/2  tab by mouth daily  symptoms seem improved since implant 10/2010. He will continue to advance his activity and maintain a low sodium diet. follow with cards as ongoing  Echocardiogram:  - Left ventricle: The cavity size was normal. Wall thickness was     normal.   - Aortic valve: Trivial regurgitation.   - Mitral valve: Moderate to severe regurgitation.   - Left atrium: The atrium was  moderately dilated.   - Right atrium: The atrium was mildly to moderately dilated. (04/16/2010)  Problem # 5:  DEPRESSION (ICD-311)  started sertraline 10/2010- doing well - cont same His updated medication list for this problem includes:    Clonazepam 0.5 Mg Tabs (Clonazepam) .Marland Kitchen... 1/2-1 tab by mouth  two times a day as needed    Mirtazapine 15 Mg Tabs (Mirtazapine) .Marland Kitchen... 1/2-1 tab at bedtime as needed    Zoloft 50 Mg Tabs (Sertraline hcl) .Marland Kitchen... 1 by mouth once daily  Orders: Prescription Created Electronically [id number])  Complete Medication List: 1)  Lasix 40 Mg Tabs (Furosemide) .Marland Kitchen.. 1 once daily 2)  Zyrtec Allergy 10 Mg Tabs (Cetirizine hcl) .Marland Kitchen.. 1 qd 3)  Ocuvite Tabs (Multiple vitamins-minerals) .Marland Kitchen.. 1 once daily 4)  Potassium Chloride 20 Meq Pack (Potassium chloride) .Marland Kitchen.. 1 once daily 5)  Lisinopril 10 Mg Tabs (Lisinopril) .... Take 1/2  tab by mouth daily 6)  Omeprazole 20 Mg Tbec (Omeprazole) .Marland Kitchen.. 1 once daily 7)  Finasteride 5 Mg Tabs (Finasteride) .Marland Kitchen.. 1 once daily 8)  Melatonin 3 Mg Caps (Melatonin) .... At bedtime 9)  Pravastatin Sodium 40 Mg Tabs (Pravastatin sodium) .... Take one tablet by mouth daily at bedtime 10)  Vitamin D (ergocalciferol) 50000 Unit Caps (Ergocalciferol) .... Take 1 q friday 11)  Clonazepam 0.5 Mg Tabs (Clonazepam) .... 1/2-1 tab by mouth  two times a day as needed 12)  Mirtazapine 15 Mg Tabs (Mirtazapine) .... 1/2-1 tab at bedtime as needed 13)  Astelin 137 Mcg/spray Soln (Azelastine hcl) .Marland Kitchen.. 1 spray each nostril  every morning 14)  Fluticasone Propionate 50 Mcg/act Susp (Fluticasone propionate) .Marland Kitchen.. 1 spray each nostril  every morning 15)  Systane 0.4-0.3 % Soln (Polyethyl glycol-propyl glycol) .... Left eye at bedtime 16)  Doxazosin Mesylate 4 Mg Tabs (Doxazosin mesylate) .Marland Kitchen.. 1 at bedtime 17)  Flomax 0.4 Mg Caps (Tamsulosin hcl) .... Take 2 by mouth once daily 18)  Zoloft 50 Mg Tabs (Sertraline hcl) .Marland Kitchen.. 1 by mouth once daily  Other  Orders: TLB-Hepatic/Liver Function Pnl (80076-HEPATIC)  Patient Instructions: 1)  it was good to see you today. 2)  medications reviewed  and refilled -  3)  changed zoloft to 1 pill instead of 2 but same dose (50mg ), no other changes - 4)  test(s) ordered today - your results will be called to you after review in 48-72 hours from the time of test completion; if any changes need to be made or there are abnormal results, you will be notified at that time 5)  Please schedule a follow-up appointment in 6 months, call sooner if problems.  Prescriptions: DOXAZOSIN MESYLATE 4 MG TABS (DOXAZOSIN MESYLATE) 1 at bedtime  #30 Tablet x 11   Entered and Authorized by:   Newt Lukes MD   Signed by:   Newt Lukes MD on 01/09/2011   Method used:   Electronically to        [facility] (retail)       [address]       [place], [place]  [address]       Ph: [phone] or [phone]       Fax: [phone]   RxID:   [id number] FLOMAX 0.4 MG CAPS (TAMSULOSIN HCL) take 2 by mouth once daily  #60 Capsule x 11   Entered and Authorized by:   Newt Lukes MD   Signed by:   Newt Lukes MD on 01/09/2011   Method used:   Electronically to        [facility] (retail)       [address]       [place], [place]  [address]       Ph: [phone] or [phone]       Fax: [phone]   RxID:   [id number] PRAVASTATIN SODIUM 40 MG TABS (PRAVASTATIN SODIUM) Take one tablet by mouth daily at bedtime  #30 Tablet x 11   Entered and Authorized by:   Newt Lukes MD   Signed by:   Newt Lukes MD on 01/09/2011   Method used:   Electronically to        [facility] (retail)       [address]       [place], [place]  [address]       Ph: [phone] or [phone]       Fax: [phone]   RxID:   [id number] FINASTERIDE 5 MG TABS (FINASTERIDE) 1 once daily  #30 Tablet x 11   Entered and Authorized by:   Newt Lukes MD   Signed by:   Newt Lukes MD on 01/09/2011   Method used:   Electronically to        [facility] (retail)       [address]       [place], [place]  [address]       Ph: [phone] or [phone]       Fax: [phone]   RxID:   [id number] OMEPRAZOLE 20 MG TBEC (OMEPRAZOLE) 1 once daily  #30 Capsule x 11   Entered and Authorized by:   Newt Lukes MD   Signed by:   Newt Lukes MD on 01/09/2011   Method used:   Electronically to        [facility] (retail)       [address]       [place], [place]  [address]       Ph: [phone] or [phone]       Fax: [phone]   RxID:   [id number] POTASSIUM CHLORIDE 20 MEQ  PACK (POTASSIUM CHLORIDE) 1 once daily  #30 Tablet x 11   Entered and Authorized by:   Newt Lukes MD   Signed by:   Newt Lukes MD on 01/09/2011   Method used:   Electronically to        [facility] (retail)       [address]       [place], [place]  [address]       Ph: [phone] or [phone]       Fax: [phone]   RxID:   [id number] LASIX 40 MG TABS (FUROSEMIDE) 1 once daily  #30 Tablet x 11   Entered and Authorized by:   Newt Lukes MD   Signed by:   Newt Lukes MD on 01/09/2011   Method used:   Electronically to        [facility] (retail)       [address]       [place], [place]  [address]       Ph: [phone] or [phone]       Fax: [phone]   RxID:   [id number] ZOLOFT 50 MG TABS (SERTRALINE HCL) 1 by mouth once daily  #30 x 6   Entered and Authorized by:   Newt Lukes MD   Signed by:   Newt Lukes MD on 01/09/2011   Method used:   Electronically to        [facility] (retail)       [address]       [place], [place]  [address]       Ph: [phone] or [phone]       Fax: [phone]   RxID:   [id number] MIRTAZAPINE 15 MG TABS (MIRTAZAPINE) 1/2-1 tab at bedtime as needed  #30 x 6   Entered and Authorized by:   Newt Lukes MD   Signed by:    Newt Lukes MD on 01/09/2011   Method used:   Electronically to        [facility] (retail)       [address]       [place], [place]  [address]       Ph: [phone] or [phone]       Fax: [phone]   RxID:   [id number]    Orders Added: 1)  TLB-Lipid Panel [80061-LIPID] 2)  TLB-Hepatic/Liver Function Pnl [80076-HEPATIC] 3)  Est. Patient Level IV [[address]] 4)  Prescription Created Electronically [phone]

## 2011-01-23 ENCOUNTER — Encounter (INDEPENDENT_AMBULATORY_CARE_PROVIDER_SITE_OTHER): Payer: Medicare Other

## 2011-01-23 ENCOUNTER — Encounter: Payer: Self-pay | Admitting: Cardiology

## 2011-01-23 DIAGNOSIS — I4891 Unspecified atrial fibrillation: Secondary | ICD-10-CM

## 2011-01-23 DIAGNOSIS — Z7901 Long term (current) use of anticoagulants: Secondary | ICD-10-CM

## 2011-01-23 NOTE — Medication Information (Signed)
Summary: Coumadin Clinic  Anticoagulant Therapy  Managed by: Cloyde Reams, RN, BSN PCP: Newt Lukes MD Supervising MD: Gala Romney MD, Reuel Boom Indication 1: Atrial Fibrillation (ICD-427.31) Lab Used: [facility] Site: [facility] INR POC 1.2 INR RANGE 2 - 3  Dietary changes: no    Health status changes: no    Bleeding/hemorrhagic complications: no    Recent/future hospitalizations: no    Any changes in medication regimen? no    Recent/future dental: no  Any missed doses?: no       Is patient compliant with meds? yes       Allergies: 1)  ! Pcn  Anticoagulation Management History:      The patient is taking warfarin and comes in today for a routine follow up visit.  Positive risk factors for bleeding include an age of 75 years or older.  The bleeding index is 'intermediate risk'.  Positive CHADS2 values include History of CHF, History of HTN, and Age > 61 years old.  The start date was 01/30/2006.  His last INR was 1.9.  Anticoagulation responsible provider: Demarion Pondexter MD, Reuel Boom.  INR POC: 1.2.  Cuvette Lot#: [id number].  Exp: 12/2011.    Anticoagulation Management Assessment/Plan:      The target INR is 2.0-3.0.  The next INR is due 01/23/2011.  Anticoagulation instructions were given to patient/wife.  Results were reviewed/authorized by Cloyde Reams, RN, BSN.  He was notified by Cloyde Reams RN.         Prior Anticoagulation Instructions: Cont current regimen Return to clinic on February 15th at 2:30  Current Anticoagulation Instructions: INR 1.2  Take 1.5 tablets today, then increase dosage to 1 tablet daily except 1/2 tablet on Tuesdays and Saturdays.  Recheck in 1 week.

## 2011-01-28 ENCOUNTER — Telehealth: Payer: Self-pay | Admitting: Internal Medicine

## 2011-01-29 ENCOUNTER — Encounter: Payer: Self-pay | Admitting: Internal Medicine

## 2011-01-29 DIAGNOSIS — I4891 Unspecified atrial fibrillation: Secondary | ICD-10-CM

## 2011-01-29 NOTE — Medication Information (Signed)
Summary: rov/ejm  Anticoagulant Therapy  Managed by: Georgina Pillion, PharmD PCP: Newt Lukes MD Supervising MD: Shirlee Latch MD, Marshawn Normoyle Indication 1: Atrial Fibrillation (ICD-427.31) Lab Used: [facility] Site: [facility] INR POC 1.8 INR RANGE 2 - 3  Dietary changes: no    Health status changes: no    Bleeding/hemorrhagic complications: no    Recent/future hospitalizations: no    Any changes in medication regimen? no    Recent/future dental: no  Any missed doses?: no       Is patient compliant with meds? yes       Allergies: 1)  ! Pcn  Anticoagulation Management History:      Positive risk factors for bleeding include an age of 75 years or older.  The bleeding index is 'intermediate risk'.  Positive CHADS2 values include History of CHF, History of HTN, and Age > 35 years old.  The start date was 01/30/2006.  His last INR was 1.9.  Anticoagulation responsible provider: Shirlee Latch MD, Tim Wilhide.  INR POC: 1.8.  Cuvette Lot#: [id number].  Exp: 12/2011.    Anticoagulation Management Assessment/Plan:      The target INR is 2.0-3.0.  The next INR is due 02/04/2011.  Anticoagulation instructions were given to patient/wife.  Results were reviewed/authorized by Georgina Pillion, PharmD.  He was notified by Georgina Pillion PharmD.         Prior Anticoagulation Instructions: INR 1.2  Take 1.5 tablets today, then increase dosage to 1 tablet daily except 1/2 tablet on Tuesdays and Saturdays.  Recheck in 1 week.    Current Anticoagulation Instructions: Take 1 1/2 tablets today. Then change regimen to take 1 tablet daily EXCEPT for 1/2 tablet on Saturdays only.  INR 1.8

## 2011-01-29 NOTE — Assessment & Plan Note (Signed)
Summary: per checkout/ sf   Visit Type:  Follow-up Primary Provider:  Newt Lukes MD  CC:  no complaints.  History of Present Illness: Mr. Leverette is a delightful 77 year old male with history congestive heart failure secondary to ischemic cardiomyopathy.  He has a totally occluded LAD on cardiac catheterization with nonobstructive disease elsewhere.  His EF previously in the 25-30% range. [place] echo 04/2010 showed EF 45% with mod-sev MR. He is status post BiV ICD.  He has also had problems with atrial fibrillation and undergone several cardioversions (and severeal anti-arrhhtymics) but now with chronic atrial fib.  He has class 2 symptoms.  In December ICD generator at [facility]. Changed for BiV-PPM. Had extensive scar tissue at pocket site. Post-op had large hematoma which was followed closely by EP. Coumadin held for a month. Feels much, much better since starting Zoloft. Breathing well. No CP, no fevers chills. Weight very stable. No palpitations. INR 1.2 today. (Hasn't gone over 1/9 in past month). Hasn't had to take extra lasix a long time.     Current Medications (verified): 1)  Lasix 40 Mg Tabs (Furosemide) .Marland Kitchen.. 1 Once Daily 2)  Zyrtec Allergy 10 Mg Tabs (Cetirizine Hcl) .Marland Kitchen.. 1 Qd 3)  Ocuvite  Tabs (Multiple Vitamins-Minerals) .Marland Kitchen.. 1 Once Daily 4)  Potassium Chloride 20 Meq  Pack (Potassium Chloride) .Marland Kitchen.. 1 Once Daily 5)  Lisinopril 10 Mg  Tabs (Lisinopril) .... Take 1/2  Tab By Mouth Daily 6)  Omeprazole 20 Mg Tbec (Omeprazole) .Marland Kitchen.. 1 Once Daily 7)  Finasteride 5 Mg Tabs (Finasteride) .Marland Kitchen.. 1 Once Daily 8)  Melatonin 3 Mg Caps (Melatonin) .... At Bedtime 9)  Pravastatin Sodium 40 Mg Tabs (Pravastatin Sodium) .... Take One Tablet By Mouth Daily At Bedtime 10)  Vitamin D (Ergocalciferol) 50000 Unit Caps (Ergocalciferol) .... Take 1 Q Friday 11)  Clonazepam 0.5 Mg Tabs (Clonazepam) .... 1/2-1 Tab By Mouth  Two Times A Day As Needed 12)  Mirtazapine 15 Mg Tabs (Mirtazapine) ....  1/2-1 Tab At Bedtime As Needed 13)  Astelin 137 Mcg/spray Soln (Azelastine Hcl) .Marland Kitchen.. 1 Spray Each Nostril  Every Morning 14)  Fluticasone Propionate 50 Mcg/act Susp (Fluticasone Propionate) .Marland Kitchen.. 1 Spray Each Nostril  Every Morning 15)  Systane 0.4-0.3 % Soln (Polyethyl Glycol-Propyl Glycol) .... Left Eye At Bedtime 16)  Doxazosin Mesylate 4 Mg Tabs (Doxazosin Mesylate) .Marland Kitchen.. 1 At Bedtime 17)  Flomax 0.4 Mg Caps (Tamsulosin Hcl) .... Take 2 By Mouth Once Daily 18)  Zoloft 50 Mg Tabs (Sertraline Hcl) .Marland Kitchen.. 1 By Mouth Once Daily  Allergies (verified): 1)  ! Pcn  Past History:  Past Medical History:  1. CHF due to ischemic CM          a. Echo 8/08 EF: 20-30% moderate to severe MR, mild AI  2. Atrial fib -anticoag         a. Amiodarone stopped due to recurrent AF 1/11  3.  CAD         a. s/p cath 9/06 chronic total occlusion LAD with right-to-left and left-to-left collaterals,               minimal nonobstructive CAD, in LCX and RCA. EF was 45% at that time.  4. Moderate to severe, MR  5. History of anemia secondary to GI bleed.  6. History of symptomatic bradycardia         a. s/p BiV ICD -- changed to BiV PPM 12/11  7. Hypertension  8. Hyperlipidemia  9. Retinal bleeding after  cataract removal 10. Diverticulitis. 11. Osteoporosis with severe kyphosis 12. BPH 13. Hiatal hernia. 14. Glaucoma. 15. Post-op urinary retention  MD roster:   cards - benshimon  Review of Systems       As per HPI and past medical history; otherwise all systems negative.   Vital Signs:  Patient profile:   77 year old male Height:      68 inches Weight:      162 pounds BMI:     24.72 Pulse rate:   77 / minute BP sitting:   106 / 60  Vitals Entered By: Caralee Ates CMA (January 16, 2011 3:13 PM)  Physical Exam  General:  frail kyphotic. no resp difficulty HEENT: normal Neck: supple. JVP 7 Carotids 2+ bilat; no bruits. No lymphadenopathy or thryomegaly appreciated. Cor: PMI nondisplaced.  Regular rate & rhythm. No rubs, gallops,3/6 SEM at apex Moderate hematoma over device. No erythema. Lungs: clear Abdomen: soft, nontender, nondistended. No hepatosplenomegaly. No bruits or masses. Good bowel sounds. Extremities: no cyanosis, clubbing, rash, edema Neuro: alert & orientedx3, cranial nerves grossly intact. moves all 4 extremities w/o difficulty. affect pleasant. no drift.     PPM Specifications Following MD:  Lewayne Bunting, MD     PPM Vendor:  [employer]     PPM Model Number:  [id number]     PPM Serial Number:  [id number] PPM DOI:  10/31/2010     PPM Implanting MD:  Lewayne Bunting, MD  Lead 1    Location: RA     DOI: 08/14/2005     Model #: [id number]     Serial #: [id number]     Status: active Lead 2    Location: RV     DOI: 08/14/2005     Model #: [id number]     Serial #: [id number]     Status: active Lead 3    Location: LV     DOI: 09/05/2006     Model #: [id number]     Serial #: [id number]     Status: active  Magnet Response Rate:  BOL 85 ERI 65  Indications:  CM   PPM Follow Up Pacer Dependent:  Yes      Parameters Mode:  VVI     Lower Rate Limit:  75     Upper Rate Limit:  120  ICD Specifications Following MD:  Lewayne Bunting, MD     Referring MD:  [place] ICD Vendor:  [employer]onic     ICD Model Number:  [id number]     ICD Serial Number:  [id number] ICD DOI:  09/05/2006     ICD Implanting MD:  Lewayne Bunting, MD  Lead 1:    Location: RA     DOI: 08/14/2005     Model #: [id number]     Serial #: [id number]     Status: active Lead 2:    Location: RV     DOI: 09/05/2006     Model #: [id number]     Serial #: [id number]     Status: active Lead 3:    Location: LV     DOI: 09/05/2006     Model #: [id number]     Serial #: [id number]     Status: active Lead 4:    Location: RA     DOI: 08/14/2005     Model #: [id number]     Serial #: [id number]     Status: capped  Indications::  ICM, CHF  Explantation Comments: 10/31/2010 Medtronic [id number]  ICD Follow Up  ICD Dependent:  Yes      Episodes Coumadin:  Yes  Brady  Parameters Mode DDDR     Lower Rate Limit:  75     Upper Rate Limit 130 PAV 130     Sensed AV Delay:  100  Tachy Zones VF:  200     VT:  250 FVT VIA VF     VT1:  200     Impression & Recommendations:  Problem # 1:  SYSTOLIC HEART FAILURE, CHRONIC (ICD-428.22) Doing well. NYHA II. Volume status on high end of normal. Told him to follow weights closely and take exta lasix as needed. Medication titration otherwise limited by hypotension.  Problem # 2:  CAD, NATIVE VESSEL (ICD-414.01) Stable. No evidence of ischemia. Continue current regimen. Continue to hold ASA for now until hematoma resolved.  Problem # 3:  HEMATOMA Discussed with EP. Will continue to follow. OK to continue coumadin but run INRs on low end of range.   Problem # 4:  DEPRESSION (ICD-311) Much improved with Zoloft.   Patient Instructions: 1)  When you come to coumadin on Wed 2/22 ask for [facility] to look at your hematoma 2)  Follow up in 4 months.

## 2011-01-30 ENCOUNTER — Encounter: Payer: Self-pay | Admitting: Cardiovascular Disease

## 2011-01-30 ENCOUNTER — Encounter (INDEPENDENT_AMBULATORY_CARE_PROVIDER_SITE_OTHER): Payer: Medicare Other

## 2011-01-30 DIAGNOSIS — Z7901 Long term (current) use of anticoagulants: Secondary | ICD-10-CM

## 2011-01-30 DIAGNOSIS — I4891 Unspecified atrial fibrillation: Secondary | ICD-10-CM

## 2011-02-07 NOTE — Medication Information (Signed)
Summary: rov/tm  Anticoagulant Therapy  Managed by: Georgina Pillion, PharmD PCP: Newt Lukes MD Supervising MD: Excell Seltzer MD, Casimiro Needle Indication 1: Atrial Fibrillation (ICD-427.31) Lab Used: [facility] Site: [facility] INR POC 2.0 INR RANGE 2 - 3  Dietary changes: no    Health status changes: no    Bleeding/hemorrhagic complications: no    Recent/future hospitalizations: no    Any changes in medication regimen? no    Recent/future dental: no  Any missed doses?: no       Is patient compliant with meds? yes       Allergies: 1)  ! Pcn  Anticoagulation Management History:      Positive risk factors for bleeding include an age of 30 years or older.  The bleeding index is 'intermediate risk'.  Positive CHADS2 values include History of CHF, History of HTN, and Age > 81 years old.  The start date was 01/30/2006.  His last INR was 1.9.  Anticoagulation responsible provider: Excell Seltzer MD, Casimiro Needle.  INR POC: 2.0.  Exp: 12/2011.    Anticoagulation Management Assessment/Plan:      The target INR is 2.0-3.0.  The next INR is due 02/18/2011.  Anticoagulation instructions were given to patient/wife.  Results were reviewed/authorized by Georgina Pillion, PharmD.  He was notified by Georgina Pillion PharmD.         Prior Anticoagulation Instructions: Take 1 1/2 tablets today. Then change regimen to take 1 tablet daily EXCEPT for 1/2 tablet on Saturdays only.  INR 1.8  Current Anticoagulation Instructions: Continue current regimen of 1 tablet (5 mg) daily EXCEPT for 1/2 tablet (2.5 mg) on Saturdays.  INR 2.0  Appended Document: [facility]    Anticoagulant Therapy  Managed by: Georgina Pillion, PharmD PCP: Newt Lukes MD Supervising MD: Excell Seltzer MD, Casimiro Needle Indication 1: Atrial Fibrillation (ICD-427.31) Lab Used: [facility] Site: [facility] INR RANGE 2 -2.5          Comments: Dr. Ladona Ridgel saw patient in the office and desires lower end of the range  for this patient for 2-2.5  Allergies: 1)  ! Pcn  Anticoagulation Management History:      Positive risk factors for bleeding include an age of 33 years or older.  The bleeding index is 'intermediate risk'.  Positive CHADS2 values include History of CHF, History of HTN, and Age > 16 years old.  The start date was 01/30/2006.  His last INR was 1.9.  Anticoagulation responsible provider: Excell Seltzer MD, Casimiro Needle.  Exp: 12/2011.    Anticoagulation Management Assessment/Plan:      The target INR is 2.0-3.0.  The next INR is due 02/18/2011.  Anticoagulation instructions were given to patient/wife.  Results were reviewed/authorized by Georgina Pillion, PharmD.         Prior Anticoagulation Instructions: Continue current regimen of 1 tablet (5 mg) daily EXCEPT for 1/2 tablet (2.5 mg) on Saturdays.  INR 2.0

## 2011-02-07 NOTE — Progress Notes (Signed)
Summary: finasteride  Phone Note Refill Request Message from:  Fax from Pharmacy on January 28, 2011 11:43 AM  Refills Requested: Medication #1:  FINASTERIDE 5 MG TABS 1 once daily   Last Ref[facility]: [facility] [phone]   Method Requested: Electronic Initial call taken by: Orlan Leavens RMA,  January 28, 2011 11:43 AM    Prescriptions: FINASTERIDE 5 MG TABS (FINASTERIDE) 1 once daily  #30 Tablet x 11   Entered by:   Orlan Leavens RMA   Authorized by:   Newt Lukes MD   Signed by:   Orlan Leavens RMA on 01/28/2011   Method used:   Electronically to        [facility] (ret[facility])       [address]       [place], [place]  [address]       Ph: [phone] or [phone]       Fax: [phone]   RxID:   [id number]   Appended Document: finasteride & lasix    Clinical Lists Changes  Medications: Rx of LASIX 40 MG TABS (FUROSEMIDE) 1 once daily;  #30 Tablet x 11;  Signed;  Entered by: Orlan Leavens RMA;  Authorized by: Newt Lukes MD;  Method used: Electronically to [facility] [address], [place], [place]  [address], Ph: [phone] or [phone], Fax: [phone]    Prescriptions: LASIX 40 MG TABS (FUROSEMIDE) 1 once daily  #30 Tablet x 11   Entered by:   Orlan Leavens RMA   Authorized by:   Newt Lukes MD   Signed by:   Orlan Leavens RMA on 01/28/2011   Method used:   Electronically to        [facility] (retail)       [address]       [place], [place]  [address]       Ph: [phone] or [phone]       Fax: [phone]   RxID:   [id number]

## 2011-02-07 NOTE — Letter (Signed)
Summary: [employer] - Heart Failure Management  [employer] - Heart Failure Management   Imported By: Marylou Mccoy 01/31/2011 16:06:51  _____________________________________________________________________  External Attachment:    Type:   Image     Comment:   External Document

## 2011-02-08 ENCOUNTER — Encounter: Payer: Self-pay | Admitting: Internal Medicine

## 2011-02-12 ENCOUNTER — Encounter: Payer: Self-pay | Admitting: Internal Medicine

## 2011-02-18 ENCOUNTER — Encounter: Payer: Self-pay | Admitting: Internal Medicine

## 2011-02-18 ENCOUNTER — Encounter (INDEPENDENT_AMBULATORY_CARE_PROVIDER_SITE_OTHER): Payer: Medicare Other

## 2011-02-18 DIAGNOSIS — Z7901 Long term (current) use of anticoagulants: Secondary | ICD-10-CM

## 2011-02-18 DIAGNOSIS — I4891 Unspecified atrial fibrillation: Secondary | ICD-10-CM

## 2011-02-28 NOTE — Medication Information (Signed)
Summary: rov/tp  Anticoagulant Therapy  Managed by: Geoffry Paradise, PharmD PCP: Newt Lukes MD Supervising MD: Tenny Craw MD, Gunnar Fusi Indication 1: Atrial Fibrillation (ICD-427.31) Lab Used: [facility] Site: [facility] INR POC 2 INR RANGE 2 -2.5           Allergies: 1)  ! Pcn  Anticoagulation Management History:      The patient is taking warfarin and comes in today for a routine follow up visit.  Positive risk factors for bleeding include an age of 13 years or older.  The bleeding index is 'intermediate risk'.  Positive CHADS2 values include History of CHF, History of HTN, and Age > 48 years old.  The start date was 01/30/2006.  His last INR was 1.9.  Anticoagulation responsible provider: Tenny Craw MD, Gunnar Fusi.  INR POC: 2.  Cuvette Lot#: [id number].  Exp: 01/2012.    Anticoagulation Management Assessment/Plan:      The target INR is 2.0-3.0.  The next INR is due 03/21/2011.  Anticoagulation instructions were given to patient/wife.  Results were reviewed/authorized by Geoffry Paradise, PharmD.         Prior Anticoagulation Instructions: Continue current regimen of 1 tablet (5 mg) daily EXCEPT for 1/2 tablet (2.5 mg) on Saturdays.  INR 2.0  Current Anticoagulation Instructions: INR:  2 (goal 2-3)  Your INR is at goal today.  Continue taking 1 tablet everyday except for 1/2 a tablet on Saturday.  Return to [facility] in 4 weeks for another INR check.

## 2011-03-04 ENCOUNTER — Telehealth: Payer: Self-pay | Admitting: Internal Medicine

## 2011-03-04 NOTE — Telephone Encounter (Signed)
Pt having weakness and feels bad

## 2011-03-04 NOTE — Telephone Encounter (Signed)
Called and spoke wife pt's wife.  He is not feeling well.  No fever. He says he feels like he use to feel  Very low BP 90/53.  This has all come on over the last week. Will discuss with Dr Ladona Ridgel and get back with patient

## 2011-03-05 NOTE — Telephone Encounter (Signed)
Discussed with Dr Ladona Ridgel and called patient's wife back and left a message to call me back

## 2011-03-06 NOTE — Telephone Encounter (Signed)
Discussed pt's symptoms with Dr Ladona Ridgel He was not sure as what to do.  He suggested could come in for elective blood cultures.  Patient has an up coming appointment on 4/19  I spoke with pt's wife today and and she says patient is feeling better today and and she just wants to keep a close watch on him and if he keeps progressing she will just bring him in on 4/19.  If things start to regress she will bring him in  for blood cultures prior to the appointment

## 2011-03-18 ENCOUNTER — Other Ambulatory Visit: Payer: Self-pay | Admitting: Internal Medicine

## 2011-03-20 ENCOUNTER — Encounter: Payer: Self-pay | Admitting: Internal Medicine

## 2011-03-21 ENCOUNTER — Ambulatory Visit (INDEPENDENT_AMBULATORY_CARE_PROVIDER_SITE_OTHER): Payer: Medicare Other | Admitting: Internal Medicine

## 2011-03-21 ENCOUNTER — Encounter: Payer: Self-pay | Admitting: Internal Medicine

## 2011-03-21 ENCOUNTER — Ambulatory Visit (INDEPENDENT_AMBULATORY_CARE_PROVIDER_SITE_OTHER): Payer: Medicare Other | Admitting: *Deleted

## 2011-03-21 VITALS — BP 124/78 | HR 80 | Ht 68.0 in | Wt 162.0 lb

## 2011-03-21 DIAGNOSIS — I4891 Unspecified atrial fibrillation: Secondary | ICD-10-CM

## 2011-03-21 DIAGNOSIS — Z95810 Presence of automatic (implantable) cardiac defibrillator: Secondary | ICD-10-CM

## 2011-03-21 DIAGNOSIS — I509 Heart failure, unspecified: Secondary | ICD-10-CM

## 2011-03-21 DIAGNOSIS — I5022 Chronic systolic (congestive) heart failure: Secondary | ICD-10-CM

## 2011-03-21 DIAGNOSIS — I442 Atrioventricular block, complete: Secondary | ICD-10-CM

## 2011-03-21 DIAGNOSIS — Z950 Presence of cardiac pacemaker: Secondary | ICD-10-CM

## 2011-03-21 DIAGNOSIS — Z7901 Long term (current) use of anticoagulants: Secondary | ICD-10-CM | POA: Insufficient documentation

## 2011-03-21 LAB — POCT INR: INR: 2

## 2011-03-21 NOTE — Assessment & Plan Note (Signed)
His symptoms are between class II and class III. I suspect her recent worsening in fatigue and malaise may be related to his heart failure. That said he has no clear-cut volume overload. His fluid index is low. For now I will have him continue his current medications. His blood pressure is not particularly low so I will not reduce his heart failure drugs.

## 2011-03-21 NOTE — Assessment & Plan Note (Signed)
His defibrillator was removed and a biventricular pacemaker is present. His device is working normally. There is no evidence of infection. We'll check surveillance blood cultures and other labs include a sedimentation rate.

## 2011-03-21 NOTE — Progress Notes (Signed)
HPI [name] returns today for followup. He is a pleasant elderly man with long-standing complete heart block and congestive heart failure. He has known coronary disease and is status post myocardial infarction. He underwent ICD generator removal and insertion of a biventricular pacemaker several months ago. The procedure was complicated by a large hematoma. This was managed conservatively and over several months it is finally resolved. There's been no drainage fevers or chills. He has noted over the last several weeks some increasing fatigue and weakness. He notes that he has less energy than usual. Despite this he attempts to remain active. Allergies  Allergen Reactions  . Penicillins      Current Outpatient Prescriptions  Medication Sig Dispense Refill  . azelastine (ASTELIN) 137 MCG/SPRAY nasal spray 1 spray by Nasal route daily. Use in each nostril as directed       . cetirizine (ZYRTEC) 10 MG tablet Take 10 mg by mouth daily.        . clonazePAM (KLONOPIN) 0.5 MG tablet Take 0.5 mg by mouth 2 (two) times daily as needed.        . doxazosin (CARDURA) 4 MG tablet Take 4 mg by mouth at bedtime.        . finasteride (PROSCAR) 5 MG tablet Take 5 mg by mouth daily.        . fluticasone (FLONASE) 50 MCG/ACT nasal spray 2 sprays by Nasal route daily.        . furosemide (LASIX) 40 MG tablet Take 40 mg by mouth daily.        Marland Kitchen lisinopril (PRINIVIL,ZESTRIL) 10 MG tablet Take 5 mg by mouth daily.        . Melatonin 3 MG CAPS Take by mouth at bedtime.        . mirtazapine (REMERON) 15 MG tablet Take 15 mg by mouth at bedtime as needed.        . Multiple Vitamins-Minerals (OCUVITE PO) Take by mouth daily.        Bertram Gala Glycol-Propyl Glycol (SYSTANE) 0.4-0.3 % GEL Apply to eye at bedtime as needed.        . potassium chloride SA (K-DUR,KLOR-CON) 20 MEQ tablet Take 20 mEq by mouth daily.        . pravastatin (PRAVACHOL) 40 MG tablet Take 40 mg by mouth daily.        . sertraline (ZOLOFT) 50 MG  tablet Take 50 mg by mouth daily.        . Tamsulosin HCl (FLOMAX) 0.4 MG CAPS Take 0.8 mg by mouth daily.       . Vitamin D, Ergocalciferol, (DRISDOL) 50000 UNITS CAPS TAKE 1 CAPSULE EVERY FRIDAY  12 capsule  2  . warfarin (COUMADIN) 5 MG tablet Take by mouth as directed.           Past Medical History  Diagnosis Date  . CHF (congestive heart failure)   . Ischemic cardiomyopathy   . Atrial fibrillation   . CAD (coronary artery disease)   . Mitral regurgitation   . Anemia   . Symptomatic bradycardia   . HTN (hypertension)   . HLD (hyperlipidemia)   . Diverticulitis   . Osteoporosis   . BPH (benign prostatic hypertrophy)   . Hiatal hernia   . Glaucoma   . Urinary retention     ROS:   All systems reviewed and negative except as noted in the HPI.   Past Surgical History  Procedure Date  . Cardiac defibrillator placement   .  Cataract extraction   . Hernia repair   . Hand surgery   . Eye surgery 2009 & 2010     No family history on file.   History   Social History  . Marital Status: Married    Spouse Name: N/A    Number of Children: 3  . Years of Education: N/A   Occupational History  . retired    Social History Main Topics  . Smoking status: Never Smoker   . Smokeless tobacco: Not on file  . Alcohol Use: No  . Drug Use: Not on file  . Sexually Active: Not on file   Other Topics Concern  . Not on file   Social History Narrative  . No narrative on file     BP 124/78  Pulse 80  Ht 5\' 8"  (1.727 m)  Wt 162 lb (73.483 kg)  BMI 24.63 kg/m2  Physical Exam:  Well appearing NAD HEENT: Unremarkable Neck:  No JVD, no thyromegally Lymphatics:  No adenopathy Back:  No CVA tenderness Lungs:  Clear.his pacemaker incision appears to be well-healed. There is minimal residual hematoma. HEART:  Regular rate rhythm, no murmurs, no rubs, no clicks Abd:  Flat, positive bowel sounds, no organomegally, no rebound, no guarding Ext:  2 plus pulses, no edema, no  cyanosis, no clubbing Skin:  No rashes no nodules Neuro:  CN II through XII intact, motor grossly intact DEVICE  Normal device function.  See PaceArt for details.   Assess/Plan:

## 2011-03-21 NOTE — Assessment & Plan Note (Signed)
His ventricular rate is controlled with his underlying complete heart block. He is currently asymptomatic. He'll continue his anticoagulation.

## 2011-03-22 ENCOUNTER — Other Ambulatory Visit (INDEPENDENT_AMBULATORY_CARE_PROVIDER_SITE_OTHER): Payer: Medicare Other

## 2011-03-22 DIAGNOSIS — Z950 Presence of cardiac pacemaker: Secondary | ICD-10-CM

## 2011-03-22 DIAGNOSIS — I4891 Unspecified atrial fibrillation: Secondary | ICD-10-CM

## 2011-03-22 LAB — CBC WITH DIFFERENTIAL/PLATELET
Basophils Absolute: 0 10*3/uL (ref 0.0–0.1)
Basophils Relative: 0.2 % (ref 0.0–3.0)
Eosinophils Absolute: 0.1 10*3/uL (ref 0.0–0.7)
Hemoglobin: 12.8 g/dL — ABNORMAL LOW (ref 13.0–17.0)
Lymphocytes Relative: 12.2 % (ref 12.0–46.0)
MCHC: 34.7 g/dL (ref 30.0–36.0)
Monocytes Relative: 10.8 % (ref 3.0–12.0)
Neutro Abs: 7.6 10*3/uL (ref 1.4–7.7)
Neutrophils Relative %: 76.1 % (ref 43.0–77.0)
RBC: 3.74 Mil/uL — ABNORMAL LOW (ref 4.22–5.81)
RDW: 14.2 % (ref 11.5–14.6)

## 2011-03-28 LAB — CULTURE, BLOOD (SINGLE): Organism ID, Bacteria: NO GROWTH

## 2011-04-02 ENCOUNTER — Encounter: Payer: Self-pay | Admitting: Physician Assistant

## 2011-04-02 ENCOUNTER — Ambulatory Visit (INDEPENDENT_AMBULATORY_CARE_PROVIDER_SITE_OTHER): Payer: Medicare Other | Admitting: Physician Assistant

## 2011-04-02 ENCOUNTER — Ambulatory Visit (INDEPENDENT_AMBULATORY_CARE_PROVIDER_SITE_OTHER)
Admission: RE | Admit: 2011-04-02 | Discharge: 2011-04-02 | Disposition: A | Discharge status: Home / Self Care | Payer: Medicare Other | Source: Ambulatory Visit | Attending: Physician Assistant | Admitting: Physician Assistant

## 2011-04-02 ENCOUNTER — Telehealth: Payer: Self-pay | Admitting: *Deleted

## 2011-04-02 VITALS — BP 120/80 | HR 96 | Ht 74.0 in | Wt 164.0 lb

## 2011-04-02 DIAGNOSIS — I5022 Chronic systolic (congestive) heart failure: Secondary | ICD-10-CM

## 2011-04-02 DIAGNOSIS — R238 Other skin changes: Secondary | ICD-10-CM | POA: Insufficient documentation

## 2011-04-02 DIAGNOSIS — R5381 Other malaise: Secondary | ICD-10-CM

## 2011-04-02 DIAGNOSIS — R0609 Other forms of dyspnea: Secondary | ICD-10-CM

## 2011-04-02 DIAGNOSIS — R5383 Other fatigue: Secondary | ICD-10-CM

## 2011-04-02 DIAGNOSIS — R05 Cough: Secondary | ICD-10-CM | POA: Insufficient documentation

## 2011-04-02 DIAGNOSIS — L908 Other atrophic disorders of skin: Secondary | ICD-10-CM

## 2011-04-02 MED ORDER — FUROSEMIDE 40 MG PO TABS: 60.0000 mg | ORAL_TABLET | Freq: Every day | ORAL | Status: DC

## 2011-04-02 MED ORDER — POTASSIUM CHLORIDE CRYS ER 20 MEQ PO TBCR: 30.0000 meq | EXTENDED_RELEASE_TABLET | Freq: Every day | ORAL | Status: DC

## 2011-04-02 NOTE — Assessment & Plan Note (Addendum)
Get chest x-ray as noted.  If he has any areas of consolidation, he will need antibiotics.  Repeat a CBC today to look for leukocytosis.

## 2011-04-02 NOTE — Telephone Encounter (Signed)
Pt's wife called stating pt was not feeling well for several days, she reports his wt is up slightly, it normally runs 155-156 today it was 158.5.  She states she notices he seems to be more SOB at times, but he denies, no edema that she can see, no nausea, she does report that he occ. Has a cough, BP today 110/64, home care RN out today and reports ? Rattles in lungs.  Pt would like to be seen today, ask him to send a transmission but she states they have never gotten a new home box since he had his defib replaced.  Pt sch to see Tereso Newcomer this afternoon at 2:30

## 2011-04-02 NOTE — Progress Notes (Addendum)
History of Present Illness: Primary Cardiologist:  Dr. Arvilla Meres  [name] is a 77 y.o. male with a history congestive heart failure secondary to ischemic cardiomyopathy.  He has a totally occluded LAD on cardiac catheterization in 2006 with nonobstructive disease elsewhere.  His EF previously was in the 25-30% range. However, an echo 04/2010 showed EF 45% with mod-sev MR. He is status post BiV ICD.  He has also had problems with atrial fibrillation and undergone several cardioversions (and severeal anti-arrhhtymics) but now with chronic atrial fib.  In 12/11, his ICD generator was at [facility] and this was changed to a BiV-PPM.  He had extensive scar tissue at pocket site and, post-op had a large hematoma which was followed closely by EP.  Coumadin was held for a month.   He presents today with complaints of not feeling well for the last couple of weeks.  He has a cough in the mornings.  He has difficulty bringing up sputum.  However, when he does bring up sputum it appears to be drained.  He denies any hemoptysis.  He denies any fevers or chills.  He does note increased dyspnea with exertion.  He is describing probable NYHA class III symptoms.  He sleeps in a recliner due to his back problems.  He denies PND.  He feels that his edema is stable.  His weight had gone down to about 155 a few weeks ago.  Today he weighed himself at 158.  He did take extra Lasix recently times one with slight improvement in his weight no change in his breathing.  He denies chest pain.  He denies syncope.  Past Medical History  Diagnosis Date  . Systolic CHF, chronic   . Ischemic cardiomyopathy     a.  echo 8/08: EF 20-30%;  b. echo 5/11: EF 45%, mod to severe MR  . Permanent atrial fibrillation     Amiodarone stopped due to recurrent AFib  . CAD (coronary artery disease)     a. cath 9/06: occluded LAD with L-L and R-L collaterals; LM 20%, CFX 20%, OM1 30%, RCA 20-30%, EF 45%  . Mitral regurgitation     a. mod to  severe by echo 5/11  . Anemia     2/2 GI bleed  . Symptomatic bradycardia     a. s/p BiV-ICD;  b. s/p change to BiV pacer only 12/11  . HTN (hypertension)   . HLD (hyperlipidemia)   . Diverticulitis   . Osteoporosis   . BPH (benign prostatic hypertrophy)   . Hiatal hernia   . Glaucoma   . Urinary retention   . Depression   . Cataracts, bilateral     Retinal bleeding after cataract removal    Current Outpatient Prescriptions  Medication Sig Dispense Refill  . azelastine (ASTELIN) 137 MCG/SPRAY nasal spray 1 spray by Nasal route daily. Use in each nostril as directed       . cetirizine (ZYRTEC) 10 MG tablet Take 10 mg by mouth daily.        . clonazePAM (KLONOPIN) 0.5 MG tablet Take 0.5 mg by mouth 2 (two) times daily as needed.        . doxazosin (CARDURA) 4 MG tablet Take 4 mg by mouth at bedtime.        . finasteride (PROSCAR) 5 MG tablet Take 5 mg by mouth daily.        . fluticasone (FLONASE) 50 MCG/ACT nasal spray 2 sprays by Nasal route daily.        Marland Kitchen  furosemide (LASIX) 40 MG tablet Take 40 mg by mouth daily.        Marland Kitchen lisinopril (PRINIVIL,ZESTRIL) 10 MG tablet Take 5 mg by mouth daily.        . Melatonin 3 MG CAPS Take by mouth at bedtime.        . mirtazapine (REMERON) 15 MG tablet Take 15 mg by mouth at bedtime as needed.        . Multiple Vitamins-Minerals (OCUVITE PO) Take by mouth daily.        Bertram Gala Glycol-Propyl Glycol (SYSTANE) 0.4-0.3 % GEL Apply to eye at bedtime as needed.        . potassium chloride SA (K-DUR,KLOR-CON) 20 MEQ tablet Take 20 mEq by mouth daily.        . pravastatin (PRAVACHOL) 40 MG tablet Take 40 mg by mouth daily.        . sertraline (ZOLOFT) 50 MG tablet Take 50 mg by mouth daily.        . Tamsulosin HCl (FLOMAX) 0.4 MG CAPS Take 0.8 mg by mouth daily.       . Vitamin D, Ergocalciferol, (DRISDOL) 50000 UNITS CAPS TAKE 1 CAPSULE EVERY FRIDAY  12 capsule  2  . warfarin (COUMADIN) 5 MG tablet Take by mouth as directed.          Allergies   Allergen Reactions  . Penicillins     History  Substance Use Topics  . Smoking status: Never Smoker   . Smokeless tobacco: Not on file  . Alcohol Use: No    ROS:  Please see history of present illness.  There is a reported history of snoring as well as witnessed apneic episodes.  No melena.  No hematochezia.  No vomiting.  He did have recent diarrhea, however this is resolved.  All other systems reviewed and negative.  Vital Signs: BP 120/80  Pulse 96  Ht 6\' 2"  (1.88 m)  Wt 164 lb (74.39 kg)  BMI 21.06 kg/m2  PHYSICAL EXAM: Well nourished, well developed, in no acute distress HEENT: normal Neck: JVP 7-8 cm Cardiac:  normal S1, S2; RRR; 2/6 holosystolic murmur heard best at the apex; No S3 Lungs:  Course breath sounds at the bases bilaterally, no wheezing, rhonchi or rales Abd: soft, nontender, no hepatomegaly Ext: 2+ bilateral ankle edema Skin: warm and dry; He has an area of excoriation it seems to be consistent with his belt line with skin desquamation or at his lower chest/upper abdomen, no erythema Neuro:  CNs 2-12 intact, no focal abnormalities noted Psych: Normal affect  EKG:  Ventricular paced, heart rate 78, underlying atrial fibrillation  ASSESSMENT AND PLAN:  Agree. Seems fluid overloaded. Please f/u with them and make sure extra lasix is working. If not, can use a dose of metolazone.

## 2011-04-02 NOTE — Assessment & Plan Note (Signed)
He has some symptoms of sleep apnea.  We will need to keep this in mind as we go forward regarding whether or not to proceed with testing for sleep apnea.  As noted, obtain a TSH and CBC today.

## 2011-04-02 NOTE — Assessment & Plan Note (Signed)
It appears that his belt line is rubbing up against his lower chest/upper abdomen and causing some skin breakdown.  We'll try to get the nurse at his living facility to come out and clean this area a few times a week and keep it dressed while he is using a belt to help prevent further breakdown.

## 2011-04-02 NOTE — Assessment & Plan Note (Addendum)
He has had increased shortness of breath as well as slight increase in his weight.  I suspect he is experiencing acute on chronic systolic heart failure.  I will increase his Lasix from 40 mg to 60 mg a day.  I will also increase his potassium to 30 mEq a day.  CBC was done recently and demonstrated normal hemoglobin.  Today, I will obtain a basic metabolic panel, TSH and BNP.  We will check his Optivol from his device.  He will have a chest x-ray.  Check an echo as it has been one year since his last study to reassess his LV function and his mitral regurgitation.  We'll bring him back in close follow up in one week with Dr. Gala Romney or myself on a day that Dr. Gala Romney is here.  Of note, his impedance is down on his Optivol indicating increasing fluid.

## 2011-04-02 NOTE — Patient Instructions (Addendum)
Your physician recommends that you schedule a follow-up appointment in: 1 week with Tereso Newcomer, [facility]  Your physician recommends that you return for lab work in: today  A chest x-ray takes a picture of the organs and structures inside the chest, including the heart, lungs, and blood vessels. This test can show several things, including, whether the heart is enlarges; whether fluid is building up in the lungs; and whether pacemaker / defibrillator leads are still in place.  Your physician has requested that you have an echocardiogram. Echocardiography is a painless test that uses sound waves to create images of your heart. It provides your doctor with information about the size and shape of your heart and how well your heart's chambers and valves are working. This procedure takes approximately one hour. There are no restrictions for this procedure.  Your physician has recommended you make the following change in your medication: increase furosemide to 60 mg daily (1 1/2 tabs) also increase potassium to 30 mEq daily (1 1/2 tabs)

## 2011-04-03 LAB — CBC WITH DIFFERENTIAL/PLATELET
Basophils Relative: 0 % (ref 0.0–3.0)
Eosinophils Absolute: 0.1 10*3/uL (ref 0.0–0.7)
Eosinophils Relative: 0.5 % (ref 0.0–5.0)
Hemoglobin: 10.9 g/dL — ABNORMAL LOW (ref 13.0–17.0)
Lymphocytes Relative: 3.5 % — ABNORMAL LOW (ref 12.0–46.0)
MCHC: 34.2 g/dL (ref 30.0–36.0)
MCV: 99.3 fl (ref 78.0–100.0)
Monocytes Absolute: 1.5 10*3/uL — ABNORMAL HIGH (ref 0.1–1.0)
Neutro Abs: 9.2 10*3/uL — ABNORMAL HIGH (ref 1.4–7.7)
RBC: 3.22 Mil/uL — ABNORMAL LOW (ref 4.22–5.81)
WBC: 11.2 10*3/uL — ABNORMAL HIGH (ref 4.5–10.5)

## 2011-04-03 LAB — BASIC METABOLIC PANEL
BUN: 20 mg/dL (ref 6–23)
CO2: 28 mEq/L (ref 19–32)
GFR: 78.25 mL/min (ref >60.00)
Glucose, Bld: 115 mg/dL — ABNORMAL HIGH (ref 70–99)
Potassium: 4.2 mEq/L (ref 3.5–5.1)

## 2011-04-04 ENCOUNTER — Telehealth: Payer: Self-pay | Admitting: Physician Assistant

## 2011-04-04 ENCOUNTER — Encounter: Payer: Self-pay | Admitting: Internal Medicine

## 2011-04-04 NOTE — Telephone Encounter (Signed)
I spoke to patient and his wife.  He feels like his breathing is a little better.  He has lost about a half a pound. I asked him to take Lasix 80 mg today and tomorrow.  Take K+ 40 mEq today and tomorrow. On Sat. 5/5, he will resume Lasix 60 and K+ 30.

## 2011-04-07 ENCOUNTER — Emergency Department (HOSPITAL_COMMUNITY): Payer: Medicare Other

## 2011-04-07 ENCOUNTER — Inpatient Hospital Stay (HOSPITAL_COMMUNITY)
Admission: EM | Admit: 2011-04-07 | Discharge: 2011-04-09 | DRG: 293 | Disposition: A | Discharge status: Home Health | Payer: Medicare Other | Attending: Internal Medicine | Admitting: Internal Medicine

## 2011-04-07 DIAGNOSIS — I2510 Atherosclerotic heart disease of native coronary artery without angina pectoris: Secondary | ICD-10-CM | POA: Diagnosis present

## 2011-04-07 DIAGNOSIS — N400 Enlarged prostate without lower urinary tract symptoms: Secondary | ICD-10-CM | POA: Diagnosis present

## 2011-04-07 DIAGNOSIS — I509 Heart failure, unspecified: Secondary | ICD-10-CM | POA: Diagnosis present

## 2011-04-07 DIAGNOSIS — I252 Old myocardial infarction: Secondary | ICD-10-CM

## 2011-04-07 DIAGNOSIS — I5023 Acute on chronic systolic (congestive) heart failure: Secondary | ICD-10-CM

## 2011-04-07 DIAGNOSIS — M810 Age-related osteoporosis without current pathological fracture: Secondary | ICD-10-CM | POA: Diagnosis present

## 2011-04-07 DIAGNOSIS — I4891 Unspecified atrial fibrillation: Secondary | ICD-10-CM | POA: Diagnosis present

## 2011-04-07 DIAGNOSIS — R627 Adult failure to thrive: Secondary | ICD-10-CM | POA: Diagnosis present

## 2011-04-07 DIAGNOSIS — I2589 Other forms of chronic ischemic heart disease: Secondary | ICD-10-CM | POA: Diagnosis present

## 2011-04-07 DIAGNOSIS — I10 Essential (primary) hypertension: Secondary | ICD-10-CM | POA: Diagnosis present

## 2011-04-07 DIAGNOSIS — Z95810 Presence of automatic (implantable) cardiac defibrillator: Secondary | ICD-10-CM

## 2011-04-07 DIAGNOSIS — Z880 Allergy status to penicillin: Secondary | ICD-10-CM

## 2011-04-07 DIAGNOSIS — I059 Rheumatic mitral valve disease, unspecified: Secondary | ICD-10-CM | POA: Diagnosis present

## 2011-04-07 DIAGNOSIS — E785 Hyperlipidemia, unspecified: Secondary | ICD-10-CM | POA: Diagnosis present

## 2011-04-07 DIAGNOSIS — Z7901 Long term (current) use of anticoagulants: Secondary | ICD-10-CM

## 2011-04-07 LAB — COMPREHENSIVE METABOLIC PANEL
AST: 33 U/L (ref 0–37)
Albumin: 3.5 g/dL (ref 3.5–5.2)
Alkaline Phosphatase: 57 U/L (ref 39–117)
BUN: 22 mg/dL (ref 6–23)
Chloride: 102 mEq/L (ref 96–112)
Potassium: 3.8 mEq/L (ref 3.5–5.1)
Total Bilirubin: 0.4 mg/dL (ref 0.3–1.2)

## 2011-04-07 LAB — URINALYSIS, ROUTINE W REFLEX MICROSCOPIC
Bilirubin Urine: NEGATIVE
Hgb urine dipstick: NEGATIVE
Protein, ur: NEGATIVE mg/dL
Urobilinogen, UA: 0.2 mg/dL (ref 0.0–1.0)

## 2011-04-07 LAB — PRO B NATRIURETIC PEPTIDE: Pro B Natriuretic peptide (BNP): 1617 pg/mL — ABNORMAL HIGH (ref 0–450)

## 2011-04-07 LAB — DIFFERENTIAL
Basophils Absolute: 0 10*3/uL (ref 0.0–0.1)
Basophils Relative: 0 % (ref 0–1)
Monocytes Absolute: 0.8 10*3/uL (ref 0.1–1.0)
Neutro Abs: 6.1 10*3/uL (ref 1.7–7.7)
Neutrophils Relative %: 69 % (ref 43–77)

## 2011-04-07 LAB — POCT CARDIAC MARKERS
CKMB, poc: 5 ng/mL (ref 1.0–8.0)
Myoglobin, poc: 344 ng/mL (ref 12–200)

## 2011-04-07 LAB — CK TOTAL AND CKMB (NOT AT ARMC): Total CK: 363 U/L — ABNORMAL HIGH (ref 7–232)

## 2011-04-07 LAB — CBC
Hemoglobin: 11.2 g/dL — ABNORMAL LOW (ref 13.0–17.0)
MCHC: 33.6 g/dL (ref 30.0–36.0)
RBC: 3.43 MIL/uL — ABNORMAL LOW (ref 4.22–5.81)
WBC: 8.9 10*3/uL (ref 4.0–10.5)

## 2011-04-08 ENCOUNTER — Observation Stay (HOSPITAL_COMMUNITY): Payer: Medicare Other

## 2011-04-08 ENCOUNTER — Ambulatory Visit: Payer: Medicare Other | Admitting: Internal Medicine

## 2011-04-08 LAB — CBC
MCH: 32 pg (ref 26.0–34.0)
MCV: 96.1 fL (ref 78.0–100.0)
Platelets: 326 10*3/uL (ref 150–400)
RDW: 13.5 % (ref 11.5–15.5)

## 2011-04-08 LAB — DIFFERENTIAL
Eosinophils Absolute: 0.2 10*3/uL (ref 0.0–0.7)
Eosinophils Relative: 2 % (ref 0–5)
Lymphs Abs: 1.9 10*3/uL (ref 0.7–4.0)
Monocytes Absolute: 0.9 10*3/uL (ref 0.1–1.0)
Monocytes Relative: 10 % (ref 3–12)

## 2011-04-08 LAB — TSH: TSH: 1.646 u[IU]/mL (ref 0.350–4.500)

## 2011-04-08 LAB — PROTIME-INR: Prothrombin Time: 23.1 seconds — ABNORMAL HIGH (ref 11.6–15.2)

## 2011-04-08 LAB — HEPATIC FUNCTION PANEL
Bilirubin, Direct: 0.1 mg/dL (ref 0.0–0.3)
Indirect Bilirubin: 0.3 mg/dL (ref 0.3–0.9)

## 2011-04-08 LAB — CARBOXYHEMOGLOBIN: Total hemoglobin: 11.4 g/dL — ABNORMAL LOW (ref 13.5–18.0)

## 2011-04-09 LAB — HEPARIN LEVEL (UNFRACTIONATED): Heparin Unfractionated: 0.21 IU/mL — ABNORMAL LOW (ref 0.30–0.70)

## 2011-04-09 LAB — CBC
Hemoglobin: 12.8 g/dL — ABNORMAL LOW (ref 13.0–17.0)
MCH: 32.8 pg (ref 26.0–34.0)
RBC: 3.9 MIL/uL — ABNORMAL LOW (ref 4.22–5.81)
WBC: 11.6 10*3/uL — ABNORMAL HIGH (ref 4.0–10.5)

## 2011-04-09 LAB — GIARDIA/CRYPTOSPORIDIUM SCREEN(EIA)
Cryptosporidium Screen (EIA): NEGATIVE
Giardia Screen - EIA: NEGATIVE

## 2011-04-09 LAB — BASIC METABOLIC PANEL
BUN: 20 mg/dL (ref 6–23)
Chloride: 103 mEq/L (ref 96–112)
Potassium: 4.3 mEq/L (ref 3.5–5.1)
Sodium: 141 mEq/L (ref 135–145)

## 2011-04-09 LAB — CLOSTRIDIUM DIFFICILE BY PCR: Toxigenic C. Difficile by PCR: NEGATIVE

## 2011-04-10 ENCOUNTER — Ambulatory Visit: Payer: Medicare Other | Admitting: Physician Assistant

## 2011-04-10 ENCOUNTER — Other Ambulatory Visit (HOSPITAL_COMMUNITY): Payer: Medicare Other | Admitting: Radiology

## 2011-04-11 ENCOUNTER — Telehealth: Payer: Self-pay | Admitting: *Deleted

## 2011-04-11 NOTE — Telephone Encounter (Signed)
HEATHER SCHUB ALREADY S/W PT AND SAID HE WAS IN THE HOSP AND HAD ECHO DONE THERE. Danielle Rankin

## 2011-04-12 LAB — STOOL CULTURE

## 2011-04-16 NOTE — Assessment & Plan Note (Signed)
[facility]                            CARDIOLOGY OFFICE NOTE   ANKUSH, GINTZ                      MRN:          [id number]  DATE:03/18/2008                            DOB:          [birth year]    PRIMARY CARE PHYSICIAN:  Dr. Wylene Simmer.   HISTORY:  [name] is a delightful 77 year old male with a history  congestive heart failure due to ischemic cardiomyopathy.  He has a  totally occluded LAD on catheterization with mild nonobstructive disease  elsewhere.  EF is 25%.  He is status post BiV ICD.  He also has a  history of atrial fibrillation.  He had recurrent atrial fibrillation  recently, and he underwent cardioversion.  We increased his amiodarone  from 200-400 for a little while, but he was unable to tolerate higher  dose due to jitteriness.   He returns today for routine follow-up.  He says he is doing quite well  and just got back from the beach.  He denies any chest pain or shortness  of breath.  His lower extremity edema has resolved.   CURRENT MEDICATIONS:  1. Lisinopril 5 mg a day.  2. Amiodarone 200 day.  3. Zyrtec 10 a day.  4. Aspirin 81 a day.  5. Potassium 20 a day.  6. Omeprazole 20 a day.  7. Flomax 0.8 a day.  8. Coumadin.  9. Mirtazapine.  10.Melatonin.  11.Also vitamin D.   On physical exam, he is elderly frail male with severe kyphosis.  He  ambulates slowly with a walker.  No respiratory distress.  Blood  pressure is 126/70, heart rate 65.  Weight is 162 which is stable.  HEENT:  Is normal.  NECK:  Is supple with severe kyphosis.  No JVD.  Carotid 2+ bilaterally  with bilateral bruits.  There is no lymphadenopathy or thyromegaly.  CARDIAC:  PMI is mildly displaced.  He is regular with a 2/6 mitral  regurgitation murmur at the apex.  No S3.  LUNGS:  Clear.  ABDOMEN:  Soft, nontender, nondistended.  No hepatosplenomegaly, no  bruits, no masses.  EXTREMITIES:  Warm with no cyanosis, clubbing, or edema.  NEUROLOGICAL:  Alert and oriented x3.  Cranial nerves II-XII are grossly  intact.  Moves all 4 extremities without difficulty.  Affect is very  pleasant.   Interrogation of his device shows that he is maintaining sinus rhythm.  His OptiVol index looks good.  EKG shows AV pacing.   ASSESSMENT/PLAN:  1. Congestive heart failure secondary to ischemic cardiomyopathy.  He      is doing quite well.  No evidence of ischemia.  Continue current      therapy.  Volume status looks good.  He is unable to high tolerate      higher doses of medications due to extreme symptoms.  He was      totally intolerant of beta blockers.  2. Atrial fibrillation.  He is maintaining sinus rhythm.  Continue      amiodarone and Coumadin.  He will need thyroid panel in the near      future.  3. Hyperlipidemia.  Check lipids next month when I see him back.   DISPOSITION:  Will see him back in 1-2 months.     Bevelyn Buckles. Bensimhon, MD  Electronically Signed    DRB/MedQ  DD: 03/18/2008  DT: 03/18/2008  Job #: [id number]   cc:   Gaspar Garbe, M.D.

## 2011-04-16 NOTE — Assessment & Plan Note (Signed)
[facility]                         ELECTROPHYSIOLOGY OFFICE NOTE   WILLIARD, KELLER                      MRN:          [id number]  DATE:12/17/2007                            DOB:          May 30, 1924    Mr. Caraway returns today for follow-up.  He is a very pleasant elderly  man with a history of an ischemic cardiomyopathy, congestive heart  failure, complete heart block, severe LV dysfunction, with EF of 30%,  and with all the above is status post biventricular ICD insertion.  The  patient returns for follow-up.  His implant was carried out back in  October 2007, but in the last several months he has had intermittent  episodes of diaphragmatic stimulation.  Today, this was reproducible,  whereas at another times it had not been.  The patient otherwise has  been stable.  His heart failure symptoms have been class II.  He is  chronically ill and elderly and has had some slowing down in his overall  function, but otherwise was without complaint.   MEDICATIONS:  1. Aspirin 81 mg daily.  2. Flomax 0.4 daily.  3. Potassium 20 mEq daily.  4. Amiodarone 200 a day.  5. Coumadin as directed.  6. Lasix 40 a day.  7. Lisinopril 5 mg 1/2 tablet daily.  8. He is also on Zyrtec.   PHYSICAL EXAMINATION:  GENERAL:  Notable for him being a pleasant  elderly man, chronically ill appearing, but in no acute distress.  VITAL SIGNS:  The blood pressure was 116/67m the pulse was 62 and  regular, his respirations were 18, the weight was 165 pounds.  NECK:  Revealed no jugular venous distention.  LUNGS:  Clear bilaterally to auscultation.  No wheezes, rales, or  rhonchi are present.  CARDIOVASCULAR:  Revealed a regular rate and rhythm, with normal S1 and  S2.  There is a soft systolic murmur at the right upper sternal border.  The PMI was laterally displaced.  EXTREMITIES:  Demonstrated trace peripheral edema bilaterally.  There  was no cyanosis or clubbing  noted.   Interrogation of his defibrillator demonstrates a Medtronic Lebo.  The The fibrillation waves were 1.3, the R-waves were 5.5.  The pacing  impedance was 448 and 472 in the A and RV, and 448 in the LV.  Threshold  of 1 V at 0.4 in the RV, 2.2 at 0.7 in the LV.  Today, we reprogrammed  his device from tip to coil pacing, which was at 2.5 at 0.7.  Otherwise,  there were no significant changes made.   IMPRESSION:  1. Ischemic cardiomyopathy.  2. Congestive heart failure.  3. Complete heart block.  4. Status post biventricular implantable cardioverter defibrillator      insertion.   DISCUSSION:  Overall, Mr. Loeffelholz is stable.  We will see him back in the  office in several months.     Doylene Canning. Ladona Ridgel, MD  Electronically Signed    GWT/MedQ  DD: 12/17/2007  DT: 12/18/2007  Job #: [id number]

## 2011-04-16 NOTE — Assessment & Plan Note (Signed)
[facility]                            CARDIOLOGY OFFICE NOTE   [name], [name]                      MRN:          [id number]  DATE:05/23/2008                            DOB:          10/01/1924    PRIMARY CARE PHYSICIAN:  Gaspar Garbe, MD   INTERVAL HISTORY:  [name] is a delightful 77 year old male with  history of congestive heart failure due to ischemic cardiomyopathy.  He  has a totally occluded LAD on catheterization with mild nonobstructive  disease elsewhere.  EF has been 25-30% range.  He is status post BiV  ICD.  He also has problems with atrial fibrillation and has undergone  several cardioversions.  He is maintained on amiodarone.  He has been  unable to tolerate higher dose of amiodarone due to jitteriness.   We last saw him about 2 months ago at which time he was having problems  with weakness and jitteriness in the morning.  I suspect that this may  be due to hypoglycemia.  He did have someone check his sugars with a  glucose monitor, and they were in the 70s in the morning.  He felt  better after ate.  He still feels alert though jittery in the morning,  but this has somewhat improved.  He says once he gets to the afternoon,  he feels much better.  He has had mild lower extremity edema but this is  taking care well with Lasix.  He denies any chest pain.  No orthopnea.  No PND.  He denies any palpitations.   CURRENT MEDICATIONS:  1. Lisinopril 5 mg a day.  2. Amiodarone 200 a day.  3. Zyrtec 10 mg a day.  4. Aspirin 81.  5. Potassium 20 mEq a day.  6. Omeprazole 20 a day.  7. Flomax 0.8 a day.  8. Coumadin.  9. Mirtazapine.  10.Melatonin.  11.Lasix 40 a day.   ALLERGIES:  He is intolerant of any BETA-BLOCKERS due to severe fatigue  and has not been tolerant of higher doses of ACE INHIBITOR due to  hypotension.   PHYSICAL EXAMINATION:  GENERAL:  He is an elderly frail male who has  severe kyphosis, ambulates  around the clinic slowly without any  respiratory difficulty.  VITAL SIGNS:  Blood pressure is 120/58, heart rate 61, and weight is  164.  HEENT:  Normal.  NECK:  Supple with severe kyphosis.  No JVD.  Carotids are 2+  bilaterally without any bruits.  There is no lymphadenopathy or  thyromegaly.  CARDIAC:  PMI is mildly displaced.  He has regular 2/6 mitral  regurgitation murmur at the apex.  No S3.  LUNGS:  Clear.  ABDOMEN:  Soft, nontender, and nondistended.  No hepatosplenomegaly.  No  bruits.  No masses.  EXTREMITIES:  Warm with no cyanosis, clubbing, or edema.  NEUROLOGIC:  Alert and oriented x3.  Cranial nerves II through XII are  grossly intact.  Moves all 4 extremities without difficulty.  Affect is  pleasant.   His EKG shows AV pacing at a rate of 60.  Optimal fluid  index is  suggestive of good volume control.   ASSESSMENT:  1. Congestive heart failure secondary to ischemic cardiomyopathy.  He      is doing quite well.  Volume status looks good.  Continue current      therapy.  Unfortunately, he is unable to tolerate higher doses of      angiotensin-converting enzyme inhibitors or beta-blockers due to      side effects.  2. Atrial fibrillation.  He is maintaining sinus rhythm.  Continue      current therapy.   DISPOSITION:  He is doing well.  We will see him back in several months  for routine followup.     Andre Buckles. Bensimhon, MD  Electronically Signed    DRB/MedQ  DD: 05/23/2008  DT: 05/24/2008  Job #: [id number]   cc:   Gaspar Garbe, M.D.

## 2011-04-16 NOTE — Assessment & Plan Note (Signed)
[facility]                            CARDIOLOGY OFFICE NOTE   [name], [name]                      MRN:          [id number]  DATE:04/15/2007                            DOB:          [birth year]    PRIMARY CARE PHYSICIAN:  Dr. Adelfa Koh. Tisovec.   INTERVAL HISTORY:  [name] is a delightful 77 year old male with a  history of congestive heart failure with mixed cardiomyopathy and an EF  of approximately 35%, who returns today for routine followup.  Also has  a history of severe mitral regurgitation, as well as coronary artery  disease, hypertension, hyperlipidemia, and atrial fibrillation, which is  maintained in sinus rhythm on amiodarone.  He is status post BiV pacer.   He returns today with his family for routine followup.  He is doing  fantastic.  Although he has to use a walker, he is able to do all of his  activities without any significant dyspnea.  He has no chest pain and no  lower extremity edema, no orthopnea.  No PND.  He is having some  skipping sensation in his left lower quadrant, which he says is sort of  intermittent.  He is not sure if it is gas or something else.  It seems  to be worse when he lays on his side.   CURRENT MEDICATIONS:  1. Aspirin 81.  2. Prilosec 20.  3. Lisinopril 5 mg a day.  4. Claritin 10 mg a day.  5. Flomax 0.4 b.i.d.  6. Potassium 20 a day.  7. Amiodarone 200 a day.  8. Coumadin.  9. Lasix 40 a day.   ALLERGIES/INTOLERANCES:  PENICILLIN.  He is also very intolerant to BETA  BLOCKERS due to weakness and fatigue.   PHYSICAL EXAM:  He is an elderly male in no acute distress.  He  ambulates around the clinic slowly with a walker.  No respiratory  distress.  He has severe kyphosis.  Blood pressure is 106/64 with a heart rate of 70.  HEENT:  Sclerae normal.  NECK:  Supple.  No JVD.  Carotids are 2+ bilaterally without bruits.  There is no lymphadenopathy or thyromegaly.  CARDIAC:  PMI is  normal.  Regular rate and rhythm with a 2/6 mitral  regurgitation murmur at the apex.  No S3.  LUNGS:  Clear.  There is no evidence of diaphragmatic pacing.  ABDOMEN:  Soft, nontender, nondistended.  There is no  hepatosplenomegaly.  No bruits.  No masses.  Good bowel sounds.  EXTREMITIES:  Warm with no cyanosis, clubbing, or edema.  No rash.  NEURO:  He is alert and oriented x3.  Cranial nerves 2-12 are intact.  Moves all 4 extremities without difficulty.  Affect is pleasant.   ASSESSMENT AND PLAN:  1. Congestive heart failure.  [name] is doing remarkably well.  He      is status post biventricular pacer.  I put him in [place] [facility]      Association class II.  He is unfortunately intolerant of beta      blockers.  We, I think,  eventually need to try and titrate his ACE      inhibitor up, but he is doing so well I think it is reasonable to      leave him where he is at for now.  2. Hyperlipidemia.  Followed by Dr. Wylene Simmer.  Given his coronary      artery disease, would suggest getting his LDL to 70 or below.  3. Atrial fibrillation.  Continue Coumadin and amiodarone.  4. Abdominal skipping.  I wonder if this is diaphragmatic pacing.  We      will have the electrophysiology nurses interrogate.   DISPOSITION:  Return to clinic in 3 months for routine followup.     Bevelyn Buckles. Bensimhon, MD     DRB/MedQ  DD: 04/15/2007  DT: 04/15/2007  Job #: [id number]   cc:   Gaspar Garbe, M.D.

## 2011-04-16 NOTE — Assessment & Plan Note (Signed)
[facility]                                 ON-CALL NOTE   TOMISLAV, MICALE                      MRN:          [id number]  DATE:10/14/2009                            DOB:          05-07-1924    PRIMARY CARDIOLOGIST:  Bevelyn Buckles. Bensimhon, MD   PROBLEM:  Mr. Charter contacted me complaining of some congestion in  his nose.  He states that his family is hesitant to have him take any  medications for this, without checking with our group first.  Of note,  however, he denies any chest pain, shortness of breath, orthopnea, PND,  lower extremity edema, or tachy palpitations.   PLAN:  I advised Mr. Wigington to continue watchful waiting.  If his  symptoms exacerbate, or he develops fever, then he is to contact his  primary care physician on Monday.  He was in agreement with this  recommendation, and appreciated the call.     Gene Serpe, PA-C  Electronically Signed    GS/MedQ  DD: 10/14/2009  DT: 10/15/2009  Job #: [id number]

## 2011-04-16 NOTE — Assessment & Plan Note (Signed)
[facility]                            CARDIOLOGY OFFICE NOTE   [name], [name]                      MRN:          [id number]  DATE:09/07/2008                            DOB:          [birth year]    PRIMARY CARDIOLOGIST:  Bevelyn Buckles. Bensimhon, MD   ELECTROPHYSIOLOGIST:  Doylene Canning. Ladona Ridgel, MD   PRIMARY CARE:  Gaspar Garbe, MD   This is an 77 year old married white male patient with a history of  ischemic cardiomyopathy, ejection fraction 25-30%.  He also has an AICD  and has been undergone several cardioversions for atrial fibrillation in  the last being in March of this year.  He is currently maintained on  amiodarone.  He has been unable to tolerate higher doses of amiodarone  due to jitteriness.  He  last saw Dr. Gala Romney in June of this year.  He is brought in today by his daughter and wife, both who say his  shaking and tremors have become significantly worse.  He cannot hold any  type of cup without spilling the contents.  His most trouble is in the  morning where it has gotten to point where he will not go out anywhere.  He had terrible time yesterday morning with the shaking that lasted  longer than usual.   He also has been coughing up some blood-tinged sputum, although he says  he has had some sinus problems.  He did address this with Corrie Dandy in the  [facility] yesterday who has, in fact, adjusted his Coumadin.  His  INR was 2.6 yesterday according to the patient.   CURRENT MEDICATIONS:  1. Lisinopril 10 mg one half daily.  2. Amiodarone 200 mg daily.  3. Zyrtec 10 mg daily.  4. Aspirin 81 mg daily.  5. Ocuvite 1 daily.  6. Potassium 20 mEq daily.  7. Omeprazole 20 mg daily.  8. Flomax 0.4 two daily.  9. Warfarin as directed.  10.Mirtazapine 15 mg one half nightly.  11.Vitamin D weekly.  12.Melatonin 3 mg nightly.  13.Testosterone 100 mg 2 weeks.  14.Furosemide 40 mg daily.   PHYSICAL EXAMINATION:  GENERAL:  This is  an elderly 77 year old white  male in no acute distress.  VITAL SIGNS:  Blood pressure 116/71, pulse 61, weight 166.  NECK:  Without JVD, HJR, bruit, or thyroid enlargement.  LUNGS:  Clear anterior, posterior, and lateral.  HEART:  Regular rate and rhythm at 61 beats per minute.  Normal S1 and  S2 with 3/6 harsh systolic murmur at the apex and left ventricular lift.  ABDOMEN:  Soft without organomegaly, masses, lesions, or abnormal  tenderness.  EXTREMITIES:  Trace of edema in the ankles bilaterally, right greater  than left.  NEUROLOGIC:  It is little bit shaky when he holds his handout, but is  doing a little bit better today.   EKG, ventricular paced.  His pacemaker was checked today by Renae Fickle and he  is in atrial fibrillation less than 0.1% in the time.   IMPRESSION:  1. Tremors and shakiness questioned secondary to amiodarone.  2. Ischemic cardiomyopathy, ejection fraction  of 25-30%, currently      compensated.  3. Coronary artery disease with total left anterior descending and      mild nonobstructive disease elsewhere on catheterization in 2006.  4. Status post automatic implantable cardioverter-defibrillator in      2006.  5. Paroxysmal atrial fibrillation status post multiple cardioversions,      currently maintained on amiodarone.  6. Hypertension.  7. Hyperlipidemia.  8. Severe kyphosis.  9. Chronic Coumadin therapy, currently with some blood-tinged sputum      and INR of 2.6.  10.History of extreme intolerance to beta-blockers.  11.History of anemia secondary to gastrointestinal bleed.   PLAN:  I have discussed this patient with Dr. Ladona Ridgel who agrees in  stopping his amiodarone.  If he continues to have the tremors and  jitteriness after 2 months off amio, he will need to see a neurologist,  and we will put him back on the amiodarone.  If he has problems with  recurrent atrial fibrillation off the amiodarone, he needs to call and  schedule an appointment with Dr.  Ladona Ridgel and may need to be started on  another agent.  He will see Dr. Ladona Ridgel back in January.  Instructions on  his Coumadin have been given to him by Shelby Dubin in the Coumadin  Clinic, and he will see Dr. Gala Romney back as regularly scheduled.      Jacolyn Reedy, PA-C  Electronically Signed      Doylene Canning. Ladona Ridgel, MD  Electronically Signed   ML/MedQ  DD: 09/07/2008  DT: 09/07/2008  Job #: [id number]

## 2011-04-16 NOTE — Assessment & Plan Note (Signed)
[facility]                            CARDIOLOGY OFFICE NOTE   [name], [name]                      MRN:          [id number]  DATE:10/18/2008                            DOB:          Jan 15, 1924    PRIMARY CARE PHYSICIAN:  Gaspar Garbe, MD   INTERVAL HISTORY:  [name] is a joyful 77 year old male with history  congestive heart failure secondary to ischemic cardiomyopathy.  He has a  totally occluded LAD on cardiac catheterization with nonobstructive  disease elsewhere.  EF was in 25-30% range.  He is status post BiV ICD.  He also has problems of atrial fibrillation, has undergone several  cardioversions.  He is currently being weaned on amiodarone.   Since I last saw him his amiodarone was stopped briefly by Wende Bushy and Dr. Ladona Ridgel due to increased tremors.  We subsequently put him  back on 100 mg a day and he has been tolerating this well.  He does not  tolerate atrial fibrillation.  Unfortunately, he has also had a  significant problems with his eyes.  He had a cataract surgery and it  seems like the lens fell back into his eye and significant bleeding.  He  has now been off his Coumadin for some time.  He is having problems with  his vision.  He is going to repeat surgery in a couple of weeks at  [facility].   From heart failure point of view, he is doing well.  He denies any  orthopnea.  No PND.  No lower extremity edema.  No weight gain.  Denies  any palpitations.   CURRENT MEDICATIONS:  1. Amiodarone 100 mg a day.  2. Lisinopril 5 mg a day.  3. Zyrtec 10 mg a day.  4. Aspirin 81 a day.  5. Potassium 20 mEq a day.  6. Omeprazole 20 a day.  7. Flomax 0.8 mg a day.  8. Coumadin, which is on hold.  9. Mirtazapine.  10.Lasix 40 a day.  11.Testosterone shots.   PHYSICAL EXAMINATION:  GENERAL:  He is elderly male with no acute  distress.  He has severe scoliosis.  VITAL SIGNS:  Blood pressure is 112/64 and heart rate  63.  HEENT:  Normal except for significant bruising around the right eye that  was appears to be a subconjunctival hemorrhage.  NECK:  Supple.  No obvious JVD.  Carotids are 2+ bilaterally without  bruits.  There is no lymphadenopathy or thyromegaly.  CARDIAC:  PMI is  nondisplaced.  He is regular rate and rhythm.  No murmurs with 2/6  mitral regurgitation murmur at the apex.  No S3.  LUNGS:  Clear.  ABDOMEN:  Soft, nontender, and nondistended.  No hepatosplenomegaly, no  bruits, no masses.  Good bowel sounds.  EXTREMITIES:  Warm with no  cyanosis, clubbing, or edema.  NEUROLOGY:  Alert and oriented x3.  Cranial nerves II-XII are intact.  He moves all 4 extremities without difficulty.  Affect is pleasant.   EKG shows AV pacing at a rate of 63.   ASSESSMENT AND PLAN:  1.  Atrial fibrillation.  He is maintaining sinus rhythm on amiodarone.      We have reduced the dose, this is helped his tremor.  We will      continue current dosing.  2. Congestive heart failure secondary to ischemic cardiomyopathy.      This is stable.  His New York Heart Association class III.  3. Coronary artery disease, stable.  No evidence of ischemia.  4. Ophthalmologic issues.  He is holding his Coumadin due to his      upcoming surgery and recent retinal bleed.  I think this is quite      reasonable if he needs to stop his aspirin, this would be okay as      well.   DISPOSITION:  We will see him back in a couple months to see how he is  doing.     Bevelyn Buckles. Bensimhon, MD  Electronically Signed    DRB/MedQ  DD: 10/18/2008  DT: 10/19/2008  Job #: [id number]   cc:   Gaspar Garbe, M.D.

## 2011-04-16 NOTE — Assessment & Plan Note (Signed)
[facility]                         GASTROENTEROLOGY OFFICE NOTE   DAVI, KROON                      MRN:          [id number]  DATE:06/12/2007                            DOB:          08/29/1924    OFFICE CONSULTATION NOTE.   Patient self-referred.   REASON FOR CONSULTATION:  Gas.   HISTORY:  This is an 77 year old white male with a history of history of  congestive heart failure, mixed cardiomyopathy, hypertension,  hyperlipidemia, atrial fibrillation, acute gastrointestinal bleeding of  uncertain cause (despite extensive workup) without recurrence,  adenomatous colon polyps, marked diverticulosis, and gastroesophageal  reflux disease complicated by erosive esophagitis and peptic stricture.  He last underwent upper endoscopy July 02, 2005, and was found to have  a distal esophageal stricture and a 4 cm hiatal hernia.  No other  abnormalities.  His last colonoscopy was performed July 17, 2004, for  polyp surveillance.  At that time he was found to have a diminutive  sigmoid colon polyp and marked left-sided diverticulosis as well as  hemorrhoids.  Since I last saw the patient he has had significant  cardiovascular problems.  He has a history of complete heart block for  which he has had pacemaker placed.  Also has a history of atrial  arrhythmia.  He presents today for a 90-month history of what he  describes as gas or air in the left mid abdomen.  He notices this  particularly at night and can sometimes make it difficult to him to  sleep.  There is no pain associated with this, it does seem to improve  when he passes flatus.  He is on no new medications over the past 6  months.  He denies nausea, vomiting, abdominal pain, melena,  hematochezia, constipation or diarrhea.  His diet is unchanged.   ALLERGIES:  PENICILLIN.   CURRENT MEDICATIONS:  1. Aspirin 81 mg daily.  2. Ocuvite.  3. Prilosec 20 mg daily.  4. Lisinopril 10 mg  daily.  5. Claritin 10 mg daily.  6. Flomax 0.4 mg b.i.d.  7. K-Dur 20 mEq daily.  8. Amiodarone 200 mg daily.  9. Coumadin as directed.  10.Lasix.   PHYSICAL EXAM:  A well-appearing male in no acute distress.  Blood  pressure is 116/60, heart rate 76, weight is 163.6 pounds.  HEENT:  Sclera are anicteric.  Conjunctiva are pink.  Oral mucosa  intact, no adenopathy.  LUNGS:  Clear.  HEART:  Regular.  There is a systolic murmur at the apex.  LUNGS:  Clear.  ABDOMEN:  Soft, nontender, nondistended with good bowel sounds.  No  organomegaly, masses or hernia.  EXTREMITIES:  Without edema.   IMPRESSION:  Vague left-sided gas with mild cramping sensation.  This is  not worrisome by history.  Prior colonoscopy and endoscopy as described  above.  I suspect he may be experiencing nonspecific increased  intestinal gas or possibly some spasm related to his diverticular  disease.   RECOMMENDATIONS:  1. Reassurance.  2. Probiotic Align one p.o. daily for 2 weeks.  3. Bentyl 10 mg at night p.r.n. symptoms.  4. GI followup p.r.n.     Wilhemina Bonito. Marina Goodell, MD  Electronically Signed    JNP/MedQ  DD: 06/12/2007  DT: 06/14/2007  Job #: [id number]   cc:   Gaspar Garbe, M.D.  Bevelyn Buckles. Bensimhon, MD

## 2011-04-16 NOTE — Assessment & Plan Note (Signed)
[facility]                            CARDIOLOGY OFFICE NOTE   MALICK, NETZ                      MRN:          [id number]  DATE:07/20/2007                            DOB:          November 15, 1924    PRIMARY CARE PHYSICIAN:  Gaspar Garbe, M.D.   INTERVAL HISTORY:  [name] is a delightful 77 year old male with a  history of congestive heart failure with a mixed cardiomyopathy and an  ejection fraction of approximately 35%, who returns today for routine  followup.  The remainder of the medical history is notable for mitral  regurgitation and coronary artery disease, hypertension, hyperlipidemia  and atrial fibrillation which is maintained in sinus rhythm on  amiodarone.  He is status post Bi-V pacer.   He returns today with his family for routine followup.  He continues to  do very well, although he does use a walker to do many of his  activities.  He gets around without any significant dyspnea.  He has not  had any chest pain or lower extremity edema.  He denies any palpitations  or presyncope.   He continues to have some funny pulsations on the left side of his  chest.  We evaluated for diaphragmatic stimulation due to his pacemaker  but there was no evidence of this.  He recently saw Dr. Marina Goodell in GI who  thought it might be a little bit of dyspepsia but didn't think it was  anything to worry about.   CURRENT MEDICATIONS:  1. Aspirin 81.  2. Lisinopril 5 daily.  3. Claritin 10 daily.  4. Flomax 0.4 b.i.d.  5. Potassium 20 mEq daily.  6. Amiodarone 200 daily.  7. Coumadin as directed.  8. Lasix 40 daily.  9. Prilosec 20 daily.   ALLERGIES/INTOLERANCES:  PENICILLIN.  He is also VERY INTOLERANT TO BETA  BLOCKERS due to severe weakness and fatigue.  We have tried these on  several occasions.   PHYSICAL EXAMINATION:  GENERAL APPEARANCE:  He is an elderly man in no  acute distress.  He ambulates around the clinic slowly with a  walker.  There is no respiratory distress.  He does have severe kyphosis.  VITAL SIGNS:  Blood pressure 110/64, heart rate 69.  HEENT:  Normal.  NECK:  Supple with no JVD, carotid's are 2+ bilaterally without bruits.  There is no lymphadenopathy or thyromegaly.  CARDIAC:  PMI is normal.  He has a regular rate and rhythm with a 2/6  mitral regurgitation murmur at the apex.  There is no S3.  LUNGS:  Clear.  ABDOMEN:  Soft, nontender, nondistended.  There is no  hepatosplenomegaly.  No bruits.  No masses.  Good bowel sounds.  EXTREMITIES:  Warm with no cyanosis, clubbing or edema.  No rash.  NEUROLOGICAL:  He is alert and oriented x3.  Cranial nerves II-XII are  intact.  He moves all four extremities without difficulty.  Affect is  pleasant.   ASSESSMENT/PLAN:  1. Congestive heart failure.  Mr. Sanluis is doing remarkably well.  I      would put him at  NYHA class 2.  He is euvolemic.  We will go ahead      and increase his lisinopril to 10 mg a day and see how he tolerates      this.  He is also due for a repeat echocardiogram as he has not had      this for over a year.  2. Atrial fibrillation.  He is maintaining sinus rhythm on amiodarone.      He is also on Coumadin.  We will check amiodarone surveillance labs      including a thyroid panel and liver panel.   DISPOSITION:  Return to clinic in three to four months for routine  followup.     Bevelyn Buckles. Bensimhon, MD  Electronically Signed    DRB/MedQ  DD: 07/20/2007  DT: 07/21/2007  Job #: [id number]   cc:   Gaspar Garbe, M.D.

## 2011-04-16 NOTE — Assessment & Plan Note (Signed)
[facility]                                 ON-CALL NOTE   JOSEY, FORCIER                      MRN:          [id number]  DATE:11/12/2008                            DOB:          Sep 22, 1924    I received a call from Jossie Ng, the daughter of the patient Rylee Nuzum.  Mr. Kijowski is a patient of Dr. Gala Romney and followed in clinic  secondary to prior history of congestive heart failure.  Mr. Mantz  recently had eye surgery 8 days ago and apparently had a Foley catheter  placed (performed at [facility] in [place]).  Since  his discharge, he has noted reduction in his urine output and has been  dribbling and incontinence at times.  His daughter reports a 4-pound  weight gain over the past week despite remaining on Lasix dosing.  The  patient lives at [facility] and the daughter is asking what to do.  I  recommend that he should likely be seen by someone as a urinalysis to  evaluate for possible UTI in the setting of previous Foley catheter and  now incontinence is warranted as well as a BMET, given some fall off in  his urine output.  With regards to his weight gain, the patient is  asymptomatic.  There is apparently no history of dyspnea or edema.  I  recommended that they have the patient to be seen today whether at an  urgent care or here in the [facility] ED and to call back with any  questions.      Nicolasa Ducking, ANP  Electronically Signed      Bevelyn Buckles. Bensimhon, MD  Electronically Signed   CB/MedQ  DD: 11/12/2008  DT: 11/12/2008  Job #: [id number]

## 2011-04-16 NOTE — Assessment & Plan Note (Signed)
[place] [facility]                            CARDIOLOGY OFFICE NOTE   HELEN, CUFF                      MRN:          [id number]  DATE:10/12/2007                            DOB:          1924/02/08    PRIMARY CARE PHYSICIAN:  Dr. Wylene Simmer.   INTERVAL HISTORY:  Ms. Harriott is an 77 year old male with a history of  congestive heart failure with a mixed cardiomyopathy, ejection fraction  previously about 25%, who presents today for routine followup.  The  remainder of his medical history is notable for coronary artery disease.  He underwent catheterization in 2006 and had a chronic total occlusion  of the distal LAD with mild nonobstructive disease elsewhere,  hypertension, hyperlipidemia, atrial fibrillation, maintained in sinus  rhythm on amiodarone.  He is status post Bi-V pacer.   He returns today with his family for routine followup.  He continues to  do very well.  He uses a walker to do many of his activities.  He gets  around without significant dyspnea.  He has not had any chest pain or  lower extremity edema.  He denies any palpitations or presyncope.  He  did have an echocardiogram a month or 2 ago which showed an ejection  fraction of 20% to 30% with moderate to severe mitral regurgitation.  LV  dimensions were unchanged.   CURRENT MEDICATIONS:  1. Aspirin 81.  2. Lisinopril 5 mg a day.  3. Flomax 0.4 mg b.i.d.  4. Potassium 20 mEq a day.  5. Amiodarone 200 a day.  6. Coumadin.  7. Lasix 40 mg a day.  8. Prilosec 20 mg a day.  9. Zyrtec.  10.Melatonin.   ALLERGIES/INTOLERANCES:  To PENICILLIN and he is also very intolerant to  BETA BLOCKERS due to severe weakness and fatigue; we have tried several  of them on several occasions.   PHYSICAL EXAMINATION:  He is an elderly male in no acute distress.  He  ambulates around the clinic slowly with a walker.  There is no  respiratory distress.  He has severe kyphosis.  VITAL SIGNS:   Blood pressure is 110/62, heart rate 62, weight is 167.  HEENT:  Normal.  NECK:  Supple.  There is no JVD.  Carotids are 2+ bilaterally without  any bruits.  There is no lymphadenopathy or thyromegaly.  CARDIAC:  PMI is normal.  He has a regular rate and rhythm with a 2/6  mitral regurgitation murmur at the apex.  There is no S3.  LUNGS:  Clear.  ABDOMEN:  Soft, nontender and non-distended.  There is no  hepatosplenomegaly, no bruits, no masses. Good bowel sounds.  EXTREMITIES:  Warm with no cyanosis, clubbing or edema.  No rash.  NEUROLOGIC:  He is alert and oriented x3.  Cranial nerves II-XII are  intact.  He moves all 4 extremities without difficulty.  Affect is  pleasant.   ASSESSMENT AND PLAN:  1. Congestive heart failure secondary to systolic dysfunction and a      mixed cardiomyopathy.  He is doing well very well.  I put him in  New York Heart Association class II.  He is euvolemic.  We have      been titrating his lisinopril very slowly, but he has had trouble      tolerating this.  Unfortunately, I do not think we will reach goal      dose.  He is very intolerant of beta blockers.  Continue current      therapy.  2. Atrial fibrillation.  He is maintaining sinus rhythm on amiodarone.      He is on Coumadin.  His thyroid and liver panel are okay.  3. Mitral regurgitation; this is moderate to severe.  Would      discontinue afterload reduction.  He is not a good operative      candidate.  4. Hyperlipidemia.  He follows with Dr. Wylene Simmer.  Goal LDL is less      than 70.   DISPOSITION:  I will see him back in 3-4 months.     Bevelyn Buckles. Bensimhon, MD  Electronically Signed    DRB/MedQ  DD: 10/12/2007  DT: 10/13/2007  Job #: [id number]

## 2011-04-16 NOTE — Assessment & Plan Note (Signed)
[facility]                            CARDIOLOGY OFFICE NOTE   FERLANDO, LIA                      MRN:          [id number]  DATE:04/12/2008                            DOB:          1924-03-18    Mr. Kawabata returns today for further followup.  He has a history of  congestive heart failure secondary to ischemic cardiomyopathy.  He also  has a history of recurrent atrial fibrillation.  Recently, he underwent  direct current cardioversion.  He saw Dr. Gala Romney in the office here  in April at which time his Amiodarone was decreased to 200 mg daily.  The patient returns today for reevaluation.  Mr. Lacomb is accompanied  by his wife and his daughter.  He states he has been doing well except  for in the mornings.  He complains of feeling shaking and jittery when  he first gets up in the morning and this lasts for a few hours.  This  occurs before he takes his morning medicine so he cannot tell any  difference with the medicines in his system.  He states when he gets up  he generally gets showered, dressed, and goes to breakfast.  By the time  he gets to breakfast, he is fatigued.  He denies feeling light-headed or  dizzy.  He just feels shaky and jittery all over.  His wife states it  has got so bad that he does not even want to go to church on Sunday  mornings because of feeling shaky.  It was thought that the Amiodarone  might be contributing to this.  Otherwise, Mr. Hewes denies any  symptoms suggestive of IM overload, denies any episodes of angina,  presyncope, or syncopal episodes.  He denies any problems with his  biventricular pacemaker.   PAST MEDICAL HISTORY:  1. Congestive heart failure secondary to ischemic cardiomyopathy with      an EF of 25%.  2. Coronary artery disease.  3. Totally occluded LAD with mild nonobstructive disease elsewhere.  4. Atrial fibrillation, status post recent cardioversion.  Patient      being maintained on  Amiodarone.  5. Biventricular pacemaker.  6. Hypertension.  7. Hyperlipidemia.  8. Severe kyphosis.  9. Chronic anticoagulation therapy.  10.Extreme intolerance to beta blockers.  11.History of anemia secondary to GI bleed.   REVIEW OF SYSTEMS:  As stated above.   CURRENT MEDICATIONS:  1. Lisinopril 5 mg daily.  2. Amiodarone 200 daily.  3. Zyrtec 10 mg daily.  4. Aspirin 81 mg daily.  5. Ocuvite 1 tablet daily.  6. Potassium 20 mEq daily.  7. Prilosec 20 mg daily.  8. Flomax 0.4 mg, 2 tablets daily.  9. Warfarin as directed.  10.Mirtazapine, patient takes 1/2 tablet at bedtime.  11.Vitamin D weekly.  12.Melatonin 3 mg nightly.  13.Testosterone 100 mg twice a week.  14.Furosemide 40 mg daily.   PHYSICAL EXAMINATION:  Weight 161 pounds, blood pressure 120/65 with a  heart rate of 68.  Treadmill EKG shows an AV pacing at a rate of 61.  Mr. Llanas is no acute distress.  No signs of jugular venous distention at a 45 degree angle.  LUNGS:  Clear to auscultation bilaterally.  CARDIOVASCULAR:  S1 and S2, 2/6 systolic ejection murmur.  ABDOMEN:  Soft, nontender, positive bowel sounds.  LOWER EXTREMITIES:  Without clubbing, cyanosis.  He has +1 edema to his  right ankle.  NEUROLOGIC:  Alert and oriented x3, ambulating with the assistance of a  rolling walker.   IMPRESSION:  1. Congestive heart failure secondary to ischemic cardiomyopathy      without signs of volume overload at this time.  2. Recent cardioversion for atrial fibrillation.  Patient maintaining      sinus rhythm on Amiodarone therapy.  Patient will need thyroid and      lipid panel checked today.  3. Episodes of shaking and jitteriness in the early a.m., not sure of      the etiology, possibly could be low blood sugar.  Have encouraged      patient to eat a light snack before he retires at night and then      have some graham crackers beside his bed in the morning; when he      first wakes up, eat a couple of  crackers and see if that helps with      the feeling.  Doubt change in the time he takes the Amiodarone      would make a difference.  As the half life of his Amiodarone is so      long, the patient could try and take it at bedtime and see if that      does anything different.  The other possibility is decrease the      dose to 100 mg for 3 days and see if he can tell any difference; if      not, need to go back to 200 mg daily.  4. For management of his atrial fibrillation, the patient will get      blood work today and followup with Dr. Gala Romney in 4 to 6 weeks.      Dorian Pod, ACNP  Electronically Signed      Bevelyn Buckles. Bensimhon, MD  Electronically Signed   MB/MedQ  DD: 04/12/2008  DT: 04/12/2008  Job #: [id number]   cc:   Gaspar Garbe, M.D.

## 2011-04-16 NOTE — Op Note (Signed)
NAMECADDEN, [name]               ACCOUNT NO.:  [id number]   MEDICAL RECORD NO.:  [id number]          PATIENT TYPE:  OIB   LOCATION:  2899                         FACILITY:  [facility]   PHYSICIAN:  Bevelyn Buckles. Bensimhon, MDDATE OF BIRTH:  1924-08-15   DATE OF PROCEDURE:  02/05/2008  DATE OF DISCHARGE:                               OPERATIVE REPORT   PROCEDURE:  Cardioversion.   INDICATIONS:  [name] is an 77 year old male with a nonischemic  cardiomyopathy, ejection fraction about 25%. He also  has history of  paroxysmal atrial fibrillation and has been maintained in sinus rhythm  on amiodarone.  However, several months ago he developed increasing  fatigue. He was found to be in atrial fibrillation.  We decided to bring  him in today for cardioversion due to his symptomatic atrial  fibrillation.   After he was appropriately sedated by anesthesia, Dr. Ivin Booty, a 125 mg of  sodium pentothal, he received a single 150 joule, biphasic, synchronized  shock which converted him to sinus rhythm.  He then lost his P waves and  he was AVb paced through his defibrillator.  He will increase his  amiodarone to 200 mg b.i.d. and see him back in the outpatient setting.  Hopefully, he will be able to maintain sinus rhythm.  There were no  apparent complications.      Bevelyn Buckles. Bensimhon, MD  Electronically Signed     DRB/MEDQ  D:  02/05/2008  T:  02/05/2008  Job:  [id number]

## 2011-04-16 NOTE — Assessment & Plan Note (Signed)
[facility]                            CARDIOLOGY OFFICE NOTE   [name], [name]                      MRN:          [id number]  DATE:01/19/2008                            DOB:          [birth year]    PRIMARY CARE PHYSICIAN:  Dr. Wylene Simmer.   HISTORY:  [name] is a delightful 77 year old male with history  congestive heart failure due to ischemic cardiomyopathy.  He is a  totally occluded LAD on catheterization with mild nonobstructive disease  elsewhere.  EF is 25%.   Other medical history is notable for atrial fibrillation previously  maintained in sinus rhythm on amiodarone.  Hypertension, hyperlipidemia.  He is status post BiV pacemaker.   [name] returns today for routine follow-up.  He says for the past  few months, he has felt a bit run down.  This is not like his normal  self.  He gets winded just putting on his clothes in the morning.  He  has not had any orthopnea or PND.  No chest pain and no lower extremity  edema.  He saw Dr. Ladona Ridgel last month and was told that he is back in  atrial fibrillation.  No changes were made at that time.  Current  medications are lisinopril 5 mg a day, aspirin 81, Flomax 0.4 b.i.d.,  potassium 20 a day, amiodarone 200 mg a day, Coumadin, Lasix 40 a day,  Prilosec 20 day, Zyrtec and vitamin D.   PHYSICAL EXAM:  He is an elderly frail male with severe kyphosis.  Ambulates slowly with no respiratory difficulty.  Blood pressure is 130/80, heart rate 64 weights 162.  HEENT is normal.  Neck is supple with severe kyphosis no JVD.  Carotid 2+ bilateral  bruits.  There is no lymphadenopathy or thyromegaly.  CARDIAC:  PMI is mildly displaced.  Regular rate and rhythm with a 2/6  mitral regurgitation murmur at the apex.  No S3.  LUNGS:  Clear.  ABDOMEN:  Soft, nontender, nondistended with no hepatosplenomegaly, no  bruits or masses.  Good bowel sounds.  EXTREMITIES:  Warm with sinus clubbing or edema.  NEURO:  Alert and oriented x3.  Cranial nerves II-XII intact.  Moves all  fours without difficulty.  Affect is pleasant.   EKG shows atrial fibrillation with ventricular pacing at a rate of 61  beats a minute.   ASSESSMENT/PLAN:  [name] has symptomatic atrial fibrillation.  He  has been in this for several months.  We had a long talk with him and  his family and about a rate control versus rhythm control.  Given how  symptomatic he is think it is worry to try another attempt at  cardioversion.  His last cardioversion was almost 2 years ago.  We will  set him up for outpatient cardioversion.  His INR has been of greater  than 2 for many months and I do not think we need a tee beforehand.  I  did tell him that there is a good chance that the cardioversion may not  work or he may revert back to atrial fibrillation quickly.  They are  aware of this and willing to proceed.  From a heart failure point of view he is otherwise doing fairly well.  We will make no changes to his medications at this time.     Bevelyn Buckles. Bensimhon, MD  Electronically Signed    DRB/MedQ  DD: 01/19/2008  DT: 01/20/2008  Job #: [id number]   cc:   Gaspar Garbe, M.D.

## 2011-04-16 NOTE — Assessment & Plan Note (Signed)
[facility]                            CARDIOLOGY OFFICE NOTE   PRINCE, COUEY                      MRN:          [id number]  DATE:12/05/2008                            DOB:          10-09-1924    PRIMARY CARE PHYSICIAN:  Gaspar Garbe, MD   HISTORY:  [name] is a delightful 77 year old male with history  congestive heart failure secondary to ischemic cardiomyopathy.  He has a  totally occluded LAD on cardiac catheterization with nonobstructive  disease elsewhere.  His EF is in the 25-30% range.  She is status post  BiV ICD.  He has also had problems with atrial fibrillation and  undergone several cardioversions.  He is currently maintained on  amiodarone.   Unfortunately, he has had significant problems recently with his eyes.  He underwent an attempted removal of a cataract and this was complicated  by retinal hemorrhage.  He recently underwent repair of this at [facility].  Unfortunately, he had problems with urinary retention postoperatively  and at one point had almost 2 L of urine in his bladder.  He has  subsequently been treating himself with straight catheterization at home  about 4 times a day.  His urine output has ranged between 200 and 600 mL  with these catheterizations.  Fortunately, he is beginning to be able to  urinate on his own.  This has been followed closely by the doctors at  [facility].   From a cardiac point of view, he has remained stable.  He denies any  chest pain or shortness of breath.  He has not had any orthopnea or PND.  His weight has been stable.  He has not had any palpitations.  His  exercise capacity has been maintained .   CURRENT MEDICATIONS:  1. Amiodarone 100 mg a day.  2. Lasix 40 a day.  3. Zyrtec 10 a day.  4. Aspirin 81 a day.  5. Ocuvite.  6. Potassium 20 a day.  7. Lisinopril 5 mg daily.  8. Omeprazole 20 daily.  9. Flomax 0.4 two tablets daily.  10.Coumadin.  11.Mirtazapine.  12.Avodart 5 mg a day.  13.Melatonin.  14.Vitamin D.   ALLERGIES/INTOLERANCES:  He is allergic to PENICILLIN.  He is unable to  tolerate even low-dose BETA-BLOCKERS due to profound fatigue.   PHYSICAL EXAMINATION:  GENERAL:  He is an elderly frail male with severe  kyphosis.  He ambulates slowly with a walker.  No respiratory distress.  VITAL SIGNS:  Blood pressure is 122/65, heart rate 66, weight is 163  which is stable.  HEENT:  Normal.  NECK:  Supple.  There is no JVD.  Carotids are 2+ bilaterally without  any bruits.  There is no lymphadenopathy or thyromegaly.  CARDIAC:  PMI is nondisplaced.  He has regular rate and rhythm with 2/6  mitral regurgitation murmur at the apex.  No S3.  LUNGS:  Clear.  ABDOMEN:  Soft, nontender, and nondistended.  No hepatosplenomegaly.  No  bruits.  No masses.  EXTREMITIES:  Warm with no cyanosis, clubbing, or edema.  NEUROLOGIC:  Alert and  oriented x3.  Cranial nerves are grossly intact.  Moves all 4 extremities without difficulty.  Affect is pleasant.   DIAGNOSTIC STUDIES:  EKG shows sinus rhythm with AV pacing.   His optimal fluid index shows mildly decreased impendence consistent  with mild fluid overload.   ASSESSMENT AND PLAN:  1. Congestive heart failure secondary to ischemic cardiomyopathy.      Clinically, his volume status looks quite good.  His optimal fluid      index is up just a little bit, so I told him to watch his weight      closely and should it increase that he can take extra dose of      Lasix.  Unfortunately, he is unable to tolerate any beta-blockers.      I would like to titrate his ACE inhibitor up gently in the near      future as he tolerates.  2. Atrial fibrillation.  He is maintaining sinus rhythm nicely on      amiodarone.  His dose was decreased due to the tremor, this seems      stable.  He will continue his Coumadin.  3. Coronary artery disease.  This is stable, no evidence of ischemia.      Continue current  therapy.   DISPOSITION:  I will see him back in clinic in a couple months for  followup.     Bevelyn Buckles. Bensimhon, MD  Electronically Signed    DRB/MedQ  DD: 12/05/2008  DT: 12/06/2008  Job #: [id number]   cc:   Gaspar Garbe, M.D.

## 2011-04-17 ENCOUNTER — Other Ambulatory Visit: Payer: Self-pay | Admitting: Pharmacist

## 2011-04-17 MED ORDER — WARFARIN SODIUM 5 MG PO TABS: ORAL_TABLET | ORAL | Status: DC

## 2011-04-18 ENCOUNTER — Ambulatory Visit (INDEPENDENT_AMBULATORY_CARE_PROVIDER_SITE_OTHER): Payer: Medicare Other | Admitting: *Deleted

## 2011-04-18 DIAGNOSIS — I4891 Unspecified atrial fibrillation: Secondary | ICD-10-CM

## 2011-04-18 LAB — POCT INR: INR: 1.6

## 2011-04-19 NOTE — H&P (Signed)
[name], [name]   MEDICAL RECORD NO.:  [id number]           PATIENT TYPE:   LOCATION:                                 FACILITY:   PHYSICIAN:  Arvilla Meres, M.D. [facility]  DATE OF BIRTH:   DATE OF ADMISSION:  04/02/2006  DATE OF DISCHARGE:                                HISTORY & PHYSICAL   PRIMARY CARE PHYSICIAN:  Gaspar Garbe, M.D.   REASON FOR ADMISSION:  Symptomatic atrial fibrillation admitting for Tikosyn  load.   HISTORY OF PRESENT ILLNESS:  [name] is a delightful 77 year old male  with multiple medical problems.  We admitted him in February of this year  with significant congestive heart failure and profound fatigue.  He was  found to be in new onset atrial fibrillation.  Echocardiogram at that time  showed an EF of 35% with mild mitral regurgitation.  He was diuresed and  started on Coumadin.  Several weeks ago we brought him in for elective  cardioversion which was successful.  This was done March 17, 2006.  Unfortunately presents back to the office today feeling totally fatigued  with no energy and markedly short of breath on almost any exertion.  His  wife has done excellent job of monitoring all his intake and his daily  weights and his weight has been rather stable.  He does note that over the  past day or two he has had a little bit of lower extremity edema and some  orthopnea.  Denies any chest pain, denies any palpitations, and does not  have any syncope or presyncope.  Thus, we are admitting him for Tikosyn  load.   REVIEW OF SYSTEMS:  As per HPI and problems otherwise all systems negative.   PROBLEM LIST:  1.  Atrial fibrillation, symptomatic onset February 2007.      1.  Status post direct current cardioversion on March 17, 2006 with          subsequent reversion to atrial fib.  2.  Congestive heart failure secondary to systolic dysfunction in the      setting of atrial fibrillation.      1.   Echocardiogram March 2007, EF 35% with mild mitral regurgitation.  3.  Symptomatic bradycardia and complete heart block status post [employer]tronic      permanent pacemaker.  4.  History of anemia secondary to gastrointestinal bleeds in 2002.  5.  Hypertension.  6.  Hyperlipidemia.  7.  BPH.  8.  Cardiac catheterization on August 13, 2005, with mild nonobstructive      coronary artery disease, ejection fraction of 45%.   CURRENT MEDICATIONS:  1.  Flomax 0.4 b.i.d.  2.  Claritin 10 a day.  3.  Aspirin 81 a day.  4.  Coumadin as directed.  5.  Klonopin 0.5 mg a day.  6.  Prilosec 20 mg a day.  7.  Lasix 40 mg about four times a week.  8.  Potassium 20 mg a day.  9.  Lisinopril 5 mg a day.  10. Risperdal 15 mg q.h.s.  11.  Was previously on the bisoprolol 5 mg a day but this was stopped due to      weakness.   MEDICATION ALLERGIES:  PENICILLIN.   SOCIAL HISTORY:  He lives in [place] with his wife.  He is retired  Engineer, site for [employer]. He is married, has three children.  He has never  smoked or drank any alcohol.   FAMILY HISTORY:  Is significant for his mother died at age 60 due to old  age.  Father died at 52 due to AAA.   PHYSICAL EXAM:  GENERAL:  He is an elderly male who is mildly fatigued, but  otherwise in no acute distress.  VITAL SIGNS:  Respirations are unlabored.  Blood pressure is 140/94, heart  rate is 60 and paced. Weight is 167.  HEENT: Sclerae anicteric.  EOMI.  There is no xanthelasmas.  Mucous  membranes are moist.  NECK:  Neck is supple.  JVP is mildly elevated to 9-10 cm of water with  prominent CV waves.  Carotid 2+ bilaterally without bruits.  There is no  lymphadenopathy or thyromegaly.  CARDIAC:  He has a regular rate and rhythm with a 2/6 holosystolic murmur at  the apex.  There is no S3.  LUNGS:  Lungs are dull at the bases, otherwise clear.  ABDOMEN: Soft, nontender, nondistended, no hepatosplenomegaly, no bruits.  No masses.  EXTREMITIES:   Warm with no cyanosis or clubbing.  There is trace edema at  the ankles.  Distal pulses are 1+ bilaterally.  NEURO: He is alert, oriented x3.  Cranial nerves II-XII intact and moves all  four extremities without difficulty.  Affect is normal.   EKG shows ventricular pacing a rate of 60 with underlying atrial  fibrillation.   ASSESSMENT/PLAN:  1.  Symptomatic atrial fibrillation status post a failed DC cardioversion.      He is quite symptomatic with this with NYHA class 3 to 3B heart failure      symptoms.  He does have mild fluid overload as well.  At this point we      will admit him tomorrow for Tikosyn loading and try to restore normal      rhythm.  We will check his INR today.  He may also need a repeat      cardioversion on Friday.  Hopefully with restoration of normal rhythm he      will have significant improvement.  However, we may also have to      reevaluate his mitral valve.  I suspect some of his symptoms may also be      coming from chronic RV pacing and the setting of a reduced EF. We may      also need to consider upgrading to a biventricular device.  We will see      how he responds to cardioversion and if he is still symptomatic we will      pursue these other options further.  2.  Congestive heart failure.  We will continue his Lasix.  3.  Hypertension. Consider adding low dose Coreg.      Arvilla Meres, M.D. [facility]  Electronically Signed     DB/MEDQ  D:  04/01/2006  T:  04/01/2006  Job:  [id number]

## 2011-04-19 NOTE — Assessment & Plan Note (Signed)
[facility]                            CARDIOLOGY OFFICE NOTE   PACO, CISLO                      MRN:          [id number]  DATE:01/16/2007                            DOB:          04-15-1924    PRIMARY CARE PHYSICIAN:  Dr. Guerry Bruin.   PRIMARY CARDIOLOGIST:  Dr. Arvilla Meres.   HISTORY OF PRESENT ILLNESS:  Mr. Neidert is an 77 year old male patient  followed by Dr. Gala Romney with a history of coronary disease and mixed  cardiomyopathy with an EF of 35%, as well as a history of mitral  regurgitation and atrial fibrillation currently maintained in sinus  rhythm on amiodarone and Coumadin therapy who presents to the office  today for blood work.  He noted to the nurse that he was feeling quite  weak and fatigued and he was added onto my schedule.  When he saw Dr.  Gala Romney last on January 07, 2007 he was noted to be somewhat volume  overloaded.  His Lasix was increased.  Coreg was also added to his  medical regimen.  He called a few days later and complained of some  fatigue.  His Coreg was stopped.  The plan was to eventually get him on  Toprol XL 25 mg a day.  He has not yet started that medication.  He was  due for routine blood work today.  He noted that he continued to feel  weaker and weaker.  He notes shortness of breath with exertion.  He also  notes some increasing pedal edema.  His family is with him today and  they monitor him very well.  He has gained about 3-1/2 pounds by the  scales at home.  He weighs himself daily.  He denies any salt  indiscretion.  Denies any fluid indiscretion.  He has had no cough or  hemoptysis.  Denies chest pain.  Denies syncope.  He does sleep on the  equivalent to 2 pillows but denies any changes in his orthopnea.  Denies  any paroxysmal nocturnal dyspnea.  He says he feels like he did when he  was in atrial fibrillation in the past.  Of note, he was doing  wonderfully when he was last seen  in the office with Dr. Gala Romney.  Since that time, he has felt worse.  Also, he fell a few days ago in the  bathroom.  He lost his balance.  There was no syncope or near syncope.  He fell on his buttocks.  He did not hit his head.   CURRENT MEDICATIONS:  1. Aspirin 81 mg daily.  2. Ocuvite.  3. Prilosec 20 mg daily.  4. Lisinopril 10 mg 1/2 tablet daily.  5. Claritin 10 mg daily.  6. Flomax 0.4 mg b.i.d.  7. K-Dur 20 mEq daily.  8. Amiodarone 200 mg daily.  9. Coumadin as directed.  10.Lasix 40 mg daily.   ALLERGIES:  PENICILLIN.   REVIEW OF SYSTEMS:  Please see HPI.  Denies any fevers, chills, melena,  hematochezia, hematuria, dysuria, nausea, vomiting, diarrhea.  Rest of  review of systems are negative.  His wife does note that he fell about 5  days ago when he was in the bathroom.  The patient notes that he just  lost his balance.  He had no near syncope or syncope with this.  He did  not hit his head.  He did land on his buttocks and has had some pain  over his sacrum and coccyx since then   PHYSICAL EXAMINATION:  He is a well-nourished, well-developed male in no  acute distress.  Blood pressure is 136/82 lying with pulse of 59,  sitting 126/74 with a pulse of 60, standing 125/72 with a pulse of 64,  after 2 minutes 132/82 with pulse 72, after 5 minutes 134/84 with pulse  73.  HEAD:  Normocephalic/atraumatic.  EYES:  PERRLA, extraocular movements intact, sclerae are clear.  NECK:  With JVP about 8-cm.  CARDIAC:  Normal S1, S2, regular rate and rhythm with a 2/6 systolic  ejection murmur best at the apex.  LUNGS:  Clear to auscultation bilaterally.  EXTREMITIES:  With 2+ pedal edema bilaterally.  ABDOMEN:  Soft, nontender.  NEURO:  He is alert and oriented x3, cranial nerves II-XII grossly  intact.  SKIN:  Warm and dry.  MUSCULOSKELETAL:  He has some ecchymosis noted over the sacral region  with no tenderness to palpation.   ECG in the office today reveals AV pacing  with a ventricular rate of 61.   His Medtronic device was interrogated yesterday.  His fluid index is  below the threshold but does have a positive slope.  His activity level  is down over the last several days.  There are no episodes of atrial  fibrillation noted.   IMPRESSION:  1. Fatigue and shortness of breath - suspect acute on chronic systolic      congestive heart failure.  2. Systolic dysfunction with an ejection fraction of 35% in the      setting of chronic atrial fibrillation.  3. Severe mitral regurgitation by echocardiogram June 18, 2006.  4. Coronary disease.      a.     Catheterization September 2006 with chronic total occlusion       of the left anterior descending with right-to-left and left-to-       left collaterals, and minimal amount of obstructive disease in the       circumflex and right coronary artery.  5. History of anemia secondary to GI bleed.  6. History of symptomatic bradycardia.      a.     Status post permanent pacemaker with Medtronic EnPulse dual       chamber pacemaker in 2006 with upgrade to biventricular       implantable cardioverter defibrillator recently.  7. Hypertension.  8. Hyperlipidemia.  9. Amiodarone therapy.  10.Coumadin therapy.  11.Benign prostatic hypertrophy.  12.Paroxysmal atrial fibrillation.   PLAN:  The patient presents to the office today with complaints of a lot  of weakness, fatigue, and shortness of breath with exertion.  Initially  I thought, by his description of his symptoms, that he was back in  atrial fibrillation.  However, he is maintaining sinus rhythm.  His  OptiVol device does show a little bit of an increase in the fluid index  and his activity level is down over the last several days.  Given the  findings on clinical exam and the findings on interrogation of his  device, all signals point towards volume overload.  I discussed this in  detail with Dr. Myrtis Ser, who agrees.  At this point in time we will  increase his Lasix to 40 mg twice a day  for one day only and adjust his potassium as well.  After this he will  go to Lasix 40 mg in the morning and 20 mg in the evening.  He will take  potassium 20 mEq twice a day today and then go to 30 mEq daily tomorrow.  We will get a CMET, CBC, and BNP.   He did suffer a fall recently.  He only has some mild ecchymosis noted  over his sacral region.  If his blood count has dropped significantly  then we will set him up for a CT scan to rule out a bleed.  The last  blood count I have on record for him in the chart is from October of  2007.  His hemoglobin was 14, hematocrit 40.9 at that time.  We will  also check his PT and INR since we are getting blood today to see what  his INR is running.   He knows to watch his salt and water intake.  I will bring him back in  earlier followup next week with Dr. Gala Romney.  If he is worsening  between now and then he knows to call us.  If things are changing  drastically, he knows to go to the emergency room.      Tereso Newcomer, PA-C  Electronically Signed      Luis Abed, MD, [facility]  Electronically Signed   SW/MedQ  DD: 01/16/2007  DT: 01/16/2007  Job #: [id number]   cc:   Gaspar Garbe, M.D.

## 2011-04-19 NOTE — Assessment & Plan Note (Signed)
[facility]                            CARDIOLOGY OFFICE NOTE   [name], [name]                      MRN:          [id number]  DATE:01/30/2007                            DOB:          1924-11-02    PRIMARY CARE PHYSICIAN:  Dr. Guerry Bruin.   INTERVAL HISTORY:  [name] is a very pleasant 77 year old male with a  history of congestive heart failure with a mixed cardiomyopathy and an  EF of 35% who returns today for followup of his congestive heart  failure.  He also has a history of severe mitral regurgitation as well  as coronary artery disease, hypertension, hyperlipidemia, and atrial  fibrillation, and he is maintained in sinus rhythm on amiodarone.  He is  status post Bi-V pacer.   About 2 weeks ago, [name] was seen in the office by Tereso Newcomer.  He was feeling very fatigued  and fluid overloaded.  This was soon after  we started Coreg at 3.125 b.i.d.  He said the Coreg wiped him out and he  could absolutely not tolerate it.  He had no energy.  He was also noted  to be fluid overloaded at that time and his Lasix was increased.  He  returns today.  He is 7 pounds lighter.  He feels much better and is  back to baseline.  He is able to do his activities of daily living  without too much trouble.  He denies any chest, orthopnea, or PND.   MEDICATIONS:  1. Aspirin.  2. Ocuvite.  3. Prilosec 20.  4. Lisinopril 5.  5. Claritin 10.  6. Flomax 0.4 b.i.d.  7. Potassium 20 a day.  8. Amiodarone 200 a day.  9. Coumadin.  10.Lasix 40 a day.   ALLERGIES/INTOLERANCES:  PENICILLIN and BETA BLOCKERS.   PHYSICAL EXAMINATION:  He is an elderly male in no acute distress.  He  ambulates around the clinic slowly with a walker.  No respiratory  distress with that.  Blood pressure 116/78, heart rate 68, weight is  163.  He has severe kyphosis.  HEENT:  Sclerae anicteric.  EOMI.  There are no xanthelasmas.  Mucous  membranes are moist.   Oropharynx is clear.  NECK:  Supple.  JVP is about 9 cm of water.  Carotids are 2+ bilaterally  without any bruits.  There is no lymphadenopathy or thyromegaly.  CARDIAC:  He is regular rate and rhythm, with a 3/6 mitral regurgitation  murmur at the apex.  LUNGS:  Clear.  Decreased air movement.  ABDOMEN:  Soft, nontender, nondistended.  No hepatosplenomegaly, no  bruits, no masses.  EXTREMITIES:  Warm, with no cyanosis, clubbing or edema, no rash.  NEURO:  Alert and oriented x3.  Cranial nerves II-XII intact.  Moves all  4 extremities without difficulty.   ASSESSMENT:  Congestive heart failure.  He is much improved today  although he probably has some mild volume overload.  We will continue  his current therapy.  I told him to continue to watch his weight closely  and increase his Lasix on a sliding scale  as needed.  We had a long  talk, close to 40 minutes, regarding a possible cardiomems implantable  pulmonary artery sensor.  He also spoke with Lucius Conn from the  research division, and he and his family will be considering it.  I  would like to continue to titrate his ACE inhibitors but given his  recent experience with the beta blocker we will give him time to  equilibrate and we will see him back in a few weeks and try again.     Andre Buckles. Bensimhon, MD  Electronically Signed    DRB/MedQ  DD: 01/30/2007  DT: 01/30/2007  Job #: [id number]   cc:   Gaspar Garbe, M.D.

## 2011-04-19 NOTE — Assessment & Plan Note (Signed)
[facility]                            CARDIOLOGY OFFICE NOTE   MITHCELL, SCHUMPERT                      MRN:          [id number]  DATE:02/26/2007                            DOB:          [birth year]    PRIMARY CARE PHYSICIAN:  Gaspar Garbe, M.D.   INTERVAL HISTORY:  [name] is a delightful 77 year old male with a  history of congestive heart failure with a mixed cardiomyopathy and an  EF of 35% who returns today for routine followup. He also has a history  of severe mitral regurgitation as well as coronary disease,  hypertension, hyperlipidemia and atrial fibrillation, which is  maintained in sinus rhythm on amiodarone. He is status post Bi-V pacer.   Mr. Petrasek is doing quite well. He says that this is the best that he  has felt in a long time. He recently went on a trip to the beach with  his family and felt great. He denies any orthopnea, PND. No lower  extremity edema. His energy level is much increased. He denies any  palpitations. He has not had any syncope or pre-syncope and no ICD  firings.   CURRENT MEDICATIONS:  1. Aspirin 81 a day.  2. Prilosec 20 a day.  3. Lisinopril 5 a day.  4. Claritin 10 a day.  5. Flomax 0.4 b.i.d.  6. Potassium 20 a day.  7. Amiodarone 200 daily.  8. Coumadin as directed.  9. Lasix 40 a day.   ALLERGIES/INTOLERANCES:  PENICILLIN. HE IS ALSO VERY INTOLERANCE TO BETA-  BLOCKERS DUE TO WEAKNESS AND FATIGUE.   PHYSICAL EXAMINATION:  He is an elderly male in no acute distress. He  ambulates around the clinic slowly with a walker. No respiratory  distress. He has severe kyphosis. Blood pressure is 119/79 with a heart  rate of 62.  HEENT: Sclerae anicteric. EOMI. There is no xanthelasma. He does have  some scattered skin cancer, some of which have been recently removed.  Mucus membranes are moist. Oropharynx is clear.  NECK: is supple. No JVD. Carotids are 2+ bilaterally without bruits.  There is  no lymphadenopathy, thyromegaly.  CARDIAC: Is a regular rate and a rhythm with a 2/6 mitral regurgitation  murmur at the apex. No S3.  LUNGS:  Are clear.  ABDOMEN: Soft, nontender, nondistended. There is no hepatosplenomegaly.  No bruits. No masses. Good bowel sounds.  EXTREMITIES: Are warm with no cyanosis, clubbing or edema. No rash.  NEURO: He is alert and oriented x3. Cranial nerves II-XII are intact.  Moves all 4 extremities without difficulty.   ASSESSMENT/PLAN:  Mr. Lundstrom is doing remarkably well. Unfortunately, he  is INTOLERANT TO BETA-BLOCKERS, and thus we will keep his medications  where he is. We will eventually try to titrate his ACE inhibitor up. He  is due for routine labs to followup on his kidney function and his  potassium. We did once again discuss the possibility of the Cardio MEMS  pulmonary sensor. He is interested in this and will continue to think  about it. We will discuss further his next visit in six  weeks.     Bevelyn Buckles. Bensimhon, MD  Electronically Signed    DRB/MedQ  DD: 02/26/2007  DT: 02/26/2007  Job #: [id number]   cc:   Gaspar Garbe, M.D.

## 2011-04-19 NOTE — Assessment & Plan Note (Signed)
[facility]   NAME:  [name], [name]               ACCOUNT NO.:  [id number]   MEDICAL RECORD NO.:  [id number]      DATE OF BIRTH:  Dec 22, 1923   PHYSICIAN:  Olga Millers, M.D. [facility] VISIT DATE:  04/07/2006                                     OFFICE VISIT   PROCEDURE:  Cardioversion of atrial fibrillation.   CARDIOLOGIST:  Olga Millers, M.D.   DESCRIPTION OF PROCEDURE:  The patient was sedated with pentothal 100 mg  intravenously.  Synchronized cardioversion with 120 joules (biphasic)  resulted in sinus rhythm with ventricular pacing.  There were no immediate  complications.  We would recommend continuing his Coumadin and following up  as previous.  His pacemaker will need to be interrogated.           ______________________________  Olga Millers, M.D. [facility]     BC/MEDQ  D:  04/07/2006  T:  04/08/2006  Job:  [id number]

## 2011-04-19 NOTE — Assessment & Plan Note (Signed)
[facility]                   COUMADIN / CHRONIC HEART FAILURE CLINIC NOTE   BEATRIZ, SETTLES                      MRN:          [id number]  DATE:09/02/2006                            DOB:          04-Oct-1924    HISTORY:  Mr. Yeley returns today for further evaluation and medication  titration of his congestive heart failure, secondary to a history of  ischemic cardiomyopathy with an ejection fraction of 35%.  When I law Saw  Mr. Thieme in September, I had increased his Lasix to 80 mg q.a.m. and 40 mg  q.p.m. for three days, as the patient was found to be slightly volume-  overloaded at that time.  Mr. Surratt responded well to the increased  diuretic and has almost returned to baseline today.  He is complaining of  some mild edema in his feet and ankles, otherwise no shortness of breath and  his appetite has been good.  He denies any orthopnea or PND, lightheadedness  or dizziness.   PAST MEDICAL HISTORY:  1. Congestive heart failure, secondary to diastolic dysfunction in the      setting of chronic atrial fibrillation.  An echocardiogram in March      2007, showed an ejection fraction of 35% with mild mitral      regurgitation.  2. Paroxysmal atrial fibrillation, maintaining sinus rhythm on amiodarone.  3. Anticoagulation therapy.  4. History of symptomatic bradycardia, status post permanent pacemaker      with a Medtronic impulse dual-chamber pacemaker in 2006.  5. History of anemia, secondary to GI bleed.  6. Hypertension.  7. Hyperlipidemia.  8. Coronary artery disease, status post cardiac catheterization in      September 2006, with chronic total occlusion of the left anterior      descending, with right to left and left to left collaterals.  Minimal      non-obstructive coronary artery disease in the circumflex and the right      coronary artery.  Ejection fraction was 45% at that time.   REVIEW OF SYSTEMS:  As stated above in the  history of present illness.  Otherwise negative.   ALLERGIES:  PENICILLIN.   CURRENT MEDICATIONS:  1. Aspirin 81 mg daily.  2. Ocuvite daily.  3. Prilosec 20 mg daily.  4. Lisinopril 2.5 mg daily.  5. Claritin daily.  6. Lasix 40 mg daily.  7. Flomax two tab daily.  8. Coumadin, per the [facility].  9. Klonopin 0.5 mg daily.  10.K-Dur 20 mEq daily.  11.Amiodarone 200 mg daily.   PHYSICAL EXAMINATION:  VITAL SIGNS:  Weight today is 162 pounds, blood  pressure 108/64, pulse 74.  GENERAL:  Mr. Arvidson is in no acute distress.  He appears well.  He is  ambulating in the clinic without difficulty.  NECK:  Jugular venous distention at 9 cm at a 45-degree angle with prominent  CV wave.  LUNGS:  Clear to auscultation.  CARDIOVASCULAR:  S1 and S2.  The patient has a 3/6 mitral regurgitation  murmur and a 2/6 tricuspid regurgitation murmur.  ABDOMEN:  Soft, nontender.  Positive bowel sounds.  EXTREMITIES:  Lower extremities:  The patient has +1 pitting edema in the  feet and ankle area.  NEUROLOGIC:  The patient is alert and oriented x3.  Cranial nerves II-XII  are grossly intact.   IMPRESSION:  Mr. Garciagarcia with class 2 to 3 heart failure, who has experienced  gradual volume overloading in the last six weeks.  I was able to diurese the  patient sufficiently to remove some of the access fluid over the last week;  however, Mr. Willhite has continued to have increased weakness and fatigue.   I sent the patient for an EP evaluation, for consideration of Bi-V after the  last office visit.  The patient saw Dr. Doylene Canning. Ladona Ridgel today, who agrees  that the patient would be a candidate for CRT therapy and this has been  arranged, to occur on this Friday.  We will obtain baseline lab work prior  to the procedure, and I will see the patient back in a couple of weeks after  his CRT therapy.      ______________________________  Dorian Pod, ACNP    ______________________________   [facility]   MB/MedQ  DD:  09/02/2006  DT:  09/03/2006  Job #:  [id number]

## 2011-04-19 NOTE — Discharge Summary (Signed)
NAMECONLEY, DELISLE               ACCOUNT NO.:  [id number]   MEDICAL RECORD NO.:  [id number]          PATIENT TYPE:  INP   LOCATION:  4730                         FACILITY:  [facility]   PHYSICIAN:  Arvilla Meres, M.D. LHCDATE OF BIRTH:  [birth year]   DATE OF ADMISSION:  02/03/2006  DATE OF DISCHARGE:  02/07/2006                                 DISCHARGE SUMMARY   PRINCIPAL DIAGNOSIS:  Acute diastolic congestive heart failure.   OTHER DIAGNOSES:  1.  Atrial fibrillation.  2.  History of coronary artery disease.  3.  History of complete heart block status post Medtronic dual chamber      permanent pacemaker.  4.  History of colon polyps and diverticulosis.  5.  Anemia secondary to gastrointestinal bleed in 2002.  6.  Osteoporosis.  7.  Hypertension.  8.  Hyperlipidemia.  9.  Osteoarthritis.  10. Benign prostatic hypertrophy.  11. Hiatal hernia.  12. Glaucoma.   ALLERGIES:  PENICILLIN.   PROCEDURES:  1.  2-D echocardiogram.  2.  Pacemaker interrogation.   HISTORY OF PRESENT ILLNESS:  A 77 year old male with prior history of  coronary artery disease and complete heart block status post Medtronic  pacemaker placement. He recently suffered from a Norovirus infection  approximately 3 weeks ago with significant diarrhea and since that time, has  been very weak. Approximately 1 week ago, he felt short of breath and  hypotensive and saw his primary care physician and his Avalide was  discontinued. Despite this, he continued to have worsening fatigue and  shortness of breath with minimal exertion, prompting him to present to the  emergency department on February 03, 2006. In the emergency room, his EKG  revealed atrial fibrillation and he was felt to be markedly volume  overloaded. He was admitted for further evaluation and diuresis.   HOSPITAL COURSE:  His pacemaker was interrogated on February 03, 2006 revealing  atrial fibrillation since January 13, 2006. This was felt likely to be  contributing to his heart failure as well as explaining his weakness  symptoms. He was aggressively diuresed and placed on fluid restriction  secondary to hyponatremia with a sodium initially of 117. Notably, he also  had mild elevation in cardiac markers with a CK MB of 11.9 and troponin I of  0.14. A 2-D echocardiogram was performed on February 04, 2006 revealing  moderately decreased left ventricular systolic function, estimated to be 35%  with moderate to severe hypokinesis of the anteroseptal and apical walls.  This was a new finding as his ejection fraction was previously documented to  be 45% in 2006. With diuresis, his weight came down to 171 pounds (he was  190 on admission) and his sodium improved gradually. He was allowed a  liberal sodium diet. His atrial fibrillation has been well rate controlled  and his heart failure symptoms have improved. His Lasix was switched over to  oral and he is ready for discharge today. His INR is not therapeutic on  discharge, however, he will followup with the [facility] Coumadin  Clinic on Monday, February 10, 2006. He will also talk with  Dr. Gala Romney on  Friday, February 14, 2006 in heart failure clinic. Plan is for DC Cardioversion  in approximately 4 weeks provided that his INR is therapeutic over that  time. He is being discharged home today in satisfactory condition.   LABORATORY DATA:  On discharge, hemoglobin 14.2 and hematocrit 40.9. White  blood cell count 9.1. Platelets 282,000. MCV 98.8. Sodium 129, potassium  4.2, chloride 92, CO2 28, BUN 11, creatinine 1.0, glucose 102. PT 16.2, INR  1.3, heparin level 0.54. Total bilirubin 0.7, alkaline phosphatase 53, AST  29, ALT 37, albumin 3.2. CK 399. CK MB 9.1. Troponin I 0.19. Total  cholesterol 118. Triglycerides 35, HDL 52, LDL 59. Calcium 9.2. Urinalysis  was negative. BNP on February 06, 2006 was 246.2, a.m. Cortisol was 13.4, TSH  1.388.   DISPOSITION:  The patient is being discharged home  today in good condition.   FOLLOW UP:  1.  He has a followup appointment with [facility] on      Monday, February 10, 2006 at 11:45 a.m.  2.  He will then see Dr. Gala Romney in the Heart Failure Clinic on February 14, 2006 at 2:45 p.m. He will have a B-met checked that day.   DISCHARGE MEDICATIONS:  1.  Aspirin 81 mg daily.  2.  Flomax 0.4 mg daily.  3.  Claritin 10 mg daily.  4.  Klonopin 0.5 mg b.i.d.  5.  Lisinopril 10 mg daily.  6.  Zebeta 5 mg daily.  7.  Lasix 40 mg 2 tabs in the a.m. and 1 tab in the p.m.  8.  Potassium chloride 40 meq daily.  9.  Coumadin 7.5 mg q.h.s.  10. Ocuvite daily.  11. Nexium 40 mg daily.   OUTSTANDING LABORATORY STUDIES:  None.   DURATION OF DISCHARGE ENCOUNTER:  45 minutes including physician time.      Ok Anis, NP      Arvilla Meres, M.D. [facility]  Electronically Signed    CRB/MEDQ  D:  02/07/2006  T:  02/08/2006  Job:  [id number]   cc:   Charlton Haws, M.D.  [address]  [address]  [place]  [place] [address]   Gaspar Garbe, M.D.  Fax: [phone]   Wilhemina Bonito. Marina Goodell, M.D. [facility]  [address]  [place]  [place] [address]

## 2011-04-19 NOTE — Discharge Summary (Signed)
[facility]  Patient:    GRACEN, SOUTHWELL Visit Number: [id number] MRN: [id number]          Service Type: MED Location: [phone] Attending Physician:  Gaspar Garbe Dictated by:   Gaspar Garbe, M.D. Admit Date:  11/13/2001 Disc. Date: 11/15/01   CC:         Wilhemina Bonito. Eda Keys., M.D. [facility]   Discharge Summary  DIAGNOSES: 1. Gastroesophageal bleed. 2. Colon polyps. 3. Diverticulosis. 4. Anemia secondary to gastroesophageal bleed. 5. Hypertension. 6. Hyperlipidemia. 7. Benign prostatic hypertrophy.  HOSPITAL COURSE:  The patient was admitted on November 13, 2001 after being seen in my office. To my estimation, he had a GI bleed earlier and we are not certain as to exactly when but suspect it had been approximately one week earlier and we are uncertain as to whether he was having ongoing GI bleeding. At this point, a hematocrit was 24 at my office and he was admitted as a scheduled admission. He had two peripheral IVs placed and was given two units of packed red blood cells as well as fluid rehydration as the patient had been 4.5 pounds less weight than his usual weight. The patient responded reasonably well to two units but still remained anemic with a hemoglobin of 8.9 noting only a 1.2 hemoglobin increase. He was given two more units with his blood count eventually reaching a hemoglobin of 12.3 and hematocrit of 35.7. The patient indicated that he was feeling better at this time.  GI consult was obtained through Dr. Marina Goodell with [facility]. The patient was prepped for EGD and colonoscopy which were performed in the morning of November 15, 2001. A specific site of bleeding was not found but it was suspected that he had an esophageal tear that had bled and had been mostly healed by the time it was visualized. He was noted as having strictures as well and on colonoscopy, he had two polyps which were biopsied and removed  as well as scattered diverticula along the sigmoid colon. He was also on EGD found to have duodenal diverticula at the duodenal apex. The above mentioned stricture of the esophagus also had evidence of esophagitis. No active bleeding, ulcer, AVM, or Dieulafoys lesion was noted. However, it was noted that a small bowel lesion could not be completely ruled out with these studies. The patient recovered from the procedure anesthesia and was able to maintain his hematocrits. Since no bleeding site was noted and he was not found to be having any active bleeding, he was discharged on November 15, 2001 in good condition.  DISCHARGE INSTRUCTIONS:  The patient is to stop using aspirin and nonsteroidals for a period of two to three weeks due to the polyp biopsies and recent bleeding. He may resume his Avalide for blood pressure as needed, which was 300/12.5 once daily. In addition, he will be on Prevacid 30 mg and to take one tablet each morning. A prescription was given with 30 tablets and 5 refills. The patient is to follow up with Dr. Marina Goodell in three to four weeks and a small bowel study will be scheduled along that time as well. He is not on any dietary restrictions, although it was noted that he should chew meat and bread well.  DISCHARGE MEDICATIONS: 1. Prevacid 30 mg q.d. 2. Avalide 300/12.5 p.o. q.d. 3. Zyrtec 10 mg p.o. q.d. 4. Flomax 0.4 mg p.o. q.d. 5. Pravachol 20 mg p.o. q.d. 6. Ocuvite. 7. Depo-Testosterone 150  mg every two weeks by injection.  LABORATORIES:  Admitting hemoglobin 7.7 and hematocrit 22.5. Final hemoglobin and hematocrit 11 and 32.1 following four units of transfusion. His INR was 1.0 at time of admission. His electrolytes were within normal limits as well during the course of his hospitalization.  PROCEDURES: 1. Esophagogastroduodenoscopy. 2. Colonoscopy. Dictated by:   Gaspar Garbe, M.D. Attending Physician:  Gaspar Garbe DD:  11/15/01 TD:   11/15/01 Job: [id number]

## 2011-04-19 NOTE — Assessment & Plan Note (Signed)
[facility]                   COUMADIN / CHRONIC HEART FAILURE CLINIC NOTE   LUCCIANO, VITALI                      MRN:          [id number]  DATE:09/30/2006                            DOB:          [birth year]    Mr. Zane returns today for further evaluation of medication titration of  his congestive heart failure, which is secondary to history of ischemic  cardiomyopathy with an EF of 35%.  Mr. Sweetser was recently discharged from  [facility], status post placement of a biventricular ICD, at which time he  experienced a postoperative hematoma.  His Coumadin therapy was decreased  during that time, given ICD pocket hematoma.  The patient saw Dr. Ladona Ridgel for  followup on September 26, 2006, at which time he felt the hematoma was stable,  and the patient could resume full dose Coumadin therapy.  Mr. Ramson states  he has been feeling quite well.  His wife states he fell a couple nights ago  going to the bathroom after he had taken an Ambien for sleep.  He suffered  no injuries secondary to the fall.  Daughter states she actually thinks the  ICD site is starting to look better.  It is not as bruised or swollen as it  had been in the past few weeks.  Wife has been concerned about the patient's  weight.  He has continued to lose weight; however, it appears to be more  muscle mass.  The patient states his appetite is not as good as it used to  be.  He continues to ambulate with the assistance of a walker.  He denies  any firing from his defibrillator.  No presyncope or syncopal episodes.  No  peripheral edema.   PAST MEDICAL HISTORY:  1. Congestive heart failure secondary to diastolic dysfunction in the      setting of atrial fibrillation; however, the most recent echocardiogram      showed an EF of 35% with mild mitral regurgitation.  This was done in      March of 2007.  2. Paroxysmal atrial fibrillation.  Maintain sinus rhythm on amiodarone      and  anticoagulation therapy.  3. Recent upgrade to a biventricular device.  4. Coronary artery disease status post cardiac catheterization in      September of 2006.  EF at that time was 45%.   REVIEW OF SYSTEMS:  As stated above in the history of present illness.   CURRENT MEDICATIONS:  1. Aspirin 81 mg daily.  2. Prilosec.  3. Lisinopril 2.5 mg daily.  4. Lasix 40 mg daily.  5. Flomax 0.4 mg two tablets daily.  6. Klonopin 0.5 mg daily.  7. K-Dur 20 mEq daily.  8. Amiodarone 200 mg daily.   PHYSICAL EXAMINATION:  VITAL SIGNS:  Weight 159, blood pressure 112/67,  pulse 68.  GENERAL:  Mr. Eckstein is in no acute distress.  NECK:  No jugular venous distention at 45 degree angle.  LUNGS:  Clear to auscultation.  CHEST:  Device site to left upper chest incision healing nicely.  Does have  a small hematoma noted with some  ecchymotic areas around the site.  CARDIOVASCULAR:  The patient was S1, S2 with a 3/6 systolic ejection murmur.  LOWER EXTREMITIES:  Without clubbing or cyanosis.  The patient does have +1  edema to the right ankle.   IMPRESSION:  Congestive heart failure secondary to ischemic cardiomyopathy,  stable at this time, status post recent placement of a biventricular ICD  implantation with a small hematoma at the pocket.  I am going to have the  patient decrease his Lasix to 20 mg daily.  Wife understands if the  patient's weight increases for 2 days she is to give him the additional 2 mg  as needed.  She has my phone number.  The patient's wife knows to call if  the patient experiences any problems.  If not, I will decrease the Lasix and  see the patient back in 4 weeks.     Dorian Pod, ACNP    MB/MedQ  DD: 09/30/2006  DT: 10/01/2006  Job #: [id number]

## 2011-04-19 NOTE — Op Note (Signed)
[name], [name]               ACCOUNT NO.:  [id number]   MEDICAL RECORD NO.:  [id number]          PATIENT TYPE:  INP   LOCATION:  2012                         FACILITY:  [facility]   PHYSICIAN:  Doylene Canning. Ladona Ridgel, MD    DATE OF BIRTH:  [birth year]   DATE OF PROCEDURE:  09/05/2006  DATE OF DISCHARGE:  09/06/2006                                 OPERATIVE REPORT   PROCEDURE PERFORMED:  Removal of a previously implanted dual-chamber  pacemaker generator, and insertion of a new biventricular ICD.   I. INTRODUCTION:  The patient is an 77 year old male with a history of  complete heart block. status post dual-chamber pacemaker insertion, with a  history of ischemic cardiomyopathy, class III heart failure, who is admitted  to the hospital for biventricular ICD upgrade.  Of note, the patient has a  QRS duration of 198 milliseconds (paced).   II. PROCEDURE:  After informed consent was obtained, the patient was taken  to the diagnostic EP lab in the fasting state.  After the usual preparation  and draping, intravenous fentanyl and midazolam were given for sedation.  Thirty cc of lidocaine was infiltrated over the old pacemaker insertion  site, and a 6 cm incision was carried out over this region.  Electrocautery  was utilized to dissect down to the fascial plane.  Care was taken not to  enter the previous ICD pocket.  The left subclavian vein was punctured x2,  and difficulty was initially had in trying to advance the J-wire.  Ultimately a slick wire and a Wholey wire were utilized.  There were noted  to be stenoses at the left subclavian vein junction as well as the  innominate vein and superior vena cava junction.  These were both traversed  with some difficulty, but no untoward hemodynamic side effects.  The  coronary sinus was subsequently cannulated without difficulty, and the  guiding LV catheter was advanced into the coronary sinus.  Venography of the  coronary sinus was then carried out.   This demonstrated a shepherd's crook  lateral vein which was selected for LV lead deployment.  The Medtronic  subselective catheter was advanced into the coronary sinus and  subselectively used to cannulate the lateral vein. Venography of this vein  was carried out which demonstrated a nice large vein.  The Medtronic model  4194, 88-cm passive fixation LV pacing lead, serial number [id number], was  advanced into the lateral vein and the left ventricle, approximately 2/3 of  the distance from base to apex.  In this location, there was no  diaphragmatic stimulation. The pacing threshold was approximately 2 volts at  0.4 msec.  Pacing impedance was 536 ohms in the left ventricle.  With these  satisfactory parameters, attention was then turned to placement of the RV  lead, which was placed in the RV septum, where R-waves measured 5 mV, and  the pacing impedance was 600 ohms, threshold of 1 volt at 0.4 msec.  With  these satisfactory parameters, the leads were secured to the subpectoralis  fascia with a figure-of-eight silk suture.  Sewing sleeve was also  secured  with silk suture.  Kanamycin irrigation was utilized to irrigate the pocket.  At this point, electrocautery was utilized to expand the previous pacemaker  pocket to fit the biventricular ICD.  At this point, the old RV pacing lead  was capped, and the right atrial lead was evaluated, with P-waves of 1 mV,  and a threshold of 1 V at 0.4 msec, and a pacing impedance of 408 ohms  through the atrium.  At this point, the new Medtronic St[place]leton, model  [id number] biventricular ICD, serial number [id number] was connected to the  right atrial, right ventricular, and left ventricular leads, and placed back  in the subcutaneous pocket.  The generator secured with silk suture.  Additional kanamycin was utilized to irrigate the pocket, and defibrillation  threshold testing was carried out.   After the patient was more deeply sedated with fentanyl  and Versed, VF was  induced with a T-wave shock, and a 15-joule shock was delivered, terminating  VF and restoring sinus rhythm.  At this point, no additional defibrillation  threshold testing was carried out, and the incision closed with layer of 2-0  Vicryl followed by a layer of 3-0 Vicryl, followed by a layer of four  Vicryl.  Benzoin was painted on the skin.  The Steri-Strips were applied,  and a pressure dressing was placed, and the patient was returned to his room  in satisfactory condition.   III COMPLICATIONS:  There were no immediate procedural complications.   IV RESULTS:  This demonstrates successful removal of a previous implanted  Medtronic pacemaker, followed by successful implantation of a new Medtronic  biventricular ICD utilizing coronary sinus venography and defibrillation  threshold testing.           ______________________________  Doylene Canning. Ladona Ridgel, MD     GWT/MEDQ  D:  09/05/2006  T:  09/07/2006  Job:  [id number]   cc:   Bevelyn Buckles. Bensimhon, MD  Gaspar Garbe, M.D.

## 2011-04-19 NOTE — Assessment & Plan Note (Signed)
[facility]                           ELECTROPHYSIOLOGY OFFICE NOTE   [name]                      MRN:          [id number]  DATE:09/16/2006                            DOB:          1924/10/16   [name] returns today for followup. He is a very pleasant elderly male  with a history of congestive heart failure, complete heart block and a  widened QRS who underwent a BiV ICD implantation approximately 2 weeks ago.  Postoperatively he developed a hematoma. The patient has a history of atrial  fibrillation and he returns today for followup of all of the above. He  denies palpitations and his A fib has been fairly well controlled. The  patient did note a hematoma over his ICD insertion and some ecchymotic areas  and bruising around these as well.   PHYSICAL EXAMINATION:  GENERAL:  He is a pleasant, elderly man in no acute  distress.  VITAL SIGNS:  His blood pressure was 123/75, the pulse was 59 and regular,  respirations were 18 and the weight was 160 pounds.  NECK:  Revealed no jugular venous distention.  LUNGS:  Clear bilaterally to auscultation. There were no wheezes, rales or  rhonchi.  CARDIOVASCULAR:  Revealed a regular rate and rhythm with normal S1 and S2.  ICD insertion site demonstrates a large hematoma with no bleeding present.  There was surrounding ecchymoses.  EXTREMITIES:  Demonstrated no edema.   His threshold was a volt of 0.4 in the left ventricle and the right  ventricle and 1.5 and 0.7 in the right atrium. P waves were 1 in the atrium,  5 in the ventricle. Impedance 440 in the atrium, 552 in the right ventricle  and 472 in the left ventricle. The battery voltage was 3.2 volts. He was V  paced 99% of the time.   IMPRESSION:  1. Ischemic cardiomyopathy.  2. Complete heart block.  3. Congestive heart failure.  4. Status post BiV ICD insertion.  5. Postoperative hematoma.   DISCUSSION:  Overall, [name] is  stable. I have counseled him on the  importance of observing his hematoma. If he develops bleeding then he is  instructed to call us immediately. I will plan to see him back in several  months.            ______________________________  Doylene Canning. Ladona Ridgel, MD   GWT/MedQ DD:  09/16/2006 DT:  09/17/2006 Job #:  [id number]   cc:   Gaspar Garbe, M.D.  Bevelyn Buckles. Bensimhon, MD

## 2011-04-19 NOTE — Cardiovascular Report (Signed)
NAMEPHILL, [name]               ACCOUNT NO.:  [id number]   MEDICAL RECORD NO.:  [id number]          PATIENT TYPE:  OIB   LOCATION:  2859                         FACILITY:  [facility]   PHYSICIAN:  Charlton Haws, M.D.     DATE OF BIRTH:  Apr 12, 1924   DATE OF PROCEDURE:  DATE OF DISCHARGE:                              CARDIAC CATHETERIZATION   PROCEDURE:  Coronary arteriography.   INDICATIONS:  Fatigue, complete heart block, abnormal Cardiolite with apical  infarct.   Cine catheterization was done from the right femoral artery.   The patient had high-grade second-degree heart block and Wenckebach during  the entire procedure heart rates were as low as 38 plans were made to admit  the patient electively after catheterization.   1.  Left main coronary artery had a 20% discrete stenosis.  2.  Left anterior descending artery appeared to have a chronic occlusion      proximally. There were left-to-left and right-to-left collaterals.  3.  The circumflex coronary artery had 20% multifeed lesions in the proximal      and midportion.  The first obtuse marginal branch had 30% multifeed      relations  4.  The right coronary artery was dominant. There 20-30% multifeed lesions      in the proximal and midvessel. There are significant right-to-left      collaterals as well.   RAO VENTRICULOGRAPHY:  RAO ventriculography showed anterior apical wall  hypokinesis with an EF of 45%. There was no gradient across aortic valve and  no MR.   IMPRESSION:  The patient's primary symptoms have been fatigue. He has high-  grade heart block.  His LAD occlusion looks chronic and it does not appear  that it would be easily recanalized percutaneously.  I do not think that his  symptoms warrant CABG.  The patient will be admitted to the hospital and  will have the pacemaker placed tomorrow, if his symptoms are not markedly  improved; or if he has significant chest pains in the future, certainly some  type of  revascularizations most likely CABG can be considered   Following his pacemaker implantation, I suspect, he will feel much better.           ______________________________  Charlton Haws, M.D.     PN/MEDQ  D:  08/13/2005  T:  08/13/2005  Job:  [id number]

## 2011-04-19 NOTE — Assessment & Plan Note (Signed)
[facility]                          CHRONIC HEART FAILURE NOTE   [name], [name]                      MRN:          [id number]  DATE:11/07/2006                            DOB:          08/22/1924    Primary cardiologist is Dr. Nicholes Mango, EP is Dr. Lewayne Bunting,  primary care is Dr. Guerry Bruin.  [name] returns today for  further evaluation and medication titration of his congestive heart  failure, which is secondary to ischemic cardiomyopathy with an EF of  35%.  [name] is status post recent hospitalization for placement of  a biventricular ICD.  He followed up with Dr. Ladona Ridgel in November, at  which time is pacer site appeared to be healing nicely.  There was no  evidence of infection or bleeding, and the patient's hematoma was  essentially resolved at that office visit.  Dr. Ladona Ridgel stated he would  follow up in 4 months.  [name] has improved tremendously since  placement of his bi-V device.  Previously he was extremely fatigued,  weak, no appetite and short of breath with minimal exertion.  He has  made a major turnaround in his overall appearance and energy level, and  is very appreciative of the bi-V device.  He continues to ambulate with  the assistance of a walker.  He states his appetite has improved.  He  actually feels like he has gained a little weight.  No symptoms of  orthopnea, PND, presyncope or syncopal episodes.   PAST MEDICAL HISTORY:  1. Congestive heart failure secondary to systolic dysfunction in the      setting of chronic atrial fib.  Most recent echocardiogram showed      an EF of 35% with mild MR in March of 2007.  2. Paroxysmal atrial fib, maintains sinus rhythm on amiodarone and      anticoagulation therapy.  3. History of coronary artery disease, status post cardiac      catheterization in September of 2006 with chronic total occlusion      of the LAD, with right-to-left and left-to-left  collaterals,      minimal nonobstructive CAD, and the circumflex and the right      coronary artery EF was 45% at that time.  4. History of anemia secondary to GI bleed.  5. History of symptomatic bradycardia, status post permanent pacemaker      with Medtronic EnPulse dual chamber pacemaker in 2006 with upgrade      to a biventricular implantable cardioverter-defibrillator, most      recently.  6. Hypertension.  7. Hyperlipidemia.   REVIEW OF SYSTEMS:  As stated above, otherwise negative.   ALLERGIES:  PENICILLIN.   CURRENT MEDICATIONS:  1. Aspirin 81 mg daily.  2. Prilosec 20.  3. Lisinopril 2.5.  4. Claritin.  5. Lasix 20 mg daily.  6. Flomax 0.4 two tablets daily.  7. Klonopin p.r.n.  8. K-Dur 10 mEq daily.  9. Amiodarone 200 mg daily.  10.Coumadin per [facility].  11.Remeron p.r.n. for sleep.   PHYSICAL EXAMINATION:  Weight 165 pounds, blood pressure 122/74 with  a  pulse of 68.  [name] is in no acute distress today.  He looks very  good.  He has no jugular vein distention at 45 degree angle.  LUNGS:  Clear to auscultation, slightly poor inspiratory effort.  CARDIOVASCULAR:  Exam reveals an S1 and S2, regular rate and rhythm.  ABDOMEN:  Soft, nontender, positive bowel sounds.  LOWER EXTREMITIES:  With 1+ peripheral edema, nonpitting.   IMPRESSION:  Congestive heart failure secondary to ischemic  cardiomyopathy, class II, with tremendous improvement in symptoms status  post biventricular implantable cardioverter-defibrillator implantation.  Will continue current medications.  The patient in the past has worn  support stockings for his peripheral edema.  I have encouraged him to  resume wearing his support stockings again.  He states he would try to  do this.  I am going to have the patient follow up with Dr. Gala Romney  next visit just for a routine cardiology appointment, and then I will  see him back after that appointment.      Dorian Pod, ACNP   Electronically Signed      Bevelyn Buckles. Bensimhon, MD  Electronically Signed   MB/MedQ  DD: 11/07/2006  DT: 11/08/2006  Job #: [id number]

## 2011-04-19 NOTE — Assessment & Plan Note (Signed)
[facility]                            CARDIOLOGY OFFICE NOTE   BRENTIN, SHIN                      MRN:          [id number]  DATE:01/07/2007                            DOB:          1924-11-18    PRIMARY CARE PHYSICIAN:  Gaspar Garbe, M.D.   INTERVAL HISTORY:  [name] is a delightful 77 year old male with  multiple medical problems, including coronary artery disease, with a  mixed cardiomyopathy with an EF of 35%.  He is status post bi-V ICD,  also has mild to moderate mitral regurgitation and history of atrial  fibrillation, currently maintained in sinus rhythm on amiodarone.   He returns today with his family for routine followup.  They say he is  doing wonderfully since his bi-V pacer.  He has become very active and  able to do anything he wants, just about, without significant dyspnea.  He denies any chest pain.  He continues to have some lower extremity  edema, which is perhaps slightly worse than previous.  Otherwise, he is  doing well.   CURRENT MEDICATIONS:  1. Aspirin 81 a day.  2. Prilosec 20 a day.  3. Lisinopril 5 a day.  4. Claritin 10 a day.  5. Lasix 20 a day.  6. Flomax 0.4 b.i.d.  7. Potassium 10 a day.  8. Amiodarone 200 a day.  9. Coumadin.   PHYSICAL EXAM:  He is an elderly male, in no acute distress.  He has  severe scoliosis and is hunched over.  Blood pressure is 138/82, his  heart rate is 68, his weight is 168.  HEENT:  Sclerae are anicteric.  EOMI.  There are no xanthelasmas.  Mucous membranes are moist.  Oropharynx is clear.  NECK:  Supple.  The JVP is about 9 cm of water.  Carotids are 2+  bilaterally without any bruits.  There is no lymphadenopathy or  thyromegaly.  CARDIAC:  He has a regular rate and rhythm with 2/6 holosystolic murmur  at the apex.  There is no S3.  LUNGS:  Clear.  ABDOMEN:  Soft, nontender, nondistended.  No hepatosplenomegaly, no  bruits, no masses.  EXTREMITIES:  Warm  with no cyanosis or clubbing.  There is 2+ edema  bilaterally.  He is wearing compression stockings.  No rashes.  NEUROLOGIC:  He is alert and oriented times three.  Cranial nerves II  through XII are intact.  He moves all four extremities without  difficulty.  Affect is very bright.   ASSESSMENT AND PLAN:  1. Congestive heart failure, secondary to systolic dysfunction.      Volume status is a bit up today and we will go ahead and increase      his Lasix back up to 40 mg once a day and also increase his      potassium.  We will check labs in one week.  We will start him on      Coreg at 3.25 mg b.i.d. and order an echocardiogram to follow up on      his ejection fraction.  2. Hypertension, slightly elevated today.  We are starting him on      Coreg.  3. Hyperlipidemia.  We will check fasting lipids with his next blood      draw next week.   DISPOSITION:  He will return to clinic in two months for routine  followup and hopefully we can titrate his beta blocker a little further.     Bevelyn Buckles. Bensimhon, MD  Electronically Signed    DRB/MedQ  DD: 01/07/2007  DT: 01/07/2007  Job #: [id number]   cc:   Gaspar Garbe, M.D.

## 2011-04-19 NOTE — Discharge Summary (Signed)
[name], [name]               ACCOUNT NO.:  [id number]   MEDICAL RECORD NO.:  [id number]          PATIENT TYPE:  INP   LOCATION:  2012                         FACILITY:  [facility]   PHYSICIAN:  Learta Codding, MD,FACC DATE OF BIRTH:  13-Nov-1924   DATE OF ADMISSION:  09/05/2006  DATE OF DISCHARGE:  09/06/2006                                 DISCHARGE SUMMARY   PROCEDURES:  Insertion biventricular ICD via the left subclavian vein.  Time of discharge 31 minutes.   PRIMARY DIAGNOSIS:  Class III congestive heart failure.   SECONDARY DIAGNOSIS:  1. Left bundle branch block.  2. Paroxysmal atrial fibrillation.  3. Chronic anticoagulation with Coumadin.  4. History of symptomatic bradycardia.  5. History of anemia secondary to GI bleed.  6. Hypertension.  7. Hyperlipidemia.  8. Ischemic cardiomyopathy with an EF of 35% and mild MR.  9. Status post catheterization September 2006 with chronic occlusion of      the distal LAD.  10..  Allergy or intolerance to  PENICILLIN.  1. History of chronic diastolic and systolic congestive heart failure.  2. Osteoarthritis.   HOSPITAL COURSE:  Mr. Krol is an 77 year old male with a history of  coronary artery disease and ischemic cardiomyopathy.  He had increased  shortness of breath and was seen in the office.  He was evaluated by Dr.  Ladona Ridgel and is scheduled for a biventricular ICD upgrade of his previously  existing permanent pacemaker.  Mr. Prokop came to the hospital for this on  09/05/2006.   Mr. Tramell had a Medtronic Concerto Generator inserted, biventricular device  on 09/05/2006.  There was acceptable right atrial, right ventricle, and left  ventricular pacing and sensing post procedure.  He tolerated this well.   The next day Mr. Beals was without chest pain or shortness of breath.  His  site was without ecchymosis or significant hematoma.  He has a chest x-ray  pending but if this is without pneumothorax or abnormal reposition, he  is  tentatively considered stable for discharge on 09/06/2006 with outpatient  follow-up arranged.   DISCHARGE INSTRUCTIONS:  1. His activity level is to be increased gradually.  2. He is to call our office for problems with the incision.  3. He is to follow up with Dr. Ladona Ridgel in the [facility] and a message      has been left with the office.  4. He is to keep the incision clean and dry and can shower in 1 week.   DISCHARGE MEDICATIONS:  1. Aspirin 81 mg every day.  2. Ocuvite daily.  3. Prilosec 20 mg daily.  4. Lisinopril 5 mg every day.  5. Claritin 10 mg a day.  6. Furosemide 40 mg a day.  7. Flomax 0.4 mg 2 tablets daily.  8. Coumadin 2.5 mg every day. except for 5 mg on Friday.  9. Klonopin 0.5 mg every day.  10.K-Dur 20 mEq every day.  11.Amiodarone 2 mg a day.  12.Ambien q.h.s. as prior to admission.   Chest x-ray results just became available as follows: New left AICD, no  pneumothorax.  Cardiomegaly.  Improved interstitial edema and stable small  effusions with COPD.     ______________________________  Theodore Demark, PA-C      Learta Codding, MD,FACC  Electronically Signed    RB/MEDQ  D:  09/06/2006  T:  09/08/2006  Job:  [id number]   cc:   Doylene Canning. Ladona Ridgel, MD  Gaspar Garbe, M.D.

## 2011-04-19 NOTE — Op Note (Signed)
NAMEDHANI, [name]               ACCOUNT NO.:  [id number]   MEDICAL RECORD NO.:  [id number]          PATIENT TYPE:  OIB   LOCATION:  3701                         FACILITY:  [facility]   PHYSICIAN:  Rollene Rotunda, M.D.   DATE OF BIRTH:  11-28-1924   DATE OF PROCEDURE:  08/14/2005  DATE OF DISCHARGE:                                 OPERATIVE REPORT   PRIMARY CARE PHYSICIAN:  Gaspar Garbe, M.D.   CARDIOLOGIST:  Doylene Canning. Ladona Ridgel, M.D.   PREOPERATIVE DIAGNOSIS:  Symptomatic bradycardia and __________ _  incompetence.   POSTOPERATIVE DIAGNOSIS:  Symptomatic bradycardia and __________  incompetence.   PROCEDURE PERFORMED:  Placement of a dual chamber electronic pacemaker.   DESCRIPTION OF PROCEDURE:  Appropriate informed consent was obtained after  fully explaining the procedure to the patient.  The patient was brought to  the catheterization lab.  A left antecubital subclavian area was prepped in  a sterile fashion.  The patient was draped again in a sterile fashion.  The  skin was anesthetized in the subclavian region using 1% lidocaine 30 mL.  An  incision was made on the subclavian and through predominantly blunt  dissection, carried down to the prepectoral fascia.  A pocket was formed  along the prepectoral fascia.  Hemostasis was achieved with electrocautery.  Attention was turned to vascular access.  Venography was performed  identifying the left subclavian.  The vein was punctured using an anterior  wall technique twice.  There was no aspiration of air.  Two guidewires were  inserted under fluoroscopy.  Attention was first turned to the ventricular  lead.  A safe sheath was inserted.  The ventricular lead was then inserted  under fluoroscopic guidance to the right ventricular septum.  After  appropriate placement was confirmed with fluoroscopy, the active fixation,  Medtronic 576 58 cm lead serial number [id number] was attached to the  septum.  The measurements recorded  included an R-wave of 0.9 mV, impedance  1057 ohms, threshold 0.7 V at 0.5 msec, current 0.7 mA.  Under fluoroscopy  there was no evidence of diaphragmatic stimulation with 10 V.  This lead was  then sutured to the prepectoral fascia using silk sutures in the sewing  sleeve.  Attention was then turned to the atrial lead.  Again a safe sheath  was inserted.  The lead was placed in the atrium under fluoroscopic  guidance.  It was easily positioned into the right atrial appendage.  Again,  this was actively fixed under fluoroscopy.  This was a 5076 52 cm Medtronic  active fixation lead serial number [id number].  Measurements obtained  included  a P wave of 3.5 mV, impedance 729 ohms, threshold 1 V at 0.5 msec,  current 1.7 mA.  Again there was no diaphragmatic stimulation 10 V.  As  judged by fluoroscopy.  The lead was sutured to the prepectoral fascia using  silk sutures and sewing sleeve.  The pocket was then copiously irrigated  with Kanamycin solution.  Hemostasis was achieved.  There was slight oozing  of the superficial tissues and fat.  Avitene was utilized  to achieve  hemostasis.  The leads were inserted into a Medtronic EnPulse [id number]  generator serial number [id number].  This lead was then placed in the pocket  and fixed to the prepectoral fascia using silk suture.  Attention was then  again turned to assure  hemostasis.  The wound was closed using a vertical  running 2-0 Vicryl suture followed by a horizontal running 2-0 Vicryl  suture.  The superficial layer was then closed with 4-0 Vicryl in a  horizontal running suture.  The wound was draped with Benzoin and Steri-  Strips.  Pressure dressing was applied.  The patient left the lab in stable  condition.  There were no apparent complications.           ______________________________  Rollene Rotunda, M.D.     JH/MEDQ  D:  08/14/2005  T:  08/15/2005  Job:  [id number]   cc:   Gaspar Garbe, M.D.  [address]   [place]  [place] [address]  Fax: [phone]   Doylene Canning. Ladona Ridgel, M.D.  [address]  [address]  [place]  [place] [address]

## 2011-04-19 NOTE — Assessment & Plan Note (Signed)
[facility]                              CARDIOLOGY OFFICE NOTE   WILLEM, KLINGENSMITH                      MRN:          [id number]  DATE:06/16/2006                            DOB:          1924-10-26    Primary care physician is Dr. Wylene Simmer.   PATIENT IDENTIFICATION:  Mr. Celona is a very pleasant 77 year old male who  returns for routine followup.   PROBLEM LIST:  1.  Congestive heart failure secondary to diastolic dysfunction in the      setting of atrial fibrillation.      1.  Echocardiogram March 2007 with an EF of 35% with mild mitral          regurgitation.  2.  Paroxysmal atrial fibrillation, maintaining sinus rhythm on amiodarone.      Also on Coumadin.  3.  History of symptomatic bradycardia and chronotropic incompetence status      post permanent pacemaker.  4.  History of anemia secondary to gastrointestinal bleed.  5.  Hypertension.  6.  Hyperlipidemia.  7.  Coronary artery disease.      1.  Cardiac catheterization September 2006 with a chronic total          occlusion of the distal LAD with collaterals.  Mild nonobstructive          disease elsewhere.  EF 45% at the time.   CURRENT MEDICATIONS:  Aspirin 1 tablet a day, Prilosec 20 mg a day,  lisinopril 2.5 mg a day, Lasix 40 mg a day, Flomax 0.8 mg a day, Coumadin,  Klonopin 5 mg a day, amiodarone 0.5 mg a day, amiodarone 200 mg a day and  Ambien p.r.n.   MEDICATION ALLERGIES:  PENICILLIN.   INTERVAL HISTORY:  Mr. Antonini returns today with his family.  Overall, he is  doing quite well.  He was at the beach for about a week and a half and felt  great.  Now he is back, and he said over the past week or so he has noticed  some mild shortness of breath with exertion but denies orthopnea or PND.  He  does have some very mild lower extremity edema just at the ankles.  He  denies any palpitations, syncope or presyncope.  He does have occasional  fatigue.   PHYSICAL EXAMINATION:   GENERAL:  He is an elderly male with severe kyphosis.  No significant dyspnea on exertion.  VITAL SIGNS:  Blood pressure is 92/60 with a heart rate of 76.  His weight  is 161 which is baseline.  HEENT:  Sclerae are anicteric.  EOMI.  There is no xanthelasmas.  Mucous  membranes are moist.  NECK:  Supple.  JVP is about 5-6 cm of water.  Carotids are 2+ bilaterally  without bruits.  There is no lymphadenopathy or thyromegaly.  CARDIAC:  Regular rate and rhythm with a 2/6 mitral regurgitation murmur at  the apex.  LUNGS:  Clear.  Mildly decreased at the bases.  ABDOMEN:  Soft, nontender and nondistended.  There is no hepatosplenomegaly.  No bruits.  No mass.  EXTREMITIES:  He has just very mild edema just at the ankles.  NEURO:  He is alert and oriented x3.  Cranial nerves II-XII are intact.  He  moves all four extremities without difficulty.   ASSESSMENT/PLAN:  Congestive heart failure.  Currently seems much improved.  Volume status seems good.  I would not make any changes to his medication  regimen.  He does have some fatigue and dyspnea which I think is chronic and  may actually be related to his kyphosis.  We will check a routine battery of  labs today.  We will also check a TSH given his amiodarone therapy.  He will  need a repeat echocardiogram to follow up on his ejection fraction and  mitral regurgitation.  Finally, we will also refer him to Pulmonary for a  sleep study to work up his fatigue.  Coronary artery disease.  This is stable.  Continue current therapy.  I am  not exactly sure why he is not on a statin.  We will go ahead and put him on  Zocor 20 mg a day.  Atrial fibrillation.  He is maintaining sinus rhythm on amiodarone.  He will  continue his Coumadin and amiodarone.   DISPOSITION:  He will return to clinic in a few months for routine followup.                                Bevelyn Buckles. Bensimhon, MD    DRB/MedQ  DD:  06/16/2006  DT:  06/17/2006  Job #:   [id number]   cc:   Gaspar Garbe, MD

## 2011-04-19 NOTE — Assessment & Plan Note (Signed)
[facility]                   COUMADIN / CHRONIC HEART FAILURE CLINIC NOTE   KADAN, MILLSTEIN                      MRN:          [id number]  DATE:08/26/2006                            DOB:          1924-10-14    HISTORY OF PRESENT ILLNESS:  Mr. Fambro returns today for further evaluation  of medication titration of his congestive heart failure secondary to history  of ischemic cardiomyopathy with EF of 35%.  He also has a history of  persistent atrial fibrillation status post Cardioversion x2, now on  Amiodarone therapy.  He recently called the office complaining of increased  swelling and shortness of breath.  I asked that the patient be evaluated,  and he presented to the office today for follow up.  His wife states that he  has slowly had an increase in his weight since the end of July.  He has  taken extra dose of Lasix 20 mg approximately three times a week over the  last month or so without significant reduction in the fluid retention.  Wife  states he has become more short of breath without exertion, and the patient  states he has increased fatigue and also increased peripheral edema.  The  patient is also pending further evaluation of recent pulse oximetry at  night, at which time the patient's daughter states he experienced some  decrease in his oxygen levels.  The plan is to proceed with a sleep study  within the next few weeks.   PAST MEDICAL HISTORY:  1. Congestive heart failure secondary to diastolic dysfunction in the      setting of atrial fibrillation.  Echocardiogram in March of 2007 showed      an EF of 35% with mild mitral regurgitation.  2. Paroxysmal atrial fibrillation maintaining sinus rhythm on Amiodarone,      also anticoagulated.  3. History of symptomatic bradycardia and competent status post permanent      pacemaker.  4. History of anemia secondary to GI bleed.  5. Hypertension.  6. Hyperlipidemia.  7. Coronary  artery disease status post cardiac catheterization September      2006 with a chronic total occlusion of the distal LAD with collateral      flow, nonobstructive disease elsewhere.  EF at that time was 45%.   MEDICATIONS:  1. Amiodarone 200 mg daily.  2. Potassium 20 mEq daily.  3. Klonopin 0.5 mg daily.  4. Coumadin per Coumadin clinic.  5. Flomax two tablets daily.  6. Lasix 40 mg daily with an additional 20 mg p.r.n. 2-3 times per week.  7. Claritin daily.  8. Lisinopril 2.5 mg daily.  9. Prilosec 20 mg daily.  10.Ocuvite daily.  11.Aspirin 81 mg daily.   ALLERGIES:  PENICILLIN.   REVIEW OF SYSTEMS:  As stated above in history of present illness,  otherwise, negative.   PHYSICAL EXAMINATION:  VITAL SIGNS:  Weight 166 (weight at home has been  162), blood pressure 106/68, pulse 72.  GENERAL:  Mr. Bramblett is in no acute distress.  A very pleasant, elderly, 77-  year-old with severe kyphosis.  He has no jugular venous  distention noted.  CARDIAC:  S1, S2.  Positive S3.  He has 3/6 systolic ejection murmur.  LUNGS:  Clear to auscultation bilaterally.  ABDOMEN:  Soft, nontender, obese.  Lower extremities with 2+ pitting edema  up to the knees.   IMPRESSION:  Class II to Class III heart failure with gradual volume  overload in the last six weeks.  Wife states the patient's usual weight is  155 since July.  This has gradually increased even with extra Lasix three  times a week.  The patient has maintained normal sinus rhythm 60% of the  time.  AV pacing at a rate of 60 intermittently.  The patient has episodes  of mode switches noted earlier in April of this year and one episode in  early July.  No atrial arrhythmias noted since July 2.  Will continue  pacemaker settings and have the patient follow up with EP.  Patient should  be considered for Bi-V therapy with increased symptoms and increased volume  retention with poor response to additional Lasix.  I am going to have the   patient increase his Lasix to 80 mg in the a.m. and 40 mg in the evening  with return to his previous stenting dose of 40 mg daily in three days.  The  patient has not had any improvement in his fluid retention with a decrease  in weight.  His wife has been instructed to call me on Friday, at which time  I will place the patient on Zaroxolyn 2.5 mg for two days.  I am going to  see the patient back in the clinic next week for reevaluation.  Also, check  a BNP and a B-met on him.  I will also have the patient double his potassium  for the next few days.  Continue his other medications at this time.  I will  also check a TSH today as the patient is on Amiodarone therapy.  We will go  ahead and schedule the patient with EP for consideration of Bi-V.                                 Dorian Pod, ACNP                             Bevelyn Buckles. Bensimhon, MD   MB/MedQ  DD:  08/26/2006  DT:  08/28/2006  Job #:  [id number]

## 2011-04-19 NOTE — Assessment & Plan Note (Signed)
[facility]                               PULMONARY OFFICE NOTE   SHAFER, SWAMY                      MRN:          [id number]  DATE:06/26/2006                            DOB:          08/11/1924    REFERRING PHYSICIAN:  Bevelyn Buckles. Bensimhon, MD   REFERRING PHYSICIAN:  Dr. Gala Romney   REASON FOR CONSULTATION:  Evaluation of sleep difficulties.  I had the  pleasure of meeting [name] with his wife today for the evaluation of  sleep difficulties.  He is a 77 year old male who has a medical history  significant for coronary artery disease status post myocardial infarction  with ischemic cardiomyopathy with a recent ejection fraction of 30-35%.  He  says that he has had difficulties with his sleep for several years.  His  difficulties used to be that he would have difficulty staying asleep and  would often times wake up frequently during the middle of the night and as a  result of this feel tired during the day.  He was recently started on  Restoril for a short period of time which seemed to help, but not  completely relieve his symptoms.  Over the last four weeks he has been using  Ambien on a daily basis and he says that this seems to help quite a bit,  although his current sleep pattern is that he will go to bed around 11:30 at  night.  He takes his Ambien shortly before this and then has no difficulty  falling asleep with the Ambien but that he will wake up two to three times  during the course of the night, although, again he says this is somewhat  better with the Ambien and then he will wake up at about 5 in the morning,  but then lay in bed until 6:30 in the morning.  He says that on the nights  he has better sleep he feels more refreshed.  Otherwise, he tends to feel  tired during the day and he will actually doze off for about 30-40 minutes  after eating lunch and then ends up falling asleep at other times during the  day for short  periods of time.  His Epworth score today is 6 out of 24.  His  wife says that he does occasionally snore but this seems to be better than  before and she thinks that he makes noises if he is starting to breathe  again occasionally as well.  He denies any symptoms of coughing, wheezing,  or chest pain.  He does have a history of kyphoscoliosis with significant  spinal curvature and he is seeing a chiropractor for this.  He had  apparently been evaluated by orthopedics for this kyphoscoliosis and was  felt that he was not an operative candidate.  He also had undergone  pulmonary function tests on May 05, 2006 and this showed an FEV1 to FEV  ratio of 76% with an FEV1 of 2.44 L which was 92% predicted and FVC of 3.22  L which was 93% predicted, a total lung capacity of  5.3 L which was 90% of  predicted, and a diffusing capacity which was 22.7 which was 135% of  predicted which would be indicative of essentially normal pulmonary function  tests and his most recent chest x-ray from March 14, 2006 showed  interstitial scarring, cardiac enlargement, and bilateral effusions, and  pleural thickening.   PAST MEDICAL HISTORY:  1.  Coronary artery disease status post myocardial infarction with      congestive heart failure on the basis of ischemic cardiomyopathy with      most recent ejection fraction of 35%.  2.  Atrial fibrillation.  3.  Severe mitral regurgitation.  4.  BPH.  5.  Anemia secondary to GI bleeding.  6.  Hypertension.  7.  Allergies.  8.  Kyphoscoliosis.   PAST SURGICAL HISTORY:  1.  Pacemaker insertion.  2.  Bilateral herniorrhaphy.   CURRENT MEDICATIONS:  Aspirin, Ocuvite, Prilosec, lisinopril, Claritin,  Lasix, Flomax, Ambien, Coumadin, Klonopin p.r.n., potassium, amiodarone.  He  has an allergy to PENICILLIN which he believes caused him to develop a rash.   SOCIAL HISTORY:  He is married, retired.  He used to work in [employer] and  [employer].  There is no history of  tobacco or alcohol use.   FAMILY HISTORY:  Both his parents had heart disease.   REVIEW OF SYSTEMS:  Essentially negative except for what is stated above.   PHYSICAL EXAMINATION:  VITAL SIGNS:  He is 5 feet 8 inches tall, 160 pounds.  Temperature is 98.1, blood pressure is 110/82, heart rate is 78, oxygen  saturation is 96% on room air.  HEENT:  Pupils reactive.  There is no sinus tenderness.  No nasal discharge.  He has a Mallampati airway.  There is no lymphadenopathy, no thyromegaly.  HEART:  S1, S2 with a 2/6 systolic murmur.  CHEST:  He has significant kyphoscoliosis; however, there was no wheezing or  rales.  ABDOMEN:  Soft, nontender, positive bowel sounds.  EXTREMITIES:  He wears compression stockings but there was no signs of  edema, cyanosis, clubbing.  NEUROLOGIC:  He was alert and oriented x3, 5/5 strength.  No cerebral  deficits were appreciated.  He ambulates with a walker.   IMPRESSION:  1.  Insomnia requiring the use of Ambien with history of snoring and      possible apneic events.  Given the fact that he does have significant      underlying coronary artery disease, hypertension, congestive heart      failure, and atrial fibrillation I discussed with him the main concern      with this would be he actually has some form of sleep disordered      breathing.  In his situation it could be possible that he could have      either obstructive sleep apnea, central sleep apnea, or both.  I had      advised him that this could have if, in fact, he does have sleep      disordered breathing, this could have bearing with regards to his      cardiovascular condition and that to further assess this it would be      best for him to undergo an overnight polysomnogram.  However, he says      that he has been through too much recently and that he is sleeping quite      well with his use of his Ambien and as a result he is extremely  reluctant to undergo a sleep test at this time.   Additionally, he feels      that he would not be able to fall asleep in the sleep laboratory and      therefore the test would not be able to provide adequate results.  I had      discussed with him that while I understand his concerns there are      definite things we could do to try and get around this.  In spite of      this, he was still quite reluctant to undergo the sleep test at this      time.  Therefore what I would do in the meantime would have him undergo      an overnight oximetry test and see if he has an oxygen desaturation      which would be more evidence of a possible sleep disordered breathing.      In the meantime, I suggested that he could continue to use his Ambien,      but again, it would be best to determine what is exact cause of his      insomnia in the first place.  2.  Kyphoscoliosis.  His most recent pulmonary function tests did not seem      to indicate any evidence for a restricted defect, although this should      be monitored for premature effects with regards to his pulmonary status,      specifically with regards to possibly developing a restrictive defect      from this in which case this could also contribute to possible      hyperventilation at night.  3.  Amiodarone use for control of atrial fibrillation.  For this he should      have annual follow-up pulmonary function tests to determine if he is      having any adverse pulmonary side effects related to the use of this      medication and I will plan on following up with him in about three to      four weeks.                                   Coralyn Helling, MD   VS/MedQ  DD:  06/26/2006  DT:  06/26/2006  Job #:  [id number]

## 2011-04-19 NOTE — Assessment & Plan Note (Signed)
[facility]                           ELECTROPHYSIOLOGY OFFICE NOTE   [name], [name]                      MRN:          [id number]  DATE:09/26/2006                            DOB:          1924/07/13    [name] returns today for follow up of his congestive heart failure and  multiple other medical problems. The patient is a very pleasant elderly man  with a history of an ischemic cardiomyopathy, complete heart block,  congestive heart failure, who is status post biventricular ICD insertion  several weeks ago.  The patient developed a postoperative hematoma. He has also noted some  fatigue and weakness although in general his dyspnea has improved. We had to  make adjustments in his Coumadin therapy as the patient had been on Coumadin  and this was felt to have contributed to his hematoma. He returns today for  follow up. In general he is stable. His ICD pocket hematoma is slightly  improved. He does note some swelling in his left lower arm, this also has  been stable. There is a question of an insect bite on the arm. His energy  level has increased slightly since his ICD was implanted.   PHYSICAL EXAMINATION:  GENERAL:  He is a pleasant, elderly man in no acute  distress.  VITAL SIGNS:  The blood pressure was 138/84, the pulse 73 and regular, the  respirations were 18. The weight was 158 pounds which is down 2 pounds from  his visit 2 weeks ago.  LUNGS:  Clear to auscultation bilaterally to auscultation, there were rales  in the bases.  EXTREMITIES: Demonstrated trace peripheral edema.  ICD POCKET: There is still a large hematoma but decreased in size compared  to it 2 weeks ago.   IMPRESSION:  1. Ischemic cardiomyopathy.  2. Congestive heart failure.  3. Complete heart block.  4. Status post biventricular ICD implantation.   DISCUSSION:  Overall [name] is stable. His defibrillator appears to be  working normally and his ICD  pocket hematoma is stable. His heart failure is  improving. Will plan to see him back in the office in approximately 6 weeks.    ______________________________  Doylene Canning. Ladona Ridgel, MD    GWT/MedQ  DD: 09/26/2006  DT: 09/28/2006  Job #: [id number]   cc:   Gaspar Garbe, M.D.

## 2011-04-19 NOTE — H&P (Signed)
[name], PETTIE               ACCOUNT NO.:  [id number]   MEDICAL RECORD NO.:  [id number]          PATIENT TYPE:  INP   LOCATION:  4731                         FACILITY:  [facility]   PHYSICIAN:  Arvilla Meres, M.D. LHCDATE OF BIRTH:  [birth year]   DATE OF ADMISSION:  02/03/2006  DATE OF DISCHARGE:                                HISTORY & PHYSICAL   CARDIOLOGIST:  Dr. Charlton Haws   PRIMARY CARE PHYSICIAN:  Dr. Wylene Simmer   GASTROENTEROLOGIST:  Dr. Marina Goodell   ADMISSION DIAGNOSIS:  Shortness of breath with weakness.   HISTORY OF PRESENT ILLNESS:  The patient is a 77 year old Caucasian male  with a history of CAD, complete heart block status post Medtronic pacemaker,  who had a norovirus approximately 3 weeks ago. The patient stated that he  had diarrhea approximately four times every morning for 4 days. He has no  vomiting, no associated chest pain or other symptoms. The patient has been  very fatigued since that point, has never really got the zip back per  patient. Starting last week he became short of breath and hypotensive. He  went in to see Dr. Wylene Simmer who had discontinued Avalide and started him on  Avapro 150 mg daily. The patient said he has gotten worse since that point.  He also has increased fatigue and shortness of breath. At that time he was  __________ and they thought possibly he might be depressed and put the  patient on Lexapro 5 mg daily. The patient states he has no chest pain at  this point but is extremely short of breath and ____________, gets very  fatigued and weak.   PAST MEDICAL HISTORY:  Significant for:  1.  Coronary artery disease with 100% LAD chronic occlusion by      catheterization in 2006.  2.  Complete heart block status post Medtronic dual pacemaker.  3.  Diverticulitis.  4.  Anemia secondary to GI bleed __________.  5.  Osteoporosis.  6.  Hypertension.  7.  Hyperlipidemia.  8.  Osteoarthritis.  9.  BPH.  10. Hiatal hernia.  11.  Glaucoma.   The patient had a cardiac catheterization in 2006 which showed an ejection  fraction of 45%. Stress test was done in 2006 which showed an ejection  fraction of 47%.   MEDICATIONS:  1.  Flomax 0.4 mg daily.  2.  Clonazepam 0.5 mg q.h.s.  3.  Claritin 10 mg daily.  4.  Enteric-coated aspirin 81 mg daily.  5.  Nexium 40 mg daily.  6.  Ocuvite daily.  7.  Avapro 150 mg daily.  8.  Lexapro 5 mg daily.   SOCIAL HISTORY:  The patient lives in [place] with his wife. He is a  retired Engineer, site for [employer]. He is married and has three children. He  never has smoked or drank any alcohol and does not have any recreational  drug use.   FAMILY HISTORY:  Significant for his maternal parent deceased at age 41 due  to old age and father deceased at age 76 due to AAA.   REVIEW OF SYSTEMS:  Significant  for shortness of breath and dyspnea on  exertion, edema, and wheezing. The patient also has had a productive cough  of white sputum. NEUROPSYCH:  He complains of weakness. GI:  He complains of  occasional heartburn. All other systems are negative except as noted above.   PHYSICAL EXAMINATION:  VITAL SIGNS:  Temperature is 96.8, pulse is 60,  respiratory rate is 22, blood pressure is 125/47. He is saturating 96% on  room air.  GENERAL:  He is in no acute distress; however, appears very fatigued.  HEENT:  Pupils are equal, round, and reactive to light. EOMI. Sclerae clear.  He does have erythema in his oropharynx also with red spots.  NECK:  Supple with positive JVD.  CARDIOVASCULAR:  S1, S2, with an S3, 3/6 systolic ejection murmur, pulses 2+  and equal.  LUNGS:  Show wheezes and crackles with decreased sounds in the bases.  SKIN:  No rashes.  ABDOMEN:  Distended with positive bowel sounds, nontender.  EXTREMITIES:  Show 3+ edema.  NEUROLOGIC:  He is alert and oriented x3. Cranial nerves II-XII grossly  intact.   Chest x-ray at this point is pending as well as labs. EKG  shows paced rhythm  at 60 with underlying atrial fibrillation.   The patient was seen and examined with Dr. Gala Romney. The patient has a  fluid overload and fatigue with fast atrial fibrillation and may have pushed  him over the edge. We will admit the patient for diuresis. Will obtain a 2-D  echocardiogram to evaluate his ejection fraction as well as the status of  his MR. I would place the patient on Lovenox, switch over to Coumadin for  pharmacy. Check a TSH and check cardiac markers q.8h. x3. Likely elective DC  cardioversion in approximately 4-6 weeks. Will also interrogate the  patient's pacemaker. Dyslipidemia:  We will check to see his lipid profile  and his liver function tests __________ disease. We will hold his Lexapro at  this point.  The patient does have a history of hypertension. He was recently switched  agents from Avalide to Avapro. We are going to resume his Avapro until we  know his BUN and creatinine is stable. We will also continue with aspirin  for his coronary artery disease. We will follow up with his atrial  fibrillation.     ______________________________  April Humphrey, NP      Arvilla Meres, M.D. [facility]  Electronically Signed    AH/MEDQ  D:  02/03/2006  T:  02/03/2006  Job:  [id number]

## 2011-04-19 NOTE — Assessment & Plan Note (Signed)
[facility]                               PULMONARY OFFICE NOTE   GRABIEL, SCHMUTZ                      MRN:          [id number]  DATE:08/11/2006                            DOB:          1924-05-06    I saw Mr. Nance today with his wife and daughter in followup for evaluation  of his insomnia and possible sleep apnea. He had undergone an overnight  oximetry on August 20 and in summary again this showed that he had an oxygen  saturation nadir of 80% and he spent a total of 51 minutes with an oxygen  saturation below 89%. I had explained to him again that given that fact that  he has a history of coronary artery disease, congestive heart failure,  hypertension, atrial fibrillation with the findings on his overnight  oximetry and his symptoms as previously detailed that I would be extremely  concerned that he has underlying obstructive sleep apnea. I also explained  to him that the next step would be to have him undergo an overnight  polysomnogram. He again says that he was quite reluctant to do this feeling  that he has been through too much recently already and that he is not sure  if he really has a problem with his sleep in spite of the evidence that I  had presented to him. I had strongly encouraged him to consider his options  and I will tentatively schedule him for a followup appointment with me in  approximately 1 month but I advised him that he can certainly call us  earlier if he changes his mind and wishes to pursue further investigations  in the possibility of having obstructive sleep apnea.                                     Coralyn Helling, MD   VS/MedQ  DD:  08/19/2006  DT:  08/20/2006  Job #:  [id number]   cc:   Bevelyn Buckles. Bensimhon, MD

## 2011-04-19 NOTE — Discharge Summary (Signed)
[name], [name]               ACCOUNT NO.:  [id number]   MEDICAL RECORD NO.:  [id number]          PATIENT TYPE:  INP   LOCATION:  2022                         FACILITY:  [facility]   PHYSICIAN:  Arvilla Meres, M.D. LHCDATE OF BIRTH:  October 07, 1924   DATE OF ADMISSION:  04/02/2006  DATE OF DISCHARGE:  04/07/2006                                 DISCHARGE SUMMARY   PRINCIPAL DIAGNOSIS:  Atrial fibrillation.   OTHER DIAGNOSES:  1.  Congestive heart failure with systolic dysfunction, EF 35% with mild      mitral regurgitation in March 2007.  2.  Symptomatic bradycardia and complete heart block status post Medtronic      permanent pacemaker.  3.  History of anemia secondary to gastrointestinal bleed in 2002.  4.  Hypertension.  5.  Hyperlipidemia.  6.  Benign prostatic hypertrophy.  7.  Nonobstructive coronary artery disease by cardiac catheterization      September 2006.   ALLERGIES:  PENICILLIN.   PROCEDURES:  DC cardioversion.   HISTORY OF PRESENT ILLNESS:  A 77 year old white male with multiple medical  problems as outlined above who recently underwent successful cardioversion  for atrial fibrillation on March 17, 2006.  Unfortunately approximately 2  days after cardioversion he began to experience recurrent fatigue with  dyspnea on exertion and presented back to clinic on March 03, 2006 to see Dr.  Gala Romney complaining of weight gain with lower extremity edema and  orthopnea.  An EKG was performed revealing a recurrent atrial fibrillation.  Decision was made to admit him for amiodarone loading and cardioversion if  necessary.   HOSPITAL COURSE:  He was initiated on amiodarone at 400 mg q.8 h.  He  tolerated this well but unfortunately did not convert to sinus rhythm.  He  therefore was scheduled for cardioversion which took place this morning,  04/07/2006.  A TEE was not necessary secondary to documentation of his  therapeutic INR for past month.  He received one 120 joules  shock with  immediate conversion to sinus rhythm.  He will be maintained on amiodarone  400 mg b.i.d. for the next 2 weeks.  At that time it will be decreased to  400 mg daily times 1 month and then 200 mg daily.  He is being discharged  home this afternoon in satisfactory condition.   DISCHARGE LABS:  Hemoglobin 13.3, hematocrit 38.6, WBC 7.7, platelets 207,  MCV 100.  Sodium 134, potassium 3.9, chloride 102, CO2 26, BUN 21,  creatinine 1.1, glucose 114.  PT 37.6, INR 3.8. Total bilirubin 1.4,  alkaline phosphatase 49, AST 34, ALT 39, albumin 3.8. Cardiac markers; CK  149, MB 3.9, troponin I 0.23, total cholesterol 122, triglycerides 49, HDL  51, LDL 61, calcium 9.0, magnesium 2.1.  BNP 324.0, TSH 1.079.   DISPOSITION:  Patient is being discharged home today in good condition.   FOLLOW-UP APPOINTMENTS:  He has follow-up appointment with Dr. Prescott Gum  PA on Apr 25, 2006 at 9 a.m.  He is also to follow up with his primary care  physician, Dr. Wylene Simmer in the next 3-4 weeks.  He has follow-up appointment  at [facility] on May 9 at 10:00 a.m.   DISCHARGE MEDICATIONS:  1.  Amiodarone 400 mg b.i.d. x2 weeks then q.d. x1 month then 200 mg q.d.  2.  Aspirin 81 mg q.d.  3.  Flomax 0.8 mg q.d.  4.  Coumadin to be held tonight 05/07 and tomorrow night 05/08 and he will      have Coumadin Clinic follow-up on 05/09 and he will be directed as to      his dose at that point.  5.  Ocuvite one q.d.  6.  Zoloft 25 mg q.d.  7.  Lasix 40 mg q.d.  8.  Klonopin 0.5 mg daily.  9.  Prilosec 20 mg.  10. Lisinopril 5 mg q.d.   OUTSTANDING LAB STUDIES:  None.   DURATION DISCHARGE ENCOUNTER:  45 minutes including physician time.      Ok Anis, NP      Arvilla Meres, M.D. [facility]  Electronically Signed    CRB/MEDQ  D:  04/07/2006  T:  04/08/2006  Job:  [id number]   cc:   Gaspar Garbe, M.D.  Fax: [phone]

## 2011-04-19 NOTE — Discharge Summary (Signed)
[name], [name]               ACCOUNT NO.:  [id number]   MEDICAL RECORD NO.:  [id number]          PATIENT TYPE:  OIB   LOCATION:  3701                         FACILITY:  [facility]   PHYSICIAN:  Rollene Rotunda, M.D.   DATE OF BIRTH:  [birth year]   DATE OF ADMISSION:  08/13/2005  DATE OF DISCHARGE:  08/15/2005                                 DISCHARGE SUMMARY   THE PATIENT HAS AN ALLERGY TO PENICILLIN.   DISCHARGE DIAGNOSES:  1.  Discharging day one status post implantation of [employer] EnPulse Dual-      Chamber pacemaker.  2.  Admitted with fatigue symptomatic sinus bradycardia, second degree heart      block with the patient in need of beta-blocker treatment for coronary      artery disease.  3.  Day #2 status post left heart catheterization, ejection fraction 45%.      The left anterior descending artery has 100% occlusion which is chronic,      it is fed by both right and left collaterals.  Left circumflex had a 20%      mid point stenosis, the right coronary artery had a 20% proximal to mid      point stenosis, left ventricular anterior apical and hypokinesis, once      again ejection fraction 45%.  A recommendation of medical therapy for      coronary artery disease.   SECONDARY DIAGNOSES:  1.  Adenosine Cardiolite study which was positive for distal anterior,      apical, distal inferior wall defect with partial reversibility, ejection      fraction of 47%.  2.  Progressive fatigue and weakness.  3.  Hypertension.  4.  Dyslipidemia.  5.  Family history of coronary artery disease.   PROCEDURE:  1.  August 13, 2005 -- left heart catheterization.  Study showed left      main 20% stenosis, LAD 100% chronic occlusion fed by right to left      collaterals, left circumflex 20% midpoint stenosis, right coronary      artery 20% proximal to midpoint stenosis, left ventricle with anterior      apical hypokinesis, ejection fraction 45%.  2.  August 14, 2005 -- implantation  of Medtronic Dual-Chamber pacemaker      in patient with symptomatic bradycardia and intermittent second degree      heart block by Dr. Rollene Rotunda.  The patient has had no complications      in the postprocedure period.  He is A sensing and V pacing at the time      of discharge.   DISCHARGE DISPOSITION:  [name] discharging August 15, 2005,  day one after implantation of permanent pacemaker.  He is achieving 94%  oxygen saturation on room air, blood pressure 147/94.  His incision looks  well without hematoma or swelling.  Chest x-ray has been inspected and shows  no new pneumothorax, lungs are clear.  Pacer leads in the right atrial  appendage as well as the right ventricle in proper position.  The pacer has  been interrogated on the  morning of August 15, 2005, no changes made, all  values within normal limits.  Mobility of the left arm has been discussed  with the patient and incision care has also been discussed.  The patient  will continue on critical risk factor management for his coronary artery  disease to include aspirin, ARB and possible initiation of beta-blockade,  beta-blockade to be done as an outpatient.  Prior to discharge [name]  will have dietician discuss heart healthy diet.  He discharges on a low  sodium, low cholesterol diet.  He is asked not to drive for one week, not to  lift any heavy weights for the next two weeks.  He is to keep his incision  dry for the next seven days and to sponge bathe until Wednesday August 21, 2005.  Pain management is with Tylenol 325 mg 1-2 tabs every 4-6 hours.   HOME MEDICATIONS:  1.  Flomax 0.4 mg daily.  2.  Avalide 300/12.5 daily.  3.  Clonazepam 0.5 mg daily at bedtime.  4.  Claritin 10 mg daily.  5.  Enteric-coated aspirin 81 mg daily.  6.  Nexium 40 mg daily.   FOLLOWUP:  1.  Follow-up with [facility], [address] [facility] Wednesday August 28, 2005 at 10:45 in  the morning.  2.  Physician assistant for Dr. Eden Emms Wednesday August 28, 2005 at 11:15      in the morning, and he will see Dr. Lewayne [facility],      [address] Tuesday December 17, 2005 at 9:10 in the      morning.   BRIEF HISTORY:  [name] is a 77 year old male referred by Dr. Wylene Simmer, he  is to be evaluated for bradycardia with intermittent Wenckebach block and  concomitant severe fatigue and weakness.  Because of the patient's symptoms,  and risks for coronary artery disease, he underwent adenosine Cardiolite  study, this was abnormal, it showed fairly substantial distal anterior,  apical, and distal inferior wall defect.  His gaited ejection fraction was  47%.  The patient has no known diagnosis of coronary artery disease, has had  no recurrent syncope.  On cardiac monitoring he has significant bradycardia  with heart rates down into the 30s with an average heart rate of 45 beats  per minute.  This strongly suggest need for permanent pacemaker.  The plan  since the Cardiolite study is abnormal, is to admit for left heart  catheterization followed by implantation of pacemaker.  Pacemaker is  especially relevant since he probably might need to take AV nodal blocking  drugs such as a beta-blocker for his coronary disease.   HOSPITAL COURSE:  The patient presented electively on August 13, 2005 and  underwent left heart catheterization same day.  The study showed a  chronically occluded left anterior descending coronary artery with minor  nonobstructive coronary artery disease in the left circumflex and the right  coronary artery, ejection fraction was 45%.  He was recommended to be  maintained on medical therapy, for risk factor reduction, and underwent then  permanent pacemaker implantation on August 14, 2005.  He has had no post  procedure complications from either the catheterization or the pacemaker implantation.  He will have a dietician  discuss heart healthy diet prior to  his discharge, and he has followup at [facility] for both his  catheterization and for his pacemaker.  He will  also follow-up with Dr.  Wylene Simmer as scheduled.  The patient would also probably benefit with addition  of beta-blocker.  The patient currently A sensing, V pacing.   LABORATORY STUDIES WHILE HERE:  Serum electrolytes -- sodium 136, potassium  4.6, chloride 96, bicarbonate 26, BUN 9, creatinine 0.9, glucose 125.  Complete blood count -- white cells 6.1, hemoglobin 14.8, hematocrit 43.7,  platelets 251.      Maple Mirza, P.A.    ______________________________  Rollene Rotunda, M.D.    GM/MEDQ  D:  08/15/2005  T:  08/15/2005  Job:  [id number]   cc:   Gaspar Garbe, M.D.  [address]  [place]  [place] [address]  Fax: [phone]   Doylene Canning. Ladona Ridgel, M.D.  [address]  [address]  [place]  [place] [address]   Charlton Haws, M.D.  [address]  [address]  [place]  [place] [address]

## 2011-04-19 NOTE — H&P (Signed)
[facility]  Patient:    [name], [name] Visit Number: [id number] MRN: [id number]          Service Type: MED Location: [phone] Attending Physician:  Gaspar Garbe Dictated by:   Gaspar Garbe, M.D. Admit Date:  11/13/2001                           History and Physical  CHIEF COMPLAINT:  Anemia, GI bleed.  HISTORY OF PRESENT ILLNESS:  The patient is a 77 year old white male with a history of hypertension, hyperlipidemia. He had noted on Sunday at a church dinner that he became somewhat nauseated and did not feel like eating anything. Felt somewhat fatigued at that time and wanted to go home. Was taken home by his wife. Doing reasonably well and started having some dark stools that evening. Noted that he was having some weakness but was feeling reasonably well. Some stomach cramping was also noted following a profuse amount of initially black but then watery diarrhea. The patients wife was seen in my office on Wednesday and indicated that he was having difficulty with the diarrhea, and it was not noted the darkness of his stools other than for him taking Pepto-Bismol which did not seem to be helping. He was given a prescription for Lomotil and told to call if he did not feel any better. The patient was seen in the office this morning, December 13, and appeared to be a little bit more pale than he usually had been. He indicated that he continued to feel weak but was having formed stools at this point that were still slightly dark. Did not note any abdominal pain and was back to eating fairly regular meals but indicated that things just did not taste well and that he was feeling weak. The patient underwent an examination which found him to be guaiac positive at that point, and blood count was done which revealed a hematocrit of 24.4 with his normal baseline being 46. The patient was, therefore, admitted directly to [facility].  ALLERGIES:   ACE INHIBITORS with cough. PENICILLIN.  MEDICATIONS: 1. Avalide 300/12.5 q.d. 2. Zyrtec 10 mg q.d. 3. Flomax 0.4 mg q.d. 4. Pravachol 20 mg q.d. 5. Depo-Testosterone 150 mg every two weeks. 6. Ocuvite q.d. 7. FiberCon two tablets q.d. 8. Aspirin 81 mg q.d.  PAST MEDICAL HISTORY: 1. Hypertension. 2. Hyperlipidemia. 3. Hypogonadism on injectable testosterone. 4. Osteoarthritis. 5. Hypoglycemic episodes. 6. BPH. 7. Glaucoma. 8. Osteoporosis.  PAST SURGICAL HISTORY:  The patient had right hernia surgery in January of 2002.  SOCIAL HISTORY:  The patient lives in [place] with his wife. He is a retired Engineer, site for [employer]. He is married with three children. He is a nonsmoker, nondrinker, and generally leads a healthy lifestyle.  FAMILY HISTORY:  Mother died at age 73 of "old age." Father died at age 73 of a triple A. No history of colon cancer is noted.  REVIEW OF SYSTEMS:  The patient denies any fevers, chills, or sweats at this point. No headache, sore throat, nasal discharge, or vision or hearing changes. No skin rashes or lesions. No chest pain, shortness of breath, some slight dyspnea on exertion. No cough is noted. He does not seem to be presyncopal at this point. No GU complaints. He notes weakness on ______  but no numbness. He is not having any myalgias or joint swelling. GI-wise, he had some soft stool this morning  but just one bowel movement with no nausea, vomiting, diarrhea, or GERD symptoms at this point. All other systems are negative. Advanced directives:  The patient is a full code.  PHYSICAL EXAMINATION:  VITAL SIGNS:  Pulse 84, blood pressure 110/50, the patient was not orthostatic to any significant degree in the office, weight 181.5 pounds which is down 4.5 pounds from his baseline.  GENERAL:  No acute distress, slightly pale.  HEENT:  Normocephalic, atraumatic. PERRLA. EOMI. ENT is within normal limits.  NECK:  Supple. No adenopathy, JVD, or  bruits.  CARDIOVASCULAR:  Heart regular rate and rhythm. No murmur, gallop, or rub, although he does have an occasional premature beat which is rarely heard.  LUNGS:  Clear to auscultation bilaterally.  SKIN:  No rashes or lesions. No petechiae present.  ABDOMEN:  Soft, nontender. Normoactive bowel sounds. No rebound or guarding and no hepatosplenomegaly.  RECTAL:  Normal sphincter tone. His stool is heme positive. No gross blood is seen. The prostate is smooth and with slight enlargement but is nontender.  EXTREMITIES:  No clubbing, cyanosis, or edema appreciated.  MUSCULOSKELETAL:  No joint deformity or effusions or tenderness. No spine or CVA tenderness is present.  NEUROLOGICAL:  Oriented x 3. Cranial nerves 2 through 12 are grossly intact. Strength 5/5 bilaterally with normal sensation, normal cerebellar function.  LABORATORY DATA:  Currently pending. In hospital, his white count was 9.5, hemoglobin 8.6, hematocrit 24.4, and platelets 240. At the office, he has a CMET and coags, as well as type and cross pending for 2 units.  ASSESSMENT AND PLAN: 1. Gastrointestinal bleed. Most likely occurred earlier this week, but we are    now still seeing residual effect of anemia. For him, we will type and cross    2 units and hydrate him with 150 cc of normal saline an hour. Most likely    ask for a GI consult in the morning once he has been volume replaced.    Question at this point is to whether an upper or lower gastrointestinal    bleed. He did not show signs of upper bleeding. With his melena, it is    thought that he may have had bleeding related to his small bowel or colon    at this time. He indicates that he may have had some sort of colonoscopy or    flex sig at least greater than five years ago, but he is not certain on    this. My record at the office for the second volume of it did not indicate    a recent test, although he had been guaiac negative within the past years     here for physical. He has a normal white count and I do not think that the     initial symptoms that he was giving which were of gastroenteritis were    exactly true and I doubt that he had any sort of enteric pathogen which    caused him to have this degree of bleeding. A type of colitis or an ulcer,    as well as possible ischemia, would be possible. However, his symptoms were    not specifically of that pain, and ischemia would be unlikely. We will    start him on Protonix at 40 mg a day while we are sorting this out and hold    his aspirin. 2. Hypertension. We will hold his blood pressure medicines until he is fluid    and volume resuscitated in case  he rebleeds. 3. Hyperlipidemia. We will make no changes to Pravachol and continue these. 4. Anemia. As above for transfusion. 5. Hypogonadism. Stable and being followed on an outpatient basis. 6. Glaucoma. We will continue his Ocuvite. Dictated by:   Gaspar Garbe, M.D. Attending Physician:  Gaspar Garbe DD:  11/13/01 TD:  11/13/01 Job: [id number]

## 2011-04-19 NOTE — Assessment & Plan Note (Signed)
[facility]                         ELECTROPHYSIOLOGY OFFICE NOTE   [name], CHAVARIN                      MRN:          [id number]  DATE:10/30/2006                            DOB:          01-13-1924    Mr. Melchior returns today for followup.  He is a very pleasant elderly  man with an ischemic cardiomyopathy and congestive heart failure.  He is  status post biventricular ICD upgrade complicated by a large hematoma.  This was treated conservatively and he has done much better.  His heart  failure is fairly well controlled, although he still has some problems  with sodium compliance secondary to his living in an assisted living  facility and not having specific food for that.  He denies chest pain,  he denies shortness of breath.  He continues to have 1-2+ peripheral  edema.   EXAMINATION:  GENERAL:  He is a pleasant, well-appearing elderly man in  no acute distress.  VITAL SIGNS:  The blood pressure today was 102/62, the pulse 72 and  regular, respirations were 18, the weight was 164 pounds.  NECK:  Revealed no jugular venous distention.  LUNGS:  Clear bilaterally to auscultation.  CARDIOVASCULAR:  Revealed a regular rate and rhythm with normal S1 and  S2.  EXTREMITIES:  Demonstrated no edema.   IMPRESSION:  1. Ischemic cardiomyopathy.  2. Congestive heart failure.  3. Status post biventricular implantable cardioverter defibrillator      insertion.  4. Atrial fibrillation.  5. Chronic amiodarone therapy.   DISCUSSION:  Overall, Mr. Halls is stable.  His defibrillator is  working normally.  His ICD pocket has healed up very nicely and there is  no evidence of any infection or bleeding and his hematoma is essentially  resolved.  I will plan to see the patient back in 4 months, sooner  should he have additional problems.     Doylene Canning. Ladona Ridgel, MD  Electronically Signed    GWT/MedQ  DD: 10/30/2006  DT: 10/31/2006  Job #: [id number]   cc:    Gaspar Garbe, M.D.

## 2011-04-19 NOTE — Assessment & Plan Note (Signed)
[facility]                           ELECTROPHYSIOLOGY OFFICE NOTE   [name], [name]                      MRN:          [id number]  DATE:09/02/2006                            DOB:          07-17-1924    [name] is well-known to me from prior hospitalizations.  He is a very  pleasant elderly man with a history of paroxysmal atrial fibrillation who  has been maintained in sinus rhythm very nicely on Coumadin.  He also has a  long-standing history of ischemic cardiomyopathy and congestive heart  failure.  He underwent dual chamber pacemaker implantation secondary to  heart block back in 2006.  His heart failure, unfortunately, has worsened  over the last year, and now is class III.  His QRS duration is 198 ms  (paced).  He has dyspnea with any exertion.  He has peripheral edema  bilaterally.   MEDICATIONS:  1. Lisinopril.  2. Lasix.  3. Coumadin.  4. Amiodarone.  5. Aspirin.  (He has been on beta blockers in the past, but could not tolerate this  secondary to hypotension.)   PHYSICAL EXAMINATION:  GENERAL:  Notable for a pleasant elderly man in no  distress.  VITAL SIGNS:  Blood pressure today was 108/64, pulse of 74 and regular.  The  respirations were 18.  Weight was 162 pounds.  NECK:  The neck revealed 7-8 cm of jugular venous distention.  There is no  thyromegaly.  LUNGS:  Rales in the bases bilaterally.  There is no increased work of  breathing.  There are no wheezes or rhonchi.  CARDIOVASCULAR:  Regular rate and rhythm with normal S1 and S2.  There is a  grade 2/6 systolic murmur in the left lower sternal border and a grade 1/6  diastolic murmur at the right upper sternal border.  The PMI was enlarged  and laterally displaced.  EXTREMITIES:  Trace peripheral edema bilaterally.  He was wearing  compression stockings.   ELECTROCARDIOGRAM:  Sinus rhythm with AV sequential pacing with a QRS  duration of 198 ms.   IMPRESSION:  1. Ischemic cardiomyopathy.  2. Complete heart block.  3. Status post pacemaker insertion with class III heart failure symptoms.   DISCUSSION:  I have discussed the treatment options with [name] and  offered him either upgrade to a BiV pacemaker or alternatively a BiV ICD.  The advantages and disadvantages, risks, benefits, goals and expectations of  both have been discussed and clearly outlined with the patient, his daughter  and his wife.  They would like to proceed with BiV ICD implantation.  This  will be scheduled at the earliest possible convenient time.  Will check his  Coumadin level to see where we need to go with this prior to his surgery.            ______________________________  Doylene Canning. Ladona Ridgel, MD     GWT/MedQ  DD:  09/02/2006  DT:  09/03/2006  Job #:  [id number]   cc:   Gaspar Garbe, M.D.

## 2011-04-19 NOTE — Op Note (Signed)
[name], [name]               ACCOUNT NO.:  [id number]   MEDICAL RECORD NO.:  [id number]          PATIENT TYPE:  AMB   LOCATION:  CATH                         FACILITY:  [facility]   PHYSICIAN:  Arvilla Meres, M.D. LHCDATE OF BIRTH:  [birth year]   DATE OF PROCEDURE:  03/17/2006  DATE OF DISCHARGE:                                 OPERATIVE REPORT   PROCEDURE:  Direct current cardioversion.   PATIENT IDENTIFICATION:  [name] is a 77 year old male with a history of  complete heart block, status post pacemaker.  He was admitted about six  weeks ago with heart failure and progressive fatigue.  He was found to be in  new-onset atrial fibrillation.  He has undergone six weeks of therapeutic  anticoagulation with INRs greater than 2.  He is brought in today for an  elective direct current cardioversion.   DESCRIPTION OF PROCEDURE:  The patient was sedated by anesthesia.  He was  cardioverted to a normal sinus rhythm with one 200-joule shock.  There are  no apparent complications.  Our [employer] representative was present during  the procedure to make sure his pacemaker was programmed correctly after the  cardioversion.   A follow-up EKG is currently pending.   We will see him back in the clinic in the near future.  Hopefully, he will  be holding a normal sinus rhythm; however, if he converts back to atrial  fibrillation, we may need to consider antiarrhythmic therapy.      Arvilla Meres, M.D. [facility]  Electronically Signed     DB/MEDQ  D:  03/17/2006  T:  03/17/2006  Job:  [id number]

## 2011-04-19 NOTE — Assessment & Plan Note (Signed)
[facility]                               PULMONARY OFFICE NOTE   NAME:[name], [name]                      MRN:          [id number]  DATE:07/25/2006                            DOB:          [birth year]    PROGRESS NOTE:  I have received a report from respiratory test, [facility], for [name] overnight oximetry, which was done on July 21, 2006,  and this showed that he had an oxygen saturation nadir of 80%. He spent a  total of 51 minutes with an oxygen saturation below 89%. He is due to have  followup with me again in the beginning part of September. At that time, I  will again emphasize my primary concern that he may in fact have some  underlying degree of sleep disorder breathing and that further assessment of  this would be warranted, especially given the fact that he has significant  coronary artery disease with congestive heart failure, hypertension, and  atrial fibrillation.                                   Coralyn Helling, MD   VS/MedQ  DD:  07/25/2006  DT:  07/26/2006  Job #:  [id number]

## 2011-04-19 NOTE — Consult Note (Signed)
NAMEGOTTI, ALWIN NO.:  [id number]   MEDICAL RECORD NO.:  [id number]          PATIENT TYPE:  EMS   LOCATION:  MAJO                         FACILITY:  [facility]   PHYSICIAN:  Christell Faith, MD   DATE OF BIRTH:  [birth year]   DATE OF CONSULTATION:  DATE OF DISCHARGE:  03/03/2007                                 CONSULTATION   PRIMARY CARDIOLOGIST:  Bevelyn Buckles. Bensimhon, MD   PRIMARY ARRHYTHMIA SPECIALIST:  Dr. Ladona Ridgel.   CHIEF COMPLAINT:  Did my defibrillator go off?   HISTORY OF PRESENT ILLNESS:  This is an 77 year old white man who sees  Dr. Gala Romney in the Cardiology Clinic for ischemic cardiomyopathy,  paroxysmal atrial fibrillation and he is also status post biventricular  pacer/ICD.  He has been doing very well.  He recently [place] a [place]ach.  His energy level is good.  He is not having any orthopnea or  PND.  No lower extremity edema.  He feels as well as he has in a long  time. Tonight he experienced an unusual sensation over his left hip and  left groin region, which felt like a small series of tingling like  shocks.  This was very mild and in fact was barely perceptible.  It felt  like a tingling on the surface of his skin. He never felt anything like  this before.  Due to the somewhat electric nature of this sensation, he  presented to the emergency department concerned that his defibrillator  might have fired.  When I explained to him what a typical defibrillator  discharge feels like, he was quite sure that that is in fact was not  what he experienced.  There was no feeling of being kicked or thumped in  the center of his chest.  Nothing that was so overtly noticeable as what  I described.  The sensation he experienced lasted no more than 2 minutes  and has not recurred since.  It was not painful.  He has no rash in that  area.   While we were talking, he also mentions that after a prolonged period of  bending over the waist, brushing his  teeth, he transiently felt like his  eyes went black.  This lasted for a split second and then immediately  resolved.  He did not feel lightheaded.  Did not pass out.  Did not fall  to the ground.  There was no seizure.  There was no garbled speech, no  unilateral weakness to the face or body.  His vision was immediately  normal afterwards.  He has felt like this before and this was not his  chief concern tonight at all.   REVIEW OF SYSTEMS:  As described above in the HPI.  In addition, he has  had no chest pain, shortness of breath, nausea, vomiting, diarrhea,  fevers, chills, sweats, lower extremity edema, abdominal pain, trouble  swallowing, gait disturbance, cough, wheezing.  The balance of 10  systems was reviewed and is negative.   PAST MEDICAL HISTORY:  1. Coronary artery disease with history of  chronic total occlusion in      the LAD on most recent catheterization from 2006.  2. Ischemic cardiomyopathy with ejection fraction of 35% and severe      mitral regurgitation.  3. History of complete heart block, he has a biventricular pacer and      implantable cardioverted defibrillator.  4. Paroxysmal atrial fibrillation.  Rhythm controlled with amiodarone.  5. He has a history of shingles.  6. He has a history of a GI bleed in 2002.  7. He has severe scoliosis.   HOME MEDICATIONS:  1. Amiodarone 200 mg p.o. daily.  2. Aspirin 81 mg p.o. daily.  3. Claritin 10 m p.o. daily.  4. Lasix 40 mg p.o. daily.  5. Flomax 0.4 mg p.o. b.i.d.  6. Lisinopril 5 mg p.o. daily.  7. Omeprazole 20 mg p.o. daily.  8. Potassium chloride 20 mEq p.o. daily.  9. Coumadin as directed.   ALLERGIES:  INCLUDE PENICILLIN AND BETA BLOCKERS.   SOCIAL HISTORY:  Married, lives in [place], never smoked, never  drank.   FAMILY HISTORY:  Father died at age of 110 of an AAA.  Mother died at 48  of natural causes.   PHYSICAL EXAMINATION:  Temperature 97.0. Blood pressure lying 129/78,  sitting 125/80.   Heart rate lying 77, sitting 80.  Oxygen saturation 91-  94% on room air.  GENERAL:  This is an elderly, pleasant white man in no distress.  NECK: Supple.  Mucous membranes are moist.  No elevation of JVD.  No  carotid bruits.  LUNGS:  Clear to auscultation bilaterally.  CARDIAC EXAM:  Normal rate, regular rhythm.  2/6 holosystolic murmur at  the apex consistent with mitral regurgitation.  ABDOMEN:  Soft, nontender, nondistended.  Normal bowel sounds.  No  bruits.  MUSCULOSKELETAL:  He is not tender over the pelvis or hips.  Severe  scoliosis.  EXTREMITIES:  No edema, no clubbing, no rash.  NEUROLOGIC EXAM:  He is awake, alert, oriented x3.  Pupils equal, round  and reactive to light.  There is no nystagmus.  His visual acuity is  intact.  His face is symmetric with smile and eyebrow raise.  Shoulder  shrug is strong and symmetric.  He has 5/5 bilateral upper and lower  extremity strength both proximal and distal.  No pronator drift.  Reflexes are 2+ at the knees and ankles bilaterally.  There is no  clonus.   EKG is appropriately paced at 59 beats per minute.  His INR was 2.6 on  Thursday.   IMPRESSION:  This is an 77 year old white man who was concerned that his  defibrillator might have fired tonight.  After taking his history and  explaining to him what a defibrillator discharge would feel like, he is  quite clear that that in fact did not happen tonight and I concur with  this assessment.  We did an EKG and his pacemaker seems to be  functioning appropriately.  His exam is normal.  I feel comfortable  sending the patient back to the [facility] where he and his wife  live.  There is 24 hour nursing care there and both he and his wife, his  daughter, and son-in-law feel very comfortable with discharge from the  emergency room and they are in fact requesting this.  The patient feels reassured that his defibrillator did not fire.  I discussed this with  Dr. Jens Som who is  on call tonight and he concurs with the plan.  I gave him instructions to return to the emergency room for anything  that concerns him including but not limited to chest pain, shortness of  breath, passing out, almost passing out, nausea, swelling, pain, further  concern for defibrillator shocks, vision changes, trouble speaking,  weakness in the arms or legs or anything else.      Christell Faith, MD  Electronically Signed     NDL/MEDQ  D:  03/02/2007  T:  03/03/2007  Job:  [id number]

## 2011-04-19 NOTE — Assessment & Plan Note (Signed)
[name]                         ELECTROPHYSIOLOGY OFFICE NOTE   [name], [name]                      MRN:          [id number]  DATE:12/16/2006                            DOB:          08-23-1924    [name] for followup.  He is a very pleasant elderly  male with a history of ischemic cardiomyopathy and congestive heart  failure status post biventricular ICD upgrade who returns today for  followup.  His initial procedure was complicated by hematoma which has  now resolved.  The patient states that he feels well and in fact states  he feels better than he has in many years.  He denies chest pain.  He  has minimal shortness of breath.  He does continue to have peripheral  edema.   EXAMINATION:  GENERAL:  He is a pleasant elderly man in no distress.  VITAL SIGNS:  Blood pressure 128/81, the pulse 70 and regular, the  respirations were 18, the weight was 168 pounds.  NECK:  Reviewed no jugular venous distention.  LUNGS:  Clear bilaterally to auscultation.  CARDIOVASCULAR:  Revealed a regular rate and rhythm, normal S1 and S2.  EXTREMITIES:  Demonstrated 1+ pitting edema bilaterally.   Interrogation of his pacemaker defibrillator demonstrates a [employer]  [place].  P waves were 1, R waves were 6, and the impedance 432 in the  atrium, 452 in the right ventricle, 344 in the left ventricle.  Threshold 2.5 at 0.2 in the atrium, 1 at 0.4 in the right ventricle.  Battery voltage 3.19 volts.  His underlying rhythm complete heart block.  There are no episodes of VT.   IMPRESSION:  1. Congestive heart failure.  2. Complete heart block.  3. Status post biventricular implantable cardioverter-defibrillator      upgrade.   DISCUSSION:  Overall, [name] and his defibrillator is  working normally.  We will see him back in 1 year for followup and he  will do [facility] every 3 months.     Doylene Canning. Ladona Ridgel, MD  Electronically  Signed    GWT/MedQ  DD: 12/16/2006  DT: 12/16/2006  Job #: [id number]   cc:   Gaspar Garbe, M.D.

## 2011-04-24 NOTE — Discharge Summary (Signed)
[name], [name]               ACCOUNT NO.:  [id number]  MEDICAL RECORD NO.:  [id number]           PATIENT TYPE:  I  LOCATION:  4712                         FACILITY:  [facility]  PHYSICIAN:  Bevelyn Buckles. Bensimhon, MDDATE OF BIRTH:  1924/04/20  DATE OF ADMISSION:  04/07/2011 DATE OF DISCHARGE:  04/09/2011                              DISCHARGE SUMMARY   PRIMARY CARDIOLOGIST:  Bevelyn Buckles. Bensimhon, MD.  ELECTROPHYSIOLOGIST:  Doylene Canning. Ladona Ridgel, MD.  PRIMARY CARE PROVIDER:  Raenette Rover Felicity Coyer, MD.  DISCHARGE DIAGNOSIS:  Failure to thrive.  SECONDARY DIAGNOSES: 1. Acute-on-chronic systolic congestive heart failure/ischemic     cardiomyopathy. 2. Severe mitral regurgitation. 3. Paroxysmal atrial fibrillation, on chronic Coumadin therapy. 4. History of complete heart block, status post biventricular AICD. 5. Hypertension.  ALLERGIES:  PENICILLIN.  PROCEDURES:  None.  HISTORY OF PRESENT ILLNESS:  An 77 year old male with prior history of myocardial infarction and subsequent ischemic cardiomyopathy and chronic heart failure status post biventricular ICD change on December 2011, who presented to [name] on May 6 with a 2-week history of increasing fatigue and weakness as well as acute on chronic lower extremity edema. The patient was admitted for further evaluation.  HOSPITAL COURSE:  The patient was initially placed on IV Lasix with minimal change in weight from 69.9 kg on admission and 69.7 kg today and then negative of approximately 1 liter during this admission.  It is unclear as to what extent the heart failure was playing in the patient's symptoms versus failure to thrive and general decline.  A PICC line was placed and co-oximetry was measured as 56%.  Dr. Gala Romney had a long discussion with the patient today and felt that the patient's decline is likely a combination of both low output heart failure in the setting of advanced age.  At this point, the patient is going to  be discharged home with symptom monitoring over the next 2 weeks.  The patient will follow up with Dr. Gala Romney in approximately 2 weeks, at which point, depending on how the patient feels, decision will be made as to whether or not the patient will require right heart catheterization with consideration of milrinone therapy.  DISCHARGE LABS:  Hemoglobin 12.8, hematocrit 38.2, WBC 11.6, platelets 374.  INR 2.03.  Sodium 141, potassium 4.3, chloride 103, CO2 32, BUN 20, creatinine 0.78, glucose 102, total bilirubin 0.4, alkaline phosphatase 56, AST 32, ALT 29, total protein 6.9, albumin 3.4, calcium 9.7.  CK 363, MB 7.0, troponin-I less than 0.30.  BNP on admission was 1617.  TSH 1.646.  Urinalysis was negative.  Stool was negative for C. diff, Giardia, Cryptosporidium.  MRSA screen was negative.  Stool culture is pending.  (Stool studies drawn for soft stools).  DISPOSITION:  The patient will be discharged home today in good condition.  FOLLOWUP PLANS AND APPOINTMENTS:  The patient will follow up in [facility] on May 17 at 2:45 p.m.  He has arrangements to follow up with Dr. Gala Romney on May 25 at 2:15 p.m.  DISCHARGE MEDICATIONS: 1. Astelin nasal spray 137 mcg each nostril daily. 2. Clonazepam 0.5 mg half  to one tablet b.i.d. p.r.n. 3. Coumadin 5 mg one tablet on Tuesday, Wednesday, Friday, Saturday,     Sunday, half tablet on Monday and Thursday. 4. Doxazosin 4 mg daily. 5. Finasteride 5 mg daily. 6. Flomax 0.4 mg two tablets daily. 7. Fluticasone Diskus 50 mcg one spray daily p.r.n. 8. Lasix 40 mg one and a half tablet daily. 9. Lisinopril 10 mg half tablet daily. 10.Melatonin 3 mg 1 capsule at bedtime. 11.Mirtazapine 15 mg half tablet at bedtime. 12.Ocuvite 1 tablet daily. 13.Omeprazole 20 mg daily. 14.Potassium chloride 20 mEq one and a half tablets daily. 15.Pravastatin 40 mg at bedtime. 16.Sertraline 50 mg daily. 17.Systane eye drops  over-the-counter 0.4/0.3% one drop both eyes at     bedtime. 18.Vitamin D2 50,000 units every Friday. 19.Zyrtec 10 mg daily.  OUTSTANDING LAB STUDIES:  None.  DURATION OF DISCHARGE/ENCOUNTER:  45 minutes including physician time.     Nicolasa Ducking, ANP   ______________________________ Bevelyn Buckles. Bensimhon, MD    CB/MEDQ  D:  04/09/2011  T:  04/10/2011  Job:  [id number]  cc:   Vikki Ports A. Felicity Coyer, MD  Electronically Signed by Nicolasa Ducking ANP on 04/17/2011 01:12:25 PM Electronically Signed by Arvilla Meres MD on 04/24/2011 09:34:27 PM

## 2011-04-25 NOTE — H&P (Signed)
[name], [name]               ACCOUNT NO.:  [id number]  MEDICAL RECORD NO.:  [id number]           PATIENT TYPE:  O  LOCATION:  4712                         FACILITY:  [facility]  PHYSICIAN:  Doylene Canning. Ladona Ridgel, MD    DATE OF BIRTH:  1924-06-03  DATE OF ADMISSION:  04/07/2011 DATE OF DISCHARGE:                             HISTORY & PHYSICAL   ADMISSION DIAGNOSES:  Acute on chronic systolic heart failure and failure to thrive.  SECONDARY DIAGNOSES: 1. Coronary artery disease with ischemic cardiomyopathy, status post     myocardial infarction. 2. Severe mitral regurgitation. 3. Paroxysmal atrial fibrillation. 4. Complete heart block, status post biventricular implantable     cardioverter-defibrillator. 5. Hypertension.  HISTORY OF PRESENT ILLNESS:  The patient is a very pleasant elderly male with a history of an ischemic cardiomyopathy, complete heart block, status post myocardial infarction, and chronic congestive heart failure. He underwent BiV ICD generator change with pocket revision several months ago which is complicated by a large hematoma.  After approximately 3 months, this has resolved.  Initially, he did well, but over the last 2 weeks has noted increasing fatigue and weakness.  He denies fevers or chills.  He does have some extra peripheral edema.  He was seen in the office several days ago by Tereso Newcomer and his diuretic dose was increased.  He has had screening blood cultures which have been sterile.  The patient notes that he just has no energy.  He has trouble going up stairs.  He has peripheral edema.  He denies PND or orthopnea. He has had no syncope and denies ICD shocks.  He denies any redness or tenderness over his ICD insertion site.  PAST MEDICAL HISTORY:  As noted in the HPI.  There is also history of diverticulitis, history of retinal bleeding, history of osteoporosis, history of BPH, history of hypertension, and history of dyslipidemia.  FAMILY  HISTORY:  Negative for premature coronary artery disease.  SOCIAL HISTORY:  The patient lives with his wife.  He denies tobacco or ethanol abuse.  REVIEW OF SYSTEMS:  As noted in the HPI, otherwise all systems are reviewed and are negative except as noted above.  PHYSICAL EXAMINATION:  GENERAL:  He is a pleasant, frail elderly man in no acute distress. VITAL SIGNS:  Blood pressure was 124/77.  The pulse was 70 and regular. The respirations were 18.  The temperature was 98. HEENT:  Normocephalic and atraumatic.  Pupils equal and round. Oropharynx moist.  Sclerae anicteric. NECK:  No jugular venous distention.  There is no thyromegaly.  Trachea is midline.  Carotids are 2+ and symmetric. LUNGS:  Clear bilaterally to auscultation.  No wheezes, rales, or rhonchi are present.  There is no increased work of breathing. CEREBROVASCULAR:  Regular rate and rhythm with normal S1 and S2.  The PMI was enlarged and laterally displaced.  There was grade 2/6 systolic murmur heard best at the base of the heart. ABDOMEN:  Soft, nontender, and nondistended.  There is no organomegaly. Bowel sounds present.  There is no rebound or guarding.  ICD insertion site demonstrates no effusion.  There  is no tenderness or redness. EXTREMITIES:  1 to 2+ peripheral edema bilaterally.  There are no obvious lesions. NEUROLOGIC:  Alert and oriented x3 with cranial nerves intact.  Strength was 5/5 and symmetric.  EKG does demonstrate atrial fib with BiV pacing.  IMPRESSION: 1. Acute on chronic congestive heart failure. 2. Failure to thrive. 3. Ischemic cardiomyopathy and coronary artery disease. 4. Hypertension. 5. Complete heart block. 6. Dyslipidemia. 7. Paroxysmal atrial fibrillation. 8. Severe mitral regurgitation.  DISCUSSION:  We will admit the patient to the hospital for observation. We will give him IV Lasix.  We will check his labs including a sed rate and a CBC.  It is unclear to me what his primary  problem is.  I suspect he has a low output state (cool and wet), but he may have other problems going on.  We will have his device interrogated to see how long he has been in AFib and we will have him obtain a 2-D echo.     Doylene Canning. Ladona Ridgel, MD     GWT/MEDQ  D:  04/07/2011  T:  04/07/2011  Job:  [id number]  cc:   Vikki Ports A. Felicity Coyer, MD  Electronically Signed by Lewayne Bunting MD on 04/25/2011 12:52:33 PM

## 2011-04-26 ENCOUNTER — Encounter: Payer: Medicare Other | Admitting: *Deleted

## 2011-04-26 ENCOUNTER — Ambulatory Visit: Payer: Medicare Other | Admitting: Internal Medicine

## 2011-04-27 ENCOUNTER — Other Ambulatory Visit: Payer: Self-pay | Admitting: Internal Medicine

## 2011-04-30 ENCOUNTER — Encounter: Payer: Medicare Other | Admitting: *Deleted

## 2011-05-02 ENCOUNTER — Ambulatory Visit (INDEPENDENT_AMBULATORY_CARE_PROVIDER_SITE_OTHER): Payer: Medicare Other | Admitting: *Deleted

## 2011-05-02 DIAGNOSIS — I4891 Unspecified atrial fibrillation: Secondary | ICD-10-CM

## 2011-05-02 LAB — POCT INR: INR: 2

## 2011-05-13 ENCOUNTER — Ambulatory Visit (INDEPENDENT_AMBULATORY_CARE_PROVIDER_SITE_OTHER): Payer: Medicare Other | Admitting: Internal Medicine

## 2011-05-13 ENCOUNTER — Ambulatory Visit (INDEPENDENT_AMBULATORY_CARE_PROVIDER_SITE_OTHER): Payer: Medicare Other | Admitting: *Deleted

## 2011-05-13 ENCOUNTER — Encounter: Payer: Self-pay | Admitting: Internal Medicine

## 2011-05-13 VITALS — BP 122/72 | HR 76 | Ht 68.5 in | Wt 160.8 lb

## 2011-05-13 DIAGNOSIS — I4891 Unspecified atrial fibrillation: Secondary | ICD-10-CM

## 2011-05-13 DIAGNOSIS — I5022 Chronic systolic (congestive) heart failure: Secondary | ICD-10-CM

## 2011-05-13 DIAGNOSIS — I059 Rheumatic mitral valve disease, unspecified: Secondary | ICD-10-CM

## 2011-05-13 DIAGNOSIS — I2510 Atherosclerotic heart disease of native coronary artery without angina pectoris: Secondary | ICD-10-CM

## 2011-05-13 NOTE — Assessment & Plan Note (Signed)
MR is chronic. Not a surgical candidate. Continue current regimen.

## 2011-05-13 NOTE — Assessment & Plan Note (Signed)
Chronic. Failed attempts at cardioversion including with amio. Continue coumadin.

## 2011-05-13 NOTE — Assessment & Plan Note (Signed)
No evidence of ischemia. Continue current regimen.   

## 2011-05-13 NOTE — Patient Instructions (Signed)
Your physician recommends that you schedule a follow-up appointment in: 6-8 weeks  

## 2011-05-13 NOTE — Progress Notes (Signed)
History of Present Illness: Primary Cardiologist:  Dr. Arvilla Meres  [name] is a delightful 77 year old male with history congestive heart failure secondary to ischemic cardiomyopathy.  He has a totally occluded LAD on cardiac catheterization with nonobstructive disease elsewhere.  His EF previously in the 25-30% range. However echo 04/2010 showed EF 45% with mod-sev MR. He is status post BiV ICD.  He has also had problems with atrial fibrillation and undergone several cardioversions (and severeal anti-arrhhtymics) but now with chronic atrial fib.  He has class 2 symptoms.  In May 2012 admitted with failure to thrive symptoms. PICC line placed sand co-ox was 56%. We discussed possible trial of milrinone but decided to take a watch and wait approach. Returns today for routine f/u/    Feels much better. Able to go out with family. Breathing well. No CP, no fevers chills. Weight down 5 pounds. No orthopnea or PND.  Family encouraging him to eat more. Last week took extra dose lasix of due to swelling.   All other systems normal except as listed in the HPI and Problem List.     Past Medical History  Diagnosis Date  . Systolic CHF, chronic   . Ischemic cardiomyopathy     a.  echo 8/08: EF 20-30%;  b. echo 5/11: EF 45%, mod to severe MR  . Permanent atrial fibrillation     Amiodarone stopped due to recurrent AFib  . CAD (coronary artery disease)     a. cath 9/06: occluded LAD with L-L and R-L collaterals; LM 20%, CFX 20%, OM1 30%, RCA 20-30%, EF 45%  . Mitral regurgitation     a. mod to severe by echo 5/11  . Anemia     2/2 GI bleed  . Symptomatic bradycardia     a. s/p BiV-ICD;  b. s/p change to BiV pacer only 12/11  . HTN (hypertension)   . HLD (hyperlipidemia)   . Diverticulitis   . Osteoporosis   . BPH (benign prostatic hypertrophy)   . Hiatal hernia   . Glaucoma   . Urinary retention   . Depression   . Cataracts, bilateral     Retinal bleeding after cataract removal  .  Glaucoma   . Hiatal hernia     Current Outpatient Prescriptions  Medication Sig Dispense Refill  . azelastine (ASTELIN) 137 MCG/SPRAY nasal spray 1 spray by Nasal route daily. Use in each nostril as directed       . cetirizine (ZYRTEC) 10 MG tablet Take 10 mg by mouth daily.        . clonazePAM (KLONOPIN) 0.5 MG tablet Take 0.5 mg by mouth 2 (two) times daily as needed.        . doxazosin (CARDURA) 4 MG tablet Take 4 mg by mouth at bedtime.        . finasteride (PROSCAR) 5 MG tablet Take 5 mg by mouth daily.        . fluticasone (FLONASE) 50 MCG/ACT nasal spray 1 SPRAY EACH NOSTRIL EVERY MORNING  16 g  2  . furosemide (LASIX) 40 MG tablet Take 40 mg by mouth daily.        Marland Kitchen lisinopril (PRINIVIL,ZESTRIL) 10 MG tablet Take 5 mg by mouth daily.        . Melatonin 3 MG CAPS Take by mouth at bedtime.        . mirtazapine (REMERON) 15 MG tablet Take 15 mg by mouth at bedtime as needed.        Marland Kitchen  Multiple Vitamins-Minerals (OCUVITE PO) Take by mouth daily.        Marland Kitchen omeprazole (PRILOSEC) 20 MG capsule       . Polyethyl Glycol-Propyl Glycol (SYSTANE) 0.4-0.3 % GEL Apply to eye at bedtime as needed.        . potassium chloride SA (K-DUR,KLOR-CON) 20 MEQ tablet Take 20 mEq by mouth daily.        . pravastatin (PRAVACHOL) 40 MG tablet Take 40 mg by mouth daily.        . sertraline (ZOLOFT) 50 MG tablet Take 50 mg by mouth daily.        . Vitamin D, Ergocalciferol, (DRISDOL) 50000 UNITS CAPS TAKE 1 CAPSULE EVERY FRIDAY  12 capsule  2  . warfarin (COUMADIN) 5 MG tablet Take as directed by Anticoagulation clinic   35 tablet  3  . DISCONTD: furosemide (LASIX) 40 MG tablet Take 1.5 tablets (60 mg total) by mouth daily.  45 tablet  6  . DISCONTD: potassium chloride SA (K-DUR,KLOR-CON) 20 MEQ tablet Take 1.5 tablets (30 mEq total) by mouth daily.  45 tablet  6  . Tamsulosin HCl (FLOMAX) 0.4 MG CAPS Take 0.8 mg by mouth daily.         Allergies  Allergen Reactions  . Penicillins     History  Substance  Use Topics  . Smoking status: Never Smoker   . Smokeless tobacco: Not on file   Comment: Married lives with wife, lives [facility]. 3 children involved in care-bro in [place], 2 sis in town  . Alcohol Use: No     Vital Signs: BP 122/72  Pulse 76  Ht 5' 8.5" (1.74 m)  Wt 160 lb 12.8 oz (72.938 kg)  BMI 24.09 kg/m2  PHYSICAL EXAM: Elderly kyphotic in no acute distress HEENT: normal Neck: JVP flat. Carotids 2+ bilaterally Cardiac:  normal S1, S2; RRR; 3/6 holosystolic murmur heard best at the apex; No S3 Lungs:  Course breath sounds at the bases bilaterally, no wheezing, rhonchi or rales Abd: soft, nontender, no hepatomegaly Ext: 2+ bilateral ankle edema. No pretibial edema.  Skin: warm and dry;  chest/upper abdomen, no erythema Neuro:  CNs 2-12 intact, no focal abnormalities noted Psych: Normal affect  ECG:  V-pacing 76. Underlying AF.   ASSESSMENT AND PLAN:

## 2011-05-13 NOTE — Assessment & Plan Note (Signed)
Doing very well. NYHA III. Stable volume status. Will continue current regimen. We once again discussed option of home inotropes should he decline again.

## 2011-06-06 IMAGING — CR DG CHEST 2V
2 series · 2 of 2 positions shown · non-contrast
Comparison: 04/02/2011

CLINICAL DATA: Chest pain.  Not feeling well for the past couple of
weeks.  Productive cough.  Weakness.  Pacemaker placed 5 months
ago.  Shortness of breath.  Atrial fibrillation.  History of
hypertension, scoliosis.

CHEST - 2 VIEW

[w chest pa]
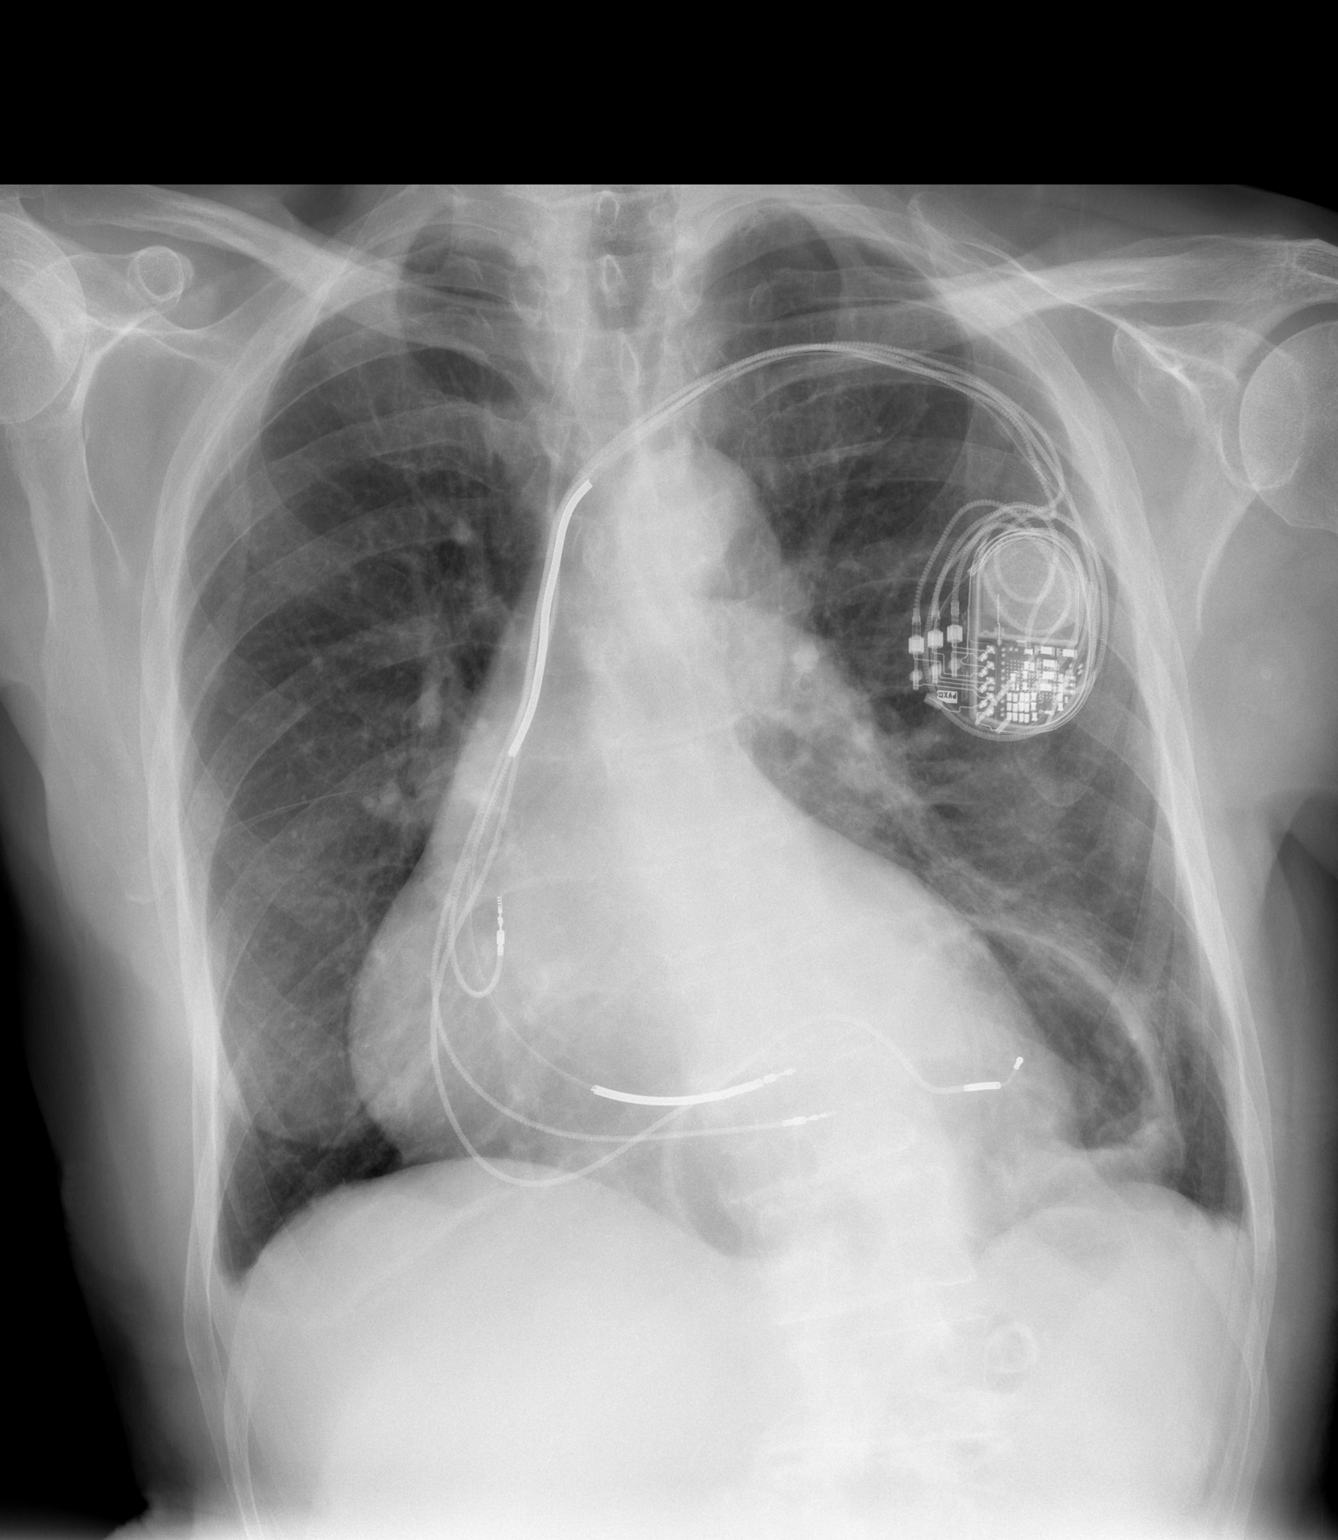

[w chest lat]
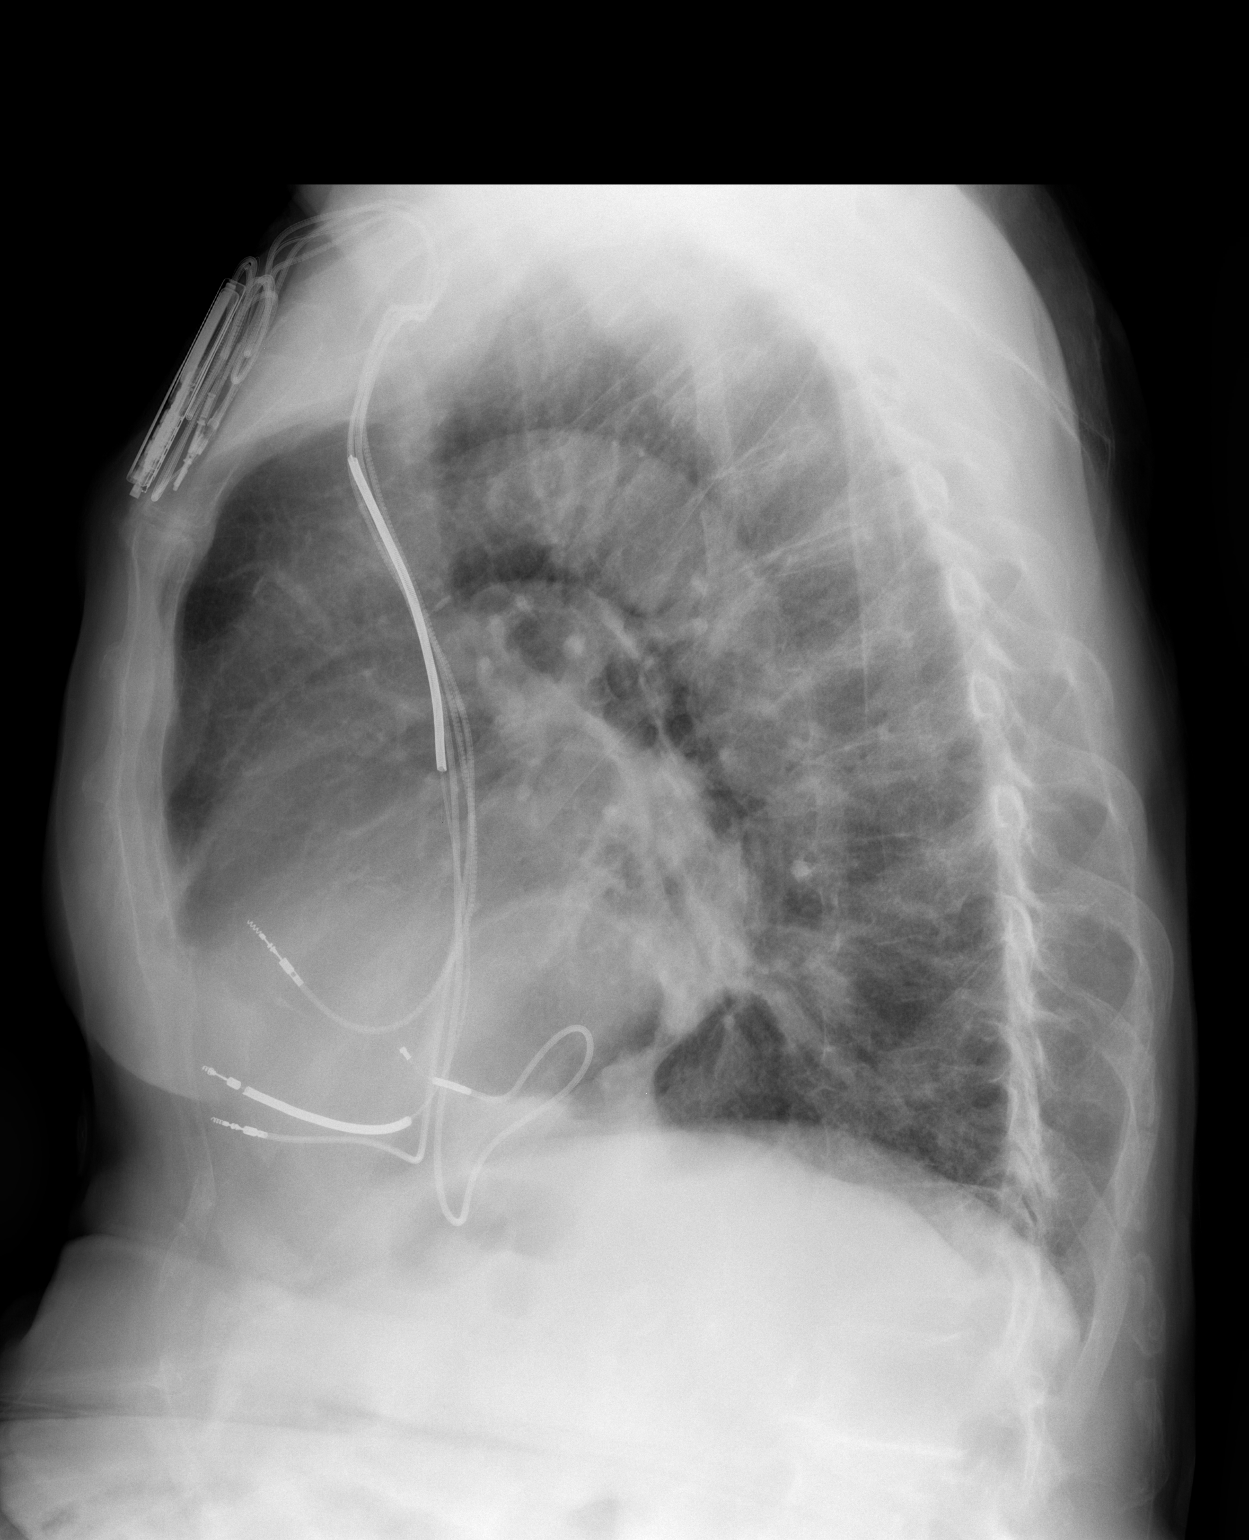

[2 of 2 positions shown; findings below may reference images not displayed]

FINDINGS: Patient has left-sided pacemaker / AICD.  Leads overlie
the right atrium, right ventricle, and coronary sinus.  The heart
is markedly enlarged but stable in appearance.  Large hiatal hernia
is present.  There is no pulmonary edema.  Small right pleural
effusion is present.  There are no focal consolidations.  There is
scoliosis of the thoracolumbar spine, S-shaped.  Degenerative
changes are present.
IMPRESSION: 1.  Marked cardiomegaly, stable in appearance.
2.  Large hiatal hernia.
3.  No evidence for edema.

## 2011-06-10 ENCOUNTER — Ambulatory Visit (INDEPENDENT_AMBULATORY_CARE_PROVIDER_SITE_OTHER): Payer: Medicare Other | Admitting: *Deleted

## 2011-06-10 DIAGNOSIS — I4891 Unspecified atrial fibrillation: Secondary | ICD-10-CM

## 2011-06-20 ENCOUNTER — Ambulatory Visit (INDEPENDENT_AMBULATORY_CARE_PROVIDER_SITE_OTHER): Payer: Medicare Other | Admitting: *Deleted

## 2011-06-20 DIAGNOSIS — I498 Other specified cardiac arrhythmias: Secondary | ICD-10-CM

## 2011-06-20 DIAGNOSIS — I5022 Chronic systolic (congestive) heart failure: Secondary | ICD-10-CM

## 2011-06-20 DIAGNOSIS — R001 Bradycardia, unspecified: Secondary | ICD-10-CM

## 2011-06-20 DIAGNOSIS — I4891 Unspecified atrial fibrillation: Secondary | ICD-10-CM

## 2011-06-20 DIAGNOSIS — Z950 Presence of cardiac pacemaker: Secondary | ICD-10-CM

## 2011-06-23 ENCOUNTER — Other Ambulatory Visit: Payer: Self-pay | Admitting: Internal Medicine

## 2011-06-24 LAB — REMOTE PACEMAKER DEVICE
AL AMPLITUDE: 0.4 mv
ATRIAL PACING PM: 0
BRDY-0002RV: 75 {beats}/min
VENTRICULAR PACING PM: 96.23

## 2011-06-26 NOTE — Progress Notes (Signed)
Pacer remote w/icm  

## 2011-06-28 ENCOUNTER — Encounter: Payer: Self-pay | Admitting: Internal Medicine

## 2011-07-03 ENCOUNTER — Ambulatory Visit: Payer: Medicare Other | Admitting: Internal Medicine

## 2011-07-03 ENCOUNTER — Encounter: Payer: Self-pay | Admitting: Internal Medicine

## 2011-07-03 ENCOUNTER — Other Ambulatory Visit (INDEPENDENT_AMBULATORY_CARE_PROVIDER_SITE_OTHER): Payer: Medicare Other

## 2011-07-03 ENCOUNTER — Ambulatory Visit (INDEPENDENT_AMBULATORY_CARE_PROVIDER_SITE_OTHER): Payer: Medicare Other | Admitting: Internal Medicine

## 2011-07-03 VITALS — BP 110/72 | HR 90 | Temp 97.9°F | Ht 68.0 in | Wt 160.0 lb

## 2011-07-03 DIAGNOSIS — R197 Diarrhea, unspecified: Secondary | ICD-10-CM

## 2011-07-03 LAB — BASIC METABOLIC PANEL
BUN: 23 mg/dL (ref 6–23)
CO2: 28 mEq/L (ref 19–32)
Calcium: 9 mg/dL (ref 8.4–10.5)
Creatinine, Ser: 0.9 mg/dL (ref 0.4–1.5)
GFR: 82.1 mL/min (ref >60.00)
Glucose, Bld: 121 mg/dL — ABNORMAL HIGH (ref 70–99)
Sodium: 140 mEq/L (ref 135–145)

## 2011-07-03 LAB — CBC WITH DIFFERENTIAL/PLATELET
Basophils Relative: 0.6 % (ref 0.0–3.0)
Eosinophils Relative: 1.6 % (ref 0.0–5.0)
HCT: 37.2 % — ABNORMAL LOW (ref 39.0–52.0)
Hemoglobin: 12.5 g/dL — ABNORMAL LOW (ref 13.0–17.0)
Lymphs Abs: 1.7 10*3/uL (ref 0.7–4.0)
MCV: 100.1 fl — ABNORMAL HIGH (ref 78.0–100.0)
Monocytes Absolute: 0.7 10*3/uL (ref 0.1–1.0)
Monocytes Relative: 13.4 % — ABNORMAL HIGH (ref 3.0–12.0)
Neutro Abs: 2.9 10*3/uL (ref 1.4–7.7)
Platelets: 212 10*3/uL (ref 150.0–400.0)
WBC: 5.5 10*3/uL (ref 4.5–10.5)

## 2011-07-03 NOTE — Patient Instructions (Addendum)
It was good to see you today. We have reviewed your prior records including labs and tests today Test(s) ordered today to evaluate diarrhea. Your results will be called to you after review (48-72hours after test completion). If any changes need to be made, you will be notified at that time. Medications reviewed, no changes at this time. Please schedule followup in 6 months for review, call sooner if problems.

## 2011-07-03 NOTE — Progress Notes (Signed)
Subjective:    Patient ID: [name], male    DOB: [birth year], 77 y.o.   MRN: [id number]  HPI complains of "stomach upset" (diarrhea) Onset 2 weeks ago associated with incontince of stools due to urgency Improved in past 36h with otc immodium use Denies melena, fever, abd pain, nausea and vomiting or diet changes +sick contacts at ALF  Also reviewed chronic medical issues:   dyslipidemia - on statin -reports compliance with ongoing medical treatment and no changes in medication dose or frequency.  denies adverse side effects related to current therapy. no GI or muscle c/o   HTN - reports compliance with ongoing medical treatment and no changes in medication dose or frequency.  denies adverse side effects related to current therapy. no headache or vision changes, no weakness   chronic syst CHF - last hosp 04/2011 reviewed - follows regularly with cards for same -reports compliance with ongoing medical treatment and no changes in medication dose or frequency. denies adverse side effects related to current therapy. weight stable - no swelling of legs or SOB   PAF -  off amio, rate control only - chronic coumadin with [facility] CC. no bleeding since GIB; no change in bruising -   depression - improved energy and fatigue since starting zoloft 10/2010 - also sleeping better with remeron -   Past Medical History  Diagnosis Date  . Systolic CHF, chronic   . Ischemic cardiomyopathy     a.  echo 8/08: EF 20-30%;  b. echo 5/11: EF 45%, mod to severe MR  . Permanent atrial fibrillation     Amiodarone stopped due to recurrent AFib  . CAD (coronary artery disease)     a. cath 9/06: occluded LAD with L-L and R-L collaterals; LM 20%, CFX 20%, OM1 30%, RCA 20-30%, EF 45%  . Mitral regurgitation     a. mod to severe by echo 5/11  . Anemia     2/2 GI bleed  . Symptomatic bradycardia     a. s/p BiV-ICD;  b. s/p change to BiV pacer only 12/11  . HTN (hypertension)   . HLD (hyperlipidemia)   .  Osteoporosis   . BPH (benign prostatic hypertrophy)   . Glaucoma   . Urinary retention   . Depression   . Cataracts, bilateral     Retinal bleeding after cataract removal  . Glaucoma   . Hiatal hernia     Review of Systems  Constitutional: Negative for fever and unexpected weight change.  Cardiovascular: Negative for chest pain and palpitations.      Objective:   Physical Exam BP 110/72  Pulse 90  Temp(Src) 97.9 F (36.6 C) (Oral)  Ht 5\' 8"  (1.727 m)  Wt 160 lb (72.576 kg)  BMI 24.33 kg/m2  SpO2 95%  Constitutional: oriented to person, place, and time. appears well-developed and well-nourished. No distress.  Wife and dtr at side Cardiovascular: Normal rate, regular rhythm and normal heart sounds.  No murmur heard. chronic 1+ edema BLE Pulmonary/Chest: Effort normal and breath sounds normal. No respiratory distress. no wheezes.  Abdominal: Soft. Bowel sounds are normal. Patient exhibits no distension. There is no tenderness.  Neurological: he is alert and oriented to person, place, and time. No cranial nerve deficit. Coordination normal.  Psychiatric: he has a normal mood and affect. behavior is normal. Judgment and thought content normal.   Lab Results  Component Value Date   WBC 11.6* 04/09/2011   HGB 12.8* 04/09/2011   HCT 38.2* 04/09/2011  PLT 374 04/09/2011   CHOL 154 01/09/2011   TRIG 100.0 01/09/2011   HDL 61.50 01/09/2011   ALT 29 04/08/2011   AST 32 04/08/2011   NA 141 04/09/2011   K 4.3 04/09/2011   CL 103 04/09/2011   CREATININE 0.78 04/09/2011   BUN 20 04/09/2011   CO2 32 04/09/2011   TSH 1.646 04/08/2011   INR 2.1 06/10/2011      Assessment & Plan:  Diarrhea - ongoing x 2 weeks - no fever and nontoxic on exam; +sick contacts as well as institutional apt - chck routine labs as well as C diff and celiac dz (chronically loose per wife, although worse in past 2 weeks) - ok to use OTC immodium as ongoing unless other med abn found  Also see problem list. Medications and labs reviewed  today.

## 2011-07-04 ENCOUNTER — Encounter: Payer: Self-pay | Admitting: Internal Medicine

## 2011-07-04 ENCOUNTER — Other Ambulatory Visit: Payer: Medicare Other

## 2011-07-04 ENCOUNTER — Ambulatory Visit (INDEPENDENT_AMBULATORY_CARE_PROVIDER_SITE_OTHER): Payer: Medicare Other | Admitting: Internal Medicine

## 2011-07-04 VITALS — BP 110/70 | HR 80 | Resp 18 | Ht 68.0 in | Wt 159.0 lb

## 2011-07-04 DIAGNOSIS — I4891 Unspecified atrial fibrillation: Secondary | ICD-10-CM

## 2011-07-04 DIAGNOSIS — I5022 Chronic systolic (congestive) heart failure: Secondary | ICD-10-CM

## 2011-07-04 DIAGNOSIS — I10 Essential (primary) hypertension: Secondary | ICD-10-CM

## 2011-07-04 DIAGNOSIS — R197 Diarrhea, unspecified: Secondary | ICD-10-CM

## 2011-07-04 DIAGNOSIS — I2510 Atherosclerotic heart disease of native coronary artery without angina pectoris: Secondary | ICD-10-CM

## 2011-07-04 LAB — HEPATIC FUNCTION PANEL
Alkaline Phosphatase: 44 U/L (ref 39–117)
Bilirubin, Direct: 0.1 mg/dL (ref 0.0–0.3)

## 2011-07-04 NOTE — Progress Notes (Signed)
History of Present Illness: Primary Cardiologist:  Dr. Arvilla Meres  [name] is a delightful 77 year old male with history congestive heart failure secondary to ischemic cardiomyopathy.  He has a totally occluded LAD on cardiac catheterization with nonobstructive disease elsewhere.  His EF previously in the 25-30% range. However echo 04/2010 showed EF 45% with mod-sev MR. He is status post BiV ICD.  He has also had problems with atrial fibrillation and undergone several cardioversions (and severeal anti-arrhhtymics) but now with chronic atrial fib.  He has class 2 symptoms.  In May 2012 admitted with failure to thrive symptoms. PICC line placed sand co-ox was 56%. We discussed possible trial of milrinone but decided to take a watch and wait approach. He followed up in June and no changes were made to patient's medical regimen.    He continues to feel well.  He is able to walk 3 blocks without difficulty.  Weight remains stable.  He has had diarrhea over the last two weeks but this is improving gradually over the last 6 days.  Denies f/c/n/v.  Denies orthopnea or PND or lower extremity edema.  He takes his BP at home daily, sometimes twice a day with SBP 110-130.   All other systems normal except as listed in the HPI and Problem List.     Past Medical History  Diagnosis Date  . Systolic CHF, chronic   . Ischemic cardiomyopathy     a.  echo 8/08: EF 20-30%;  b. echo 5/11: EF 45%, mod to severe MR  . Permanent atrial fibrillation     Amiodarone stopped due to recurrent AFib  . CAD (coronary artery disease)     a. cath 9/06: occluded LAD with L-L and R-L collaterals; LM 20%, CFX 20%, OM1 30%, RCA 20-30%, EF 45%  . Mitral regurgitation     a. mod to severe by echo 5/11  . Anemia     2/2 GI bleed  . Symptomatic bradycardia     a. s/p BiV-ICD;  b. s/p change to BiV pacer only 12/11  . HTN (hypertension)   . HLD (hyperlipidemia)   . Osteoporosis   . BPH (benign prostatic hypertrophy)     . Glaucoma   . Urinary retention   . Depression   . Cataracts, bilateral     Retinal bleeding after cataract removal  . Glaucoma   . Hiatal hernia     Current Outpatient Prescriptions  Medication Sig Dispense Refill  . azelastine (ASTELIN) 137 MCG/SPRAY nasal spray 1 spray by Nasal route daily. Use in each nostril as directed       . cetirizine (ZYRTEC) 10 MG tablet Take 10 mg by mouth daily.        . clonazePAM (KLONOPIN) 0.5 MG tablet Take 0.5 mg by mouth 2 (two) times daily as needed.        . doxazosin (CARDURA) 4 MG tablet Take 4 mg by mouth at bedtime.        . finasteride (PROSCAR) 5 MG tablet Take 5 mg by mouth daily.        . fluticasone (FLONASE) 50 MCG/ACT nasal spray 1 SPRAY EACH NOSTRIL EVERY MORNING  16 g  2  . furosemide (LASIX) 40 MG tablet Take 40 mg by mouth daily.        Marland Kitchen lisinopril (PRINIVIL,ZESTRIL) 10 MG tablet Take 5 mg by mouth daily.        . Melatonin 3 MG CAPS Take by mouth at bedtime.        Marland Kitchen  mirtazapine (REMERON) 15 MG tablet Take 15 mg by mouth at bedtime as needed.        . Multiple Vitamins-Minerals (OCUVITE PO) Take by mouth daily.        Marland Kitchen omeprazole (PRILOSEC) 20 MG capsule       . Polyethyl Glycol-Propyl Glycol (SYSTANE) 0.4-0.3 % GEL Apply to eye at bedtime as needed.        . potassium chloride SA (K-DUR,KLOR-CON) 20 MEQ tablet Take 20 mEq by mouth daily.        . pravastatin (PRAVACHOL) 40 MG tablet Take 40 mg by mouth daily.        . sertraline (ZOLOFT) 50 MG tablet Take 50 mg by mouth daily.        . Tamsulosin HCl (FLOMAX) 0.4 MG CAPS Take 0.8 mg by mouth daily.       . Vitamin D, Ergocalciferol, (DRISDOL) 50000 UNITS CAPS TAKE 1 CAPSULE EVERY FRIDAY  12 capsule  2  . warfarin (COUMADIN) 5 MG tablet Take as directed by Anticoagulation clinic   35 tablet  3    Allergies  Allergen Reactions  . Penicillins     History  Substance Use Topics  . Smoking status: Never Smoker   . Smokeless tobacco: Not on file   Comment: Married lives  with wife, lives [facility]. 3 children involved in care-bro in [place], 2 sis in town  . Alcohol Use: No     Vital Signs: BP 110/70  Pulse 80  Resp 18  Ht 5\' 8"  (1.727 m)  Wt 159 lb (72.122 kg)  BMI 24.18 kg/m2  PHYSICAL EXAM: Elderly kyphotic in no acute distress HEENT: normal Neck: JVP 6-7. Carotids 2+ bilaterally Cardiac:  normal S1, S2; RRR; 3/6 holosystolic murmur heard best at the apex; No S3 Lungs:  Course breath sounds at the bases bilaterally, no wheezing, rhonchi or rales Abd: soft, nontender, no hepatomegaly Ext: 1-2+ bilateral ankle edema. No pretibial edema.  Skin: warm and dry;  chest/upper abdomen, no erythema Neuro:  CNs 2-12 intact, no focal abnormalities noted Psych: Normal affect  ECG:  V-pacing 80. Underlying AF.   ASSESSMENT AND PLAN:

## 2011-07-04 NOTE — Assessment & Plan Note (Signed)
Chronic. Has failed amiodarone and multiple DC-CVs in past. Continue coumadin.

## 2011-07-04 NOTE — Assessment & Plan Note (Signed)
Stable  Continue current regimen  

## 2011-07-04 NOTE — Patient Instructions (Signed)
Your physician recommends that you schedule a follow-up appointment in: 2 months in [facility] Failure [facility]

## 2011-07-04 NOTE — Assessment & Plan Note (Signed)
Stable, no ischemic symptoms. Continue current regimen.

## 2011-07-04 NOTE — Assessment & Plan Note (Addendum)
Stable NYHA II-III. Overall doing well.  Volume status perhaps slightly elevated but weight on low end of his range. Will check Optivol to further evaluate. If Optivol up can give extra dose of lasix. Continue current regimen, no desire for inotropes at this time.  BiVICD followed by Dr. Ladona Ridgel.

## 2011-07-05 ENCOUNTER — Other Ambulatory Visit: Payer: Self-pay | Admitting: Internal Medicine

## 2011-07-05 ENCOUNTER — Encounter: Payer: Self-pay | Admitting: *Deleted

## 2011-07-08 ENCOUNTER — Ambulatory Visit: Payer: Medicare Other | Admitting: Internal Medicine

## 2011-07-09 ENCOUNTER — Ambulatory Visit (INDEPENDENT_AMBULATORY_CARE_PROVIDER_SITE_OTHER): Payer: Self-pay | Admitting: Cardiology

## 2011-07-09 ENCOUNTER — Encounter: Payer: Self-pay | Admitting: *Deleted

## 2011-07-09 DIAGNOSIS — R0989 Other specified symptoms and signs involving the circulatory and respiratory systems: Secondary | ICD-10-CM

## 2011-08-06 ENCOUNTER — Ambulatory Visit (INDEPENDENT_AMBULATORY_CARE_PROVIDER_SITE_OTHER): Payer: Self-pay | Admitting: Cardiology

## 2011-08-06 DIAGNOSIS — R0989 Other specified symptoms and signs involving the circulatory and respiratory systems: Secondary | ICD-10-CM

## 2011-08-22 ENCOUNTER — Other Ambulatory Visit: Payer: Self-pay | Admitting: Internal Medicine

## 2011-08-26 LAB — CBC
MCHC: 34.6
MCV: 100.6 — ABNORMAL HIGH
Platelets: 181
RDW: 14

## 2011-08-26 LAB — BASIC METABOLIC PANEL
BUN: 25 — ABNORMAL HIGH
CO2: 28
Chloride: 100
Creatinine, Ser: 1.23

## 2011-08-26 LAB — PROTIME-INR: Prothrombin Time: 36 — ABNORMAL HIGH

## 2011-09-03 ENCOUNTER — Ambulatory Visit (INDEPENDENT_AMBULATORY_CARE_PROVIDER_SITE_OTHER): Payer: Self-pay | Admitting: Internal Medicine

## 2011-09-03 DIAGNOSIS — R0989 Other specified symptoms and signs involving the circulatory and respiratory systems: Secondary | ICD-10-CM

## 2011-09-03 LAB — POCT INR: INR: 2.4

## 2011-09-05 ENCOUNTER — Encounter (HOSPITAL_COMMUNITY): Payer: Medicare Other

## 2011-09-06 LAB — URINALYSIS, ROUTINE W REFLEX MICROSCOPIC
Glucose, UA: NEGATIVE mg/dL
Ketones, ur: NEGATIVE mg/dL
Nitrite: NEGATIVE
Protein, ur: NEGATIVE mg/dL

## 2011-09-06 LAB — BASIC METABOLIC PANEL
BUN: 23 mg/dL (ref 6–23)
Calcium: 9.6 mg/dL (ref 8.4–10.5)
Creatinine, Ser: 1.1 mg/dL (ref 0.4–1.5)
GFR calc Af Amer: >60 mL/min (ref >60)
GFR calc non Af Amer: >60 mL/min (ref >60)

## 2011-09-12 ENCOUNTER — Encounter (HOSPITAL_COMMUNITY): Payer: Medicare Other

## 2011-09-21 ENCOUNTER — Other Ambulatory Visit: Payer: Self-pay | Admitting: Internal Medicine

## 2011-09-26 ENCOUNTER — Ambulatory Visit (HOSPITAL_COMMUNITY)
Admission: RE | Admit: 2011-09-26 | Discharge: 2011-09-26 | Disposition: A | Discharge status: Home / Self Care | Payer: Medicare Other | Source: Ambulatory Visit | Attending: Internal Medicine | Admitting: Internal Medicine

## 2011-09-26 ENCOUNTER — Encounter: Payer: Medicare Other | Admitting: *Deleted

## 2011-09-26 VITALS — BP 104/58 | HR 85 | Wt 160.0 lb

## 2011-09-26 DIAGNOSIS — I5022 Chronic systolic (congestive) heart failure: Secondary | ICD-10-CM | POA: Insufficient documentation

## 2011-09-26 NOTE — Progress Notes (Signed)
History of Present Illness: Primary Cardiologist:  Dr. Arvilla Meres  [name] is a delightful 77 year old male with history congestive heart failure secondary to ischemic cardiomyopathy.  He has a totally occluded LAD on cardiac catheterization with nonobstructive disease elsewhere.  His EF previously in the 25-30% range. However echo 04/2010 showed EF 45% with mod-sev MR. He is status post BiV ICD.  He has also had problems with atrial fibrillation and undergone several cardioversions (and severeal anti-arrhhtymics) but now with chronic atrial fib.  He has class 2 symptoms.  In May 2012 admitted with failure to thrive symptoms. PICC line placed and co-ox was 56%. We discussed possible trial of milrinone but decided to take a watch and wait approach. Returns today for routine f/u  Continue to do fairly well. Good days and bad days. Walking with walker. Weight very stable. Very compliant with meds. Not orthopnea, PND or CP. Mild exertional dyspnea. Occasional ankle edema.  All other systems normal except as listed in the HPI and Problem List.     Past Medical History  Diagnosis Date  . Systolic CHF, chronic   . Ischemic cardiomyopathy     a.  echo 8/08: EF 20-30%;  b. echo 5/11: EF 45%, mod to severe MR  . Permanent atrial fibrillation     Amiodarone stopped due to recurrent AFib  . CAD (coronary artery disease)     a. cath 9/06: occluded LAD with L-L and R-L collaterals; LM 20%, CFX 20%, OM1 30%, RCA 20-30%, EF 45%  . Mitral regurgitation     a. mod to severe by echo 5/11  . Anemia     2/2 GI bleed  . Symptomatic bradycardia     a. s/p BiV-ICD;  b. s/p change to BiV pacer only 12/11  . HTN (hypertension)   . HLD (hyperlipidemia)   . Osteoporosis   . BPH (benign prostatic hypertrophy)   . Glaucoma   . Urinary retention   . Depression   . Cataracts, bilateral     Retinal bleeding after cataract removal  . Glaucoma   . Hiatal hernia     Current Outpatient Prescriptions    Medication Sig Dispense Refill  . azelastine (ASTELIN) 137 MCG/SPRAY nasal spray 1 SPRAY EACH NOSTRIL EVERY MORNING  30 mL  2  . cetirizine (ZYRTEC) 10 MG tablet Take 10 mg by mouth daily.        . clonazePAM (KLONOPIN) 0.5 MG tablet Take 0.5 mg by mouth 2 (two) times daily as needed.        . doxazosin (CARDURA) 4 MG tablet Take 4 mg by mouth at bedtime.        . finasteride (PROSCAR) 5 MG tablet Take 5 mg by mouth daily.        . fluticasone (FLONASE) 50 MCG/ACT nasal spray 1 SPRAY EACH NOSTRIL EVERY MORNING  16 g  2  . furosemide (LASIX) 40 MG tablet Take 40 mg by mouth daily.        Marland Kitchen KLOR-CON M20 20 MEQ tablet TAKE 1 TABLET BY MOUTH EVERY DAY  30 tablet  8  . lisinopril (PRINIVIL,ZESTRIL) 10 MG tablet Take 5 mg by mouth daily.        . Melatonin 3 MG CAPS Take by mouth at bedtime.        . mirtazapine (REMERON) 15 MG tablet Take 15 mg by mouth at bedtime as needed.        . Multiple Vitamins-Minerals (OCUVITE PO) Take by mouth daily.        Marland Kitchen  omeprazole (PRILOSEC) 20 MG capsule       . Polyethyl Glycol-Propyl Glycol (SYSTANE) 0.4-0.3 % GEL Apply to eye at bedtime as needed.        . pravastatin (PRAVACHOL) 40 MG tablet Take 40 mg by mouth daily.        . sertraline (ZOLOFT) 50 MG tablet TAKE 1 TABLET EVERY DAY  30 tablet  6  . Tamsulosin HCl (FLOMAX) 0.4 MG CAPS Take 0.8 mg by mouth daily.       . Vitamin D, Ergocalciferol, (DRISDOL) 50000 UNITS CAPS TAKE 1 CAPSULE EVERY FRIDAY  12 capsule  2  . warfarin (COUMADIN) 5 MG tablet TAKE AS DIRECTED BY ANTICOAGULATION CLINIC  35 tablet  3    Allergies  Allergen Reactions  . Penicillins     History  Substance Use Topics  . Smoking status: Never Smoker   . Smokeless tobacco: Not on file   Comment: Married lives with wife, lives at [facility]. 3 children involved in care-bro in [place], 2 sis in town  . Alcohol Use: No     Vital Signs: BP 104/58  Pulse 85  Wt 160 lb (72.576 kg)  SpO2 97%  PHYSICAL  EXAM: Elderly kyphotic in no acute distress HEENT: normal Neck: JVP flat. Carotids 2+ bilaterally Cardiac:  normal S1, S2; RRR; 3/6 holosystolic murmur heard best at the apex; No S3 Lungs:  Course breath sounds at the bases bilaterally, no wheezing, rhonchi or rales Abd: soft, nontender, no hepatomegaly Ext: 2+ bilateral ankle edema. No pretibial edema.  Skin: warm and dry;  chest/upper abdomen, no erythema Neuro:  CNs 2-12 intact, no focal abnormalities noted Psych: Normal affect    ASSESSMENT AND PLAN:

## 2011-09-26 NOTE — Patient Instructions (Signed)
Your physician has requested that you have an echocardiogram. Echocardiography is a painless test that uses sound waves to create images of your heart. It provides your doctor with information about the size and shape of your heart and how well your heart's chambers and valves are working. This procedure takes approximately one hour. There are no restrictions for this procedure.  NEEDS IN 2 MONTHS.  Your physician recommends that you schedule a follow-up appointment in: 2 months.

## 2011-09-29 NOTE — Assessment & Plan Note (Signed)
Stable NYHA III. Volume status looks good. (has stable chronic ankle edema). Very compliant with meds and daily weights. Will continue current therapy.

## 2011-09-29 NOTE — Assessment & Plan Note (Signed)
Doing well with rate control. Continue coumadin. Has failed extensive efforts at rhythm control in past.

## 2011-09-29 NOTE — Assessment & Plan Note (Signed)
No evidence of ischemia. Continue current regimen.   

## 2011-10-01 ENCOUNTER — Ambulatory Visit (INDEPENDENT_AMBULATORY_CARE_PROVIDER_SITE_OTHER): Payer: Medicare Other | Admitting: Cardiology

## 2011-10-01 DIAGNOSIS — I4891 Unspecified atrial fibrillation: Secondary | ICD-10-CM

## 2011-10-01 DIAGNOSIS — Z7901 Long term (current) use of anticoagulants: Secondary | ICD-10-CM

## 2011-10-01 LAB — POCT INR: INR: 1.7

## 2011-10-03 ENCOUNTER — Encounter: Payer: Self-pay | Admitting: *Deleted

## 2011-10-11 ENCOUNTER — Telehealth: Payer: Self-pay | Admitting: Internal Medicine

## 2011-10-11 MED ORDER — FUROSEMIDE 40 MG PO TABS: 40.0000 mg | ORAL_TABLET | Freq: Every day | ORAL | Status: DC

## 2011-10-11 NOTE — Telephone Encounter (Signed)
Rx refill sent to [facility] per pt request

## 2011-10-11 NOTE — Telephone Encounter (Signed)
New message:  Pt's wife states sometimes he has to take extra Lasix and the prescription is going to run out before the month is out.  Can you send in a new prescription and up the dose so it will last for the month.  Please call pt back.  He took last one this am.    [facility]

## 2011-10-21 ENCOUNTER — Other Ambulatory Visit: Payer: Self-pay | Admitting: Internal Medicine

## 2011-10-21 MED ORDER — POTASSIUM CHLORIDE CRYS ER 20 MEQ PO TBCR: 20.0000 meq | EXTENDED_RELEASE_TABLET | Freq: Every day | ORAL | Status: DC

## 2011-10-21 MED ORDER — FUROSEMIDE 40 MG PO TABS: 40.0000 mg | ORAL_TABLET | Freq: Every day | ORAL | Status: DC

## 2011-10-22 ENCOUNTER — Ambulatory Visit (INDEPENDENT_AMBULATORY_CARE_PROVIDER_SITE_OTHER): Payer: Self-pay | Admitting: Internal Medicine

## 2011-10-22 DIAGNOSIS — Z7901 Long term (current) use of anticoagulants: Secondary | ICD-10-CM

## 2011-10-22 DIAGNOSIS — I4891 Unspecified atrial fibrillation: Secondary | ICD-10-CM

## 2011-10-22 DIAGNOSIS — R0989 Other specified symptoms and signs involving the circulatory and respiratory systems: Secondary | ICD-10-CM

## 2011-10-22 LAB — POCT INR: INR: 1.8

## 2011-11-04 ENCOUNTER — Telehealth: Payer: Self-pay

## 2011-11-04 MED ORDER — FUROSEMIDE 40 MG PO TABS: ORAL_TABLET | ORAL | Status: DC

## 2011-11-04 NOTE — Telephone Encounter (Signed)
Pt's spouse called stating that Lasix 40 mg qd is sometime not sufficient and pt has to take an extra pill.  Pt's spouse is requesting new Rx that states 1-2 po qd and an increase quantity. Please advise, okay to increase and refill?

## 2011-11-04 NOTE — Telephone Encounter (Signed)
Yes thanks 

## 2011-11-04 NOTE — Telephone Encounter (Signed)
Rx updated and resent. Pt advised of change.

## 2011-11-05 ENCOUNTER — Ambulatory Visit (INDEPENDENT_AMBULATORY_CARE_PROVIDER_SITE_OTHER): Payer: Self-pay | Admitting: Cardiology

## 2011-11-05 DIAGNOSIS — Z7901 Long term (current) use of anticoagulants: Secondary | ICD-10-CM

## 2011-11-05 DIAGNOSIS — R0989 Other specified symptoms and signs involving the circulatory and respiratory systems: Secondary | ICD-10-CM

## 2011-11-05 DIAGNOSIS — I4891 Unspecified atrial fibrillation: Secondary | ICD-10-CM

## 2011-11-05 LAB — POCT INR: INR: 2.3

## 2011-11-12 ENCOUNTER — Ambulatory Visit (INDEPENDENT_AMBULATORY_CARE_PROVIDER_SITE_OTHER): Payer: Self-pay | Admitting: Cardiology

## 2011-11-12 DIAGNOSIS — R0989 Other specified symptoms and signs involving the circulatory and respiratory systems: Secondary | ICD-10-CM

## 2011-11-12 DIAGNOSIS — Z7901 Long term (current) use of anticoagulants: Secondary | ICD-10-CM

## 2011-11-12 DIAGNOSIS — I4891 Unspecified atrial fibrillation: Secondary | ICD-10-CM

## 2011-11-21 ENCOUNTER — Ambulatory Visit (HOSPITAL_COMMUNITY): Payer: Medicare Other

## 2011-11-22 ENCOUNTER — Other Ambulatory Visit: Payer: Self-pay | Admitting: Internal Medicine

## 2011-11-28 ENCOUNTER — Ambulatory Visit (INDEPENDENT_AMBULATORY_CARE_PROVIDER_SITE_OTHER): Payer: Self-pay | Admitting: Cardiovascular Disease

## 2011-11-28 DIAGNOSIS — Z7901 Long term (current) use of anticoagulants: Secondary | ICD-10-CM

## 2011-11-28 DIAGNOSIS — I4891 Unspecified atrial fibrillation: Secondary | ICD-10-CM

## 2011-11-28 DIAGNOSIS — R0989 Other specified symptoms and signs involving the circulatory and respiratory systems: Secondary | ICD-10-CM

## 2011-12-17 ENCOUNTER — Ambulatory Visit (INDEPENDENT_AMBULATORY_CARE_PROVIDER_SITE_OTHER): Payer: Self-pay | Admitting: Cardiology

## 2011-12-17 DIAGNOSIS — Z7901 Long term (current) use of anticoagulants: Secondary | ICD-10-CM

## 2011-12-17 DIAGNOSIS — I4891 Unspecified atrial fibrillation: Secondary | ICD-10-CM

## 2011-12-17 DIAGNOSIS — R0989 Other specified symptoms and signs involving the circulatory and respiratory systems: Secondary | ICD-10-CM

## 2011-12-17 LAB — POCT INR: INR: 2.1

## 2011-12-23 ENCOUNTER — Encounter: Payer: Self-pay | Admitting: Internal Medicine

## 2011-12-23 ENCOUNTER — Ambulatory Visit (HOSPITAL_COMMUNITY)
Admission: RE | Admit: 2011-12-23 | Discharge: 2011-12-23 | Disposition: A | Discharge status: Home / Self Care | Payer: Medicare Other | Source: Ambulatory Visit | Attending: Internal Medicine | Admitting: Internal Medicine

## 2011-12-23 VITALS — BP 106/64 | HR 75 | Wt 153.5 lb

## 2011-12-23 DIAGNOSIS — I5022 Chronic systolic (congestive) heart failure: Secondary | ICD-10-CM

## 2011-12-23 DIAGNOSIS — I319 Disease of pericardium, unspecified: Secondary | ICD-10-CM

## 2011-12-23 DIAGNOSIS — I4891 Unspecified atrial fibrillation: Secondary | ICD-10-CM

## 2011-12-23 DIAGNOSIS — M7989 Other specified soft tissue disorders: Secondary | ICD-10-CM | POA: Insufficient documentation

## 2011-12-23 DIAGNOSIS — I2510 Atherosclerotic heart disease of native coronary artery without angina pectoris: Secondary | ICD-10-CM | POA: Insufficient documentation

## 2011-12-23 DIAGNOSIS — I509 Heart failure, unspecified: Secondary | ICD-10-CM | POA: Insufficient documentation

## 2011-12-23 DIAGNOSIS — E785 Hyperlipidemia, unspecified: Secondary | ICD-10-CM | POA: Insufficient documentation

## 2011-12-23 DIAGNOSIS — I080 Rheumatic disorders of both mitral and aortic valves: Secondary | ICD-10-CM | POA: Insufficient documentation

## 2011-12-23 DIAGNOSIS — I10 Essential (primary) hypertension: Secondary | ICD-10-CM | POA: Insufficient documentation

## 2011-12-23 LAB — BASIC METABOLIC PANEL
Calcium: 10.2 mg/dL (ref 8.4–10.5)
GFR calc Af Amer: 87 mL/min — ABNORMAL LOW (ref >90)
GFR calc non Af Amer: 75 mL/min — ABNORMAL LOW (ref >90)
Glucose, Bld: 104 mg/dL — ABNORMAL HIGH (ref 70–99)
Potassium: 5.3 mEq/L — ABNORMAL HIGH (ref 3.5–5.1)
Sodium: 140 mEq/L (ref 135–145)

## 2011-12-23 NOTE — Assessment & Plan Note (Signed)
Chronic. Rate controlled. Continue coumadin. 

## 2011-12-23 NOTE — Progress Notes (Signed)
History of Present Illness: Primary Cardiologist:  Dr. Arvilla Meres  [name] is a delightful 78 year old male with history congestive heart failure secondary to ischemic cardiomyopathy.  He has a totally occluded LAD on cardiac catheterization with nonobstructive disease elsewhere.  His EF previously in the 25-30% range. However echo 04/2010 showed EF 45% with mod-sev MR. He is status post BiV ICD.  He has also had problems with atrial fibrillation and undergone several cardioversions (and several anti-arrhhtymics) but now with chronic atrial fib.  He has class 2 symptoms.  In May 2012 admitted with failure to thrive symptoms. PICC line placed and co-ox was 56%. We discussed possible trial of milrinone but decided to take a watch and wait approach. Returns today for routine f/u  Continues to do fairly well. Good days and bad days. Walking with walker. Stays active. Rides exercise bike 15 mins/day.  Weight down 2-3 pounds. Very compliant with meds. Not orthopnea, PND or CP. Mild exertional dyspnea. Occasional ankle edema.  Optivol reviewed today and volume status looks great. Chronic AF mean rate 75. Patient activity 4 hours/day.   All other systems normal except as listed in the HPI and Problem List.     Past Medical History  Diagnosis Date  . Systolic CHF, chronic   . Ischemic cardiomyopathy     a.  echo 8/08: EF 20-30%;  b. echo 5/11: EF 45%, mod to severe MR  . Permanent atrial fibrillation     Amiodarone stopped due to recurrent AFib  . CAD (coronary artery disease)     a. cath 9/06: occluded LAD with L-L and R-L collaterals; LM 20%, CFX 20%, OM1 30%, RCA 20-30%, EF 45%  . Mitral regurgitation     a. mod to severe by echo 5/11  . Anemia     2/2 GI bleed  . Symptomatic bradycardia     a. s/p BiV-ICD;  b. s/p change to BiV pacer only 12/11  . HTN (hypertension)   . HLD (hyperlipidemia)   . Osteoporosis   . BPH (benign prostatic hypertrophy)   . Glaucoma   . Urinary  retention   . Depression   . Cataracts, bilateral     Retinal bleeding after cataract removal  . Glaucoma   . Hiatal hernia     Current Outpatient Prescriptions  Medication Sig Dispense Refill  . azelastine (ASTELIN) 137 MCG/SPRAY nasal spray 1 SPRAY EACH NOSTRIL EVERY MORNING  30 mL  2  . cetirizine (ZYRTEC) 10 MG tablet Take 10 mg by mouth daily.        . clonazePAM (KLONOPIN) 0.5 MG tablet Take 0.5 mg by mouth 2 (two) times daily as needed.        . doxazosin (CARDURA) 4 MG tablet Take 4 mg by mouth at bedtime.        . finasteride (PROSCAR) 5 MG tablet Take 5 mg by mouth daily.        . fluticasone (FLONASE) 50 MCG/ACT nasal spray 1 SPRAY EACH NOSTRIL EVERY MORNING  16 g  2  . furosemide (LASIX) 40 MG tablet Take 40 mg by mouth daily. Can take 1 tab extra if needed      . lisinopril (PRINIVIL,ZESTRIL) 10 MG tablet Take 5 mg by mouth daily.        . Melatonin 3 MG CAPS Take by mouth at bedtime.        . mirtazapine (REMERON) 15 MG tablet Take 15 mg by mouth at bedtime as needed.        Marland Kitchen  Multiple Vitamins-Minerals (OCUVITE PO) Take by mouth daily.        Marland Kitchen omeprazole (PRILOSEC) 20 MG capsule       . Polyethyl Glycol-Propyl Glycol (SYSTANE) 0.4-0.3 % GEL Apply to eye at bedtime as needed.        . potassium chloride SA (KLOR-CON M20) 20 MEQ tablet Take 1 tablet (20 mEq total) by mouth daily.  30 tablet  8  . pravastatin (PRAVACHOL) 40 MG tablet Take 40 mg by mouth daily.        . sertraline (ZOLOFT) 50 MG tablet TAKE 1 TABLET EVERY DAY  30 tablet  6  . Tamsulosin HCl (FLOMAX) 0.4 MG CAPS Take 0.8 mg by mouth daily.       . Vitamin D, Ergocalciferol, (DRISDOL) 50000 UNITS CAPS TAKE 1 CAPSULE EVERY FRIDAY  12 capsule  2  . warfarin (COUMADIN) 5 MG tablet TAKE AS DIRECTED BY ANTICOAGULATION CLINIC  35 tablet  3  . DISCONTD: furosemide (LASIX) 40 MG tablet 1-2 tablets by mouth daily as needed.  45 tablet  6    Allergies  Allergen Reactions  . Penicillins     History  Substance Use  Topics  . Smoking status: Never Smoker   . Smokeless tobacco: Not on file   Comment: Married lives with wife, lives at [facility]. 3 children involved in care-bro in [place], 2 sis in town  . Alcohol Use: No     Vital Signs: BP 106/64  Pulse 75  Wt 153 lb 8 oz (69.627 kg)  SpO2 95%  PHYSICAL EXAM: Elderly kyphotic in no acute distress. Looks good HEENT: normal Neck: JVP 8-9. Carotids 2+ bilaterally Cardiac:  normal S1, S2; RRR; 3/6 holosystolic murmur heard best at the apex; No S3 Lungs:  Course breath sounds at the bases bilaterally, no wheezing, rhonchi or rales Abd: soft, nontender, no hepatomegaly Ext: 2+ bilateral ankle edema. No pretibial edema.  Skin: warm and dry;  chest/upper abdomen, no erythema Neuro:  CNs 2-12 intact, no focal abnormalities noted Psych: Normal affect    ASSESSMENT AND PLAN:

## 2011-12-23 NOTE — Assessment & Plan Note (Signed)
No evidence of ischemia. Continue current regimen.   

## 2011-12-23 NOTE — Progress Notes (Signed)
  Echocardiogram 2D Echocardiogram has been performed.  Cathie Beams Deneen 12/23/2011, 3:01 PM

## 2011-12-23 NOTE — Patient Instructions (Signed)
Labs today  Your physician recommends that you schedule a follow-up appointment in: 2 months    

## 2011-12-23 NOTE — Assessment & Plan Note (Signed)
Doing very well. Volume status looks good. Functional capacity stable/improved. Activity index > 4 hours on ICM analysis. We reviewed echo from today EF stable around 35% Continue current regimen. Reinforced need for daily weights and reviewed use of sliding scale diuretics.

## 2012-01-02 ENCOUNTER — Other Ambulatory Visit: Payer: Self-pay | Admitting: Internal Medicine

## 2012-01-07 ENCOUNTER — Ambulatory Visit (INDEPENDENT_AMBULATORY_CARE_PROVIDER_SITE_OTHER): Payer: Self-pay | Admitting: Cardiology

## 2012-01-07 DIAGNOSIS — R0989 Other specified symptoms and signs involving the circulatory and respiratory systems: Secondary | ICD-10-CM

## 2012-01-07 DIAGNOSIS — I4891 Unspecified atrial fibrillation: Secondary | ICD-10-CM

## 2012-01-07 DIAGNOSIS — Z7901 Long term (current) use of anticoagulants: Secondary | ICD-10-CM

## 2012-01-07 LAB — POCT INR: INR: 2

## 2012-01-13 ENCOUNTER — Other Ambulatory Visit: Payer: Self-pay | Admitting: Internal Medicine

## 2012-01-20 ENCOUNTER — Telehealth (HOSPITAL_COMMUNITY): Payer: Self-pay | Admitting: *Deleted

## 2012-01-20 ENCOUNTER — Ambulatory Visit (INDEPENDENT_AMBULATORY_CARE_PROVIDER_SITE_OTHER): Payer: Medicare Other | Admitting: *Deleted

## 2012-01-20 ENCOUNTER — Encounter: Payer: Self-pay | Admitting: Internal Medicine

## 2012-01-20 DIAGNOSIS — I5022 Chronic systolic (congestive) heart failure: Secondary | ICD-10-CM

## 2012-01-20 DIAGNOSIS — Z95810 Presence of automatic (implantable) cardiac defibrillator: Secondary | ICD-10-CM

## 2012-01-20 DIAGNOSIS — Z950 Presence of cardiac pacemaker: Secondary | ICD-10-CM

## 2012-01-20 NOTE — Telephone Encounter (Signed)
Pt's wife called this AM stating pt's weight has been up, in last 8 days he has taken adn extra 40 mg of lasix 4 days, but not consecutively, had pt send an optivol transmission which showed fluid is up, [name], PA reviewed will have pt take lasix 80 mg along with kcl 20 meq daily for 3 days and call back THur with update, wife verbalizes understanding

## 2012-01-21 ENCOUNTER — Encounter: Payer: Self-pay | Admitting: Internal Medicine

## 2012-01-21 ENCOUNTER — Telehealth (HOSPITAL_COMMUNITY): Payer: Self-pay | Admitting: *Deleted

## 2012-01-21 NOTE — Telephone Encounter (Signed)
Britta Mccreedy called this am in regards to [name].  She is concerned about his increase in weight, 1/4 of a pound, his legs are swollen, and he is not feeling well.  He took extra lasix yesterday, but does not feel any better.  She would like a call back.  Thanks.

## 2012-01-21 NOTE — Telephone Encounter (Signed)
Spoke w/pt's wife she states wt was the same today and he felt bad this AM but is feeling better this afternoon and feel like some swelling is decreasing will touch base w/them tomorrow

## 2012-01-22 ENCOUNTER — Telehealth (HOSPITAL_COMMUNITY): Payer: Self-pay | Admitting: *Deleted

## 2012-01-22 MED ORDER — METOLAZONE 2.5 MG PO TABS: 2.5000 mg | ORAL_TABLET | Freq: Every day | ORAL | Status: DC

## 2012-01-22 NOTE — Telephone Encounter (Addendum)
Spoke w/pt's daughter she is concerned b/c pt wt is not down any after taking all the extra lasix, she noticed he was SOB last night more than usual and his feet are swollen, discussed w/Nicki Elige Radon, [place] will have pt take metolazone 2.5 mg for today and tomorrow, he will bmet Fri at [facility], order faxed to them at [phone], his daughter will call Fri AM with update on how he is doing

## 2012-01-22 NOTE — Telephone Encounter (Signed)
Ms Sundra Aland is Mr Corea's other daughter.  She would like you to return her call today.  Her sister is unavailable and they do not want their mother called.  They are still concerned regarding yesterday's problems.  Thanks.

## 2012-01-22 NOTE — Telephone Encounter (Signed)
Addended by: Noralee Space on: 01/22/2012 03:24 PM   Modules accepted: Orders

## 2012-01-22 NOTE — Telephone Encounter (Signed)
See phone note 2/20

## 2012-01-23 ENCOUNTER — Telehealth (HOSPITAL_COMMUNITY): Payer: Self-pay | Admitting: Vascular Surgery

## 2012-01-23 LAB — REMOTE PACEMAKER DEVICE
AL AMPLITUDE: 0.4 mv
AL IMPEDENCE PM: 380 Ohm
BATTERY VOLTAGE: 2.9737 V
BRDY-0002RV: 75 {beats}/min
VENTRICULAR PACING PM: 95.43

## 2012-01-23 NOTE — Telephone Encounter (Signed)
Pt daughter called Mr. Sandler gained weight after change in Lasix. She wants to talk to the nurse.       Thanks                [employer]

## 2012-01-23 NOTE — Telephone Encounter (Signed)
Daughter states wt is up a quarter of a lb, he did take Metolazone about 5 or 6 pm last night and again this AM advised to give time to work, she states he is urinating a little more than usual, she will in AM with update

## 2012-01-24 ENCOUNTER — Other Ambulatory Visit: Payer: Medicare Other

## 2012-01-24 ENCOUNTER — Telehealth (HOSPITAL_COMMUNITY): Payer: Self-pay | Admitting: *Deleted

## 2012-01-24 ENCOUNTER — Other Ambulatory Visit: Payer: Self-pay | Admitting: Internal Medicine

## 2012-01-24 DIAGNOSIS — I5022 Chronic systolic (congestive) heart failure: Secondary | ICD-10-CM

## 2012-01-24 NOTE — Telephone Encounter (Signed)
Pt daughter called pt weighs 155.5 took reg lasix, call & let her know if Pt needs to do anything different.

## 2012-01-24 NOTE — Telephone Encounter (Signed)
Pt wt back down to 155 today which is pretty much his normal wt he is feeling better, he will have labs today, they will send optivol transmission as well, will resume lasix 40 mg daily

## 2012-01-27 NOTE — Telephone Encounter (Signed)
Spoke to pt's daughter today, his wt has increased over the weekend, he is 157.1 today, labs from [place] are all ok, discussed w/Dr Bensimhon will have pt take Lasix 40 mg daily alternating with 80 mg daily, pt's daughter aware and agreeable she will let me know if wt continues to increase

## 2012-01-27 NOTE — Progress Notes (Signed)
ICD remote with ICM 

## 2012-01-29 ENCOUNTER — Encounter: Payer: Self-pay | Admitting: *Deleted

## 2012-02-03 ENCOUNTER — Other Ambulatory Visit: Payer: Self-pay | Admitting: *Deleted

## 2012-02-03 ENCOUNTER — Other Ambulatory Visit: Payer: Self-pay | Admitting: Internal Medicine

## 2012-02-03 ENCOUNTER — Telehealth: Payer: Self-pay

## 2012-02-03 MED ORDER — FUROSEMIDE 40 MG PO TABS: ORAL_TABLET | ORAL | Status: DC

## 2012-02-03 MED ORDER — WARFARIN SODIUM 5 MG PO TABS: 5.0000 mg | ORAL_TABLET | ORAL | Status: DC

## 2012-02-03 NOTE — Telephone Encounter (Signed)
Pt's spouse advised 

## 2012-02-03 NOTE — Telephone Encounter (Signed)
Yes

## 2012-02-03 NOTE — Telephone Encounter (Signed)
Pt's spouse called stating that occasionally pt has to take more than 1 Lasix daily due to increased fluid retention. Spouse is requesting increased monthly quantity and new directions for 1-2 po qd? Okay to change?

## 2012-02-04 ENCOUNTER — Ambulatory Visit: Payer: Self-pay | Admitting: Cardiology

## 2012-02-04 DIAGNOSIS — Z7901 Long term (current) use of anticoagulants: Secondary | ICD-10-CM

## 2012-02-04 DIAGNOSIS — I4891 Unspecified atrial fibrillation: Secondary | ICD-10-CM

## 2012-02-04 LAB — POCT INR: INR: 2.7

## 2012-02-13 ENCOUNTER — Other Ambulatory Visit: Payer: Self-pay | Admitting: Internal Medicine

## 2012-02-20 ENCOUNTER — Encounter: Payer: Self-pay | Admitting: Internal Medicine

## 2012-02-20 ENCOUNTER — Ambulatory Visit (HOSPITAL_COMMUNITY)
Admission: RE | Admit: 2012-02-20 | Discharge: 2012-02-20 | Disposition: A | Discharge status: Home / Self Care | Payer: Medicare Other | Source: Ambulatory Visit | Attending: Internal Medicine | Admitting: Internal Medicine

## 2012-02-20 VITALS — BP 96/72 | HR 92 | Wt 161.8 lb

## 2012-02-20 DIAGNOSIS — I2510 Atherosclerotic heart disease of native coronary artery without angina pectoris: Secondary | ICD-10-CM | POA: Insufficient documentation

## 2012-02-20 DIAGNOSIS — I5022 Chronic systolic (congestive) heart failure: Secondary | ICD-10-CM | POA: Insufficient documentation

## 2012-02-20 DIAGNOSIS — I059 Rheumatic mitral valve disease, unspecified: Secondary | ICD-10-CM | POA: Insufficient documentation

## 2012-02-20 NOTE — Assessment & Plan Note (Signed)
No ischemic symptoms, continue current medications.   

## 2012-02-20 NOTE — Assessment & Plan Note (Signed)
MR is chronic. Not a surgical candidate. Continue current regimen.  

## 2012-02-20 NOTE — Assessment & Plan Note (Signed)
Volume status elevated today.  NYHA II-III.  Functional capacity stable with activity index > 4 hours on ICM analysis.  Will have him increase lasix 80 mg for 3 days then return to alternating 80/40 mg daily.  Would like his baseline weight around 154 pounds.  Encouraged him to continue daily weights and need for sliding scale lasix.

## 2012-02-20 NOTE — Patient Instructions (Signed)
Increase lasix 80 mg today through Saturday then return to alternating dose on Sunday.  Call the office if weight is not coming down to 154 pounds.  Wear compression hose during the day.    Follow up in 1 month.  Do the following things EVERYDAY: 1) Weigh yourself in the morning before breakfast. Write it down and keep it in a log. 2) Take your medicines as prescribed 3) Eat low salt foods--Limit salt (sodium) to 2000mg  per day.  4) Stay as active as you can everyday

## 2012-02-20 NOTE — Progress Notes (Signed)
History of Present Illness: Primary Cardiologist:  Dr. Arvilla Meres  [name] is a delightful 78 year old male with history congestive heart failure secondary to ischemic cardiomyopathy.  He has a totally occluded LAD on cardiac catheterization with nonobstructive disease elsewhere.  His EF previously in the 25-30% range. However echo 04/2010 showed EF 45% with mod-sev MR. He is status post BiV ICD.  He has also had problems with atrial fibrillation and undergone several cardioversions (and several anti-arrhhtymics) but now with chronic atrial fib.  He has class 2 symptoms.  In May 2012 admitted with failure to thrive symptoms. PICC line placed and co-ox was 56%. We discussed possible trial of milrinone but decided to take a watch and wait approach.   12/2011: ECHO: EF 35-40%, hypokinesis of apical myocardium and septal myocardium. Moderate MR with valve area 1.85 cm^2.  LA severely dilated, RA moderately dilated.  He returns for follow up today.  He is doing pretty good today, yesterday he had increased fatigue.  He has bad days several times a week.  He walks with a walker, can walk down 3 buildings to get to his residential dining hall with minimal dyspnea.  Continues to ride the exercise bike 15 mins/day.  Weight at home fluctuates 154-158 pounds.  Taking lasix 40 alternating with 80.  Compliant with all medications.  No orthopnea/PND or CP.  + lower extremity edema.    Optivol reviewed today and volume status on slight increase.  Chronic AF mean rate 75. Patient activity 4 hours/day.     ROS: All other systems normal except as listed in the HPI and Problem List.   Past Medical History  Diagnosis Date  . Systolic CHF, chronic   . Ischemic cardiomyopathy     a.  echo 8/08: EF 20-30%;  b. echo 5/11: EF 45%, mod to severe MR  . Permanent atrial fibrillation     Amiodarone stopped due to recurrent AFib  . CAD (coronary artery disease)     a. cath 9/06: occluded LAD with L-L and R-L  collaterals; LM 20%, CFX 20%, OM1 30%, RCA 20-30%, EF 45%  . Mitral regurgitation     a. mod to severe by echo 5/11  . Anemia     2/2 GI bleed  . Symptomatic bradycardia     a. s/p BiV-ICD;  b. s/p change to BiV pacer only 12/11  . HTN (hypertension)   . HLD (hyperlipidemia)   . Osteoporosis   . BPH (benign prostatic hypertrophy)   . Glaucoma   . Urinary retention   . Depression   . Cataracts, bilateral     Retinal bleeding after cataract removal  . Glaucoma   . Hiatal hernia     Current Outpatient Prescriptions  Medication Sig Dispense Refill  . azelastine (ASTELIN) 137 MCG/SPRAY nasal spray 1 SPRAY EACH NOSTRIL EVERY MORNING  30 mL  2  . cetirizine (ZYRTEC) 10 MG tablet Take 10 mg by mouth daily.        . clonazePAM (KLONOPIN) 0.5 MG tablet Take 0.5 mg by mouth 2 (two) times daily as needed.        . doxazosin (CARDURA) 4 MG tablet TAKE 1 TABLET AT BEDTIME  30 tablet  5  . finasteride (PROSCAR) 5 MG tablet TAKE 1 TABLET EVERY DAY  30 tablet  5  . fluticasone (FLONASE) 50 MCG/ACT nasal spray 1 SPRAY EACH NOSTRIL EVERY MORNING  16 g  2  . furosemide (LASIX) 40 MG tablet 1-2 tablets by mouth  once daily  60 tablet  3  . lisinopril (PRINIVIL,ZESTRIL) 10 MG tablet TAKE 1/2 TABLET BY MOUTH DAILY  90 tablet  4  . Melatonin 3 MG CAPS Take by mouth at bedtime.        . mirtazapine (REMERON) 15 MG tablet TAKE 1/2 TO 1 TABLET AT BEDTIME AS NEEDED  30 tablet  5  . Multiple Vitamins-Minerals (OCUVITE PO) Take by mouth daily.        Marland Kitchen omeprazole (PRILOSEC) 20 MG capsule TAKE ONE CAPSULE EVERY DAY  30 capsule  5  . Polyethyl Glycol-Propyl Glycol (SYSTANE) 0.4-0.3 % GEL Apply to eye at bedtime as needed.        . potassium chloride SA (KLOR-CON M20) 20 MEQ tablet Take 1 tablet (20 mEq total) by mouth daily.  30 tablet  8  . pravastatin (PRAVACHOL) 40 MG tablet TAKE 1 TABLET AT BEDTIME  30 tablet  5  . sertraline (ZOLOFT) 50 MG tablet TAKE 1 TABLET EVERY DAY  30 tablet  6  . Tamsulosin HCl  (FLOMAX) 0.4 MG CAPS TAKE 2 CAPSULES EVERY DAY  60 capsule  5  . Vitamin D, Ergocalciferol, (DRISDOL) 50000 UNITS CAPS TAKE 1 CAPSULE EVERY FRIDAY  12 capsule  2  . warfarin (COUMADIN) 5 MG tablet Take 1 tablet (5 mg total) by mouth as directed.  35 tablet  3    Allergies  Allergen Reactions  . Penicillins     History  Substance Use Topics  . Smoking status: Never Smoker   . Smokeless tobacco: Not on file   Comment: Married lives with wife, lives at [facility]. 3 children involved in care-bro in [place], 2 sis in town  . Alcohol Use: No     Vital Signs: BP 96/72  Pulse 92  Wt 161 lb 12 oz (73.369 kg)  SpO2 93%  PHYSICAL EXAM: Elderly kyphotic in no acute distress. Looks good HEENT: normal Neck: JVP 9-10. Carotids 2+ bilaterally Cardiac:  normal S1, S2; RRR; 3/6 holosystolic murmur heard best at the apex; No S3 Lungs:  Course breath sounds at the bases bilaterally, no wheezing, rhonchi or rales Abd: soft, nontender, no hepatomegaly Ext: 2+ bilateral ankle edema. 2-3+ edema bilateral lower extremities.    Skin: warm and dry;  chest/upper abdomen, no erythema Neuro:  CNs 2-12 intact, no focal abnormalities noted Psych: Normal affect    ASSESSMENT AND PLAN:

## 2012-02-24 ENCOUNTER — Telehealth (HOSPITAL_COMMUNITY): Payer: Self-pay | Admitting: *Deleted

## 2012-02-24 NOTE — Telephone Encounter (Signed)
Per Dr Gala Romney pt needs CBC, BMET and BNP pt's daughter is aware, she will have pt go for them in the AM and order faxed to [facility] at [phone]

## 2012-02-24 NOTE — Telephone Encounter (Signed)
Spoke w/pt's daughter she states pt's wt is the same as it was on Thur 156 it has not moved down and he has taken Lasix 80 mg since Fri, she states he gets completely exhausted with very little activity, even with just getting dressed or putting on his shoes and she is concerned, will discuss with Dr Gala Romney and call her back

## 2012-02-24 NOTE — Telephone Encounter (Signed)
Ms Loreta Ave called today in regards to [name].  She states that he has not lost any weight since last week.  She feels that he is gradually deteriorating.  She would like a call back.  Thanks.

## 2012-02-26 ENCOUNTER — Telehealth (HOSPITAL_COMMUNITY): Payer: Self-pay | Admitting: *Deleted

## 2012-02-26 ENCOUNTER — Other Ambulatory Visit (HOSPITAL_COMMUNITY): Payer: Self-pay | Admitting: *Deleted

## 2012-02-26 MED ORDER — METOLAZONE 2.5 MG PO TABS: 2.5000 mg | ORAL_TABLET | Freq: Every day | ORAL | Status: DC

## 2012-02-26 NOTE — Telephone Encounter (Signed)
Pt's labs reviewed by Dr Gala Romney, he recommends pt take a dose of metolazone and see how he does, Left message for Britta Mccreedy to call back

## 2012-02-26 NOTE — Telephone Encounter (Signed)
Ms [name] called again today in regards to her dad.   He is not getting any better. His weight is up to 156.4.  She would like a call back, asap

## 2012-02-26 NOTE — Telephone Encounter (Signed)
Spoke w/Barbara she is aware of lab results and will give pt a dose of metolazone tomorrow

## 2012-02-29 ENCOUNTER — Encounter: Payer: Self-pay | Admitting: Internal Medicine

## 2012-03-03 ENCOUNTER — Ambulatory Visit (INDEPENDENT_AMBULATORY_CARE_PROVIDER_SITE_OTHER): Payer: Medicare Other | Admitting: Cardiovascular Disease

## 2012-03-03 ENCOUNTER — Telehealth (HOSPITAL_COMMUNITY): Payer: Self-pay | Admitting: *Deleted

## 2012-03-03 DIAGNOSIS — Z7901 Long term (current) use of anticoagulants: Secondary | ICD-10-CM

## 2012-03-03 DIAGNOSIS — I4891 Unspecified atrial fibrillation: Secondary | ICD-10-CM

## 2012-03-03 NOTE — Telephone Encounter (Signed)
Left message to call back  

## 2012-03-03 NOTE — Telephone Encounter (Signed)
Ms Loreta Ave called regarding her dad.  His weight is down to 152.6 and they want to know what they need to do to adjust his lasix and potassium.  Please call her back. Thanks.

## 2012-03-03 NOTE — Telephone Encounter (Signed)
Spoke w/Barbara will decrease lasix back to 80 mg alternating with 40 mg the next day and will take kcl only on days when takes lasix 80 mg

## 2012-03-21 ENCOUNTER — Other Ambulatory Visit: Payer: Self-pay | Admitting: Internal Medicine

## 2012-03-23 ENCOUNTER — Ambulatory Visit (HOSPITAL_COMMUNITY)
Admission: RE | Admit: 2012-03-23 | Discharge: 2012-03-23 | Disposition: A | Discharge status: Home / Self Care | Payer: Medicare Other | Source: Ambulatory Visit | Attending: Internal Medicine | Admitting: Internal Medicine

## 2012-03-23 ENCOUNTER — Encounter: Payer: Self-pay | Admitting: Internal Medicine

## 2012-03-23 ENCOUNTER — Ambulatory Visit (INDEPENDENT_AMBULATORY_CARE_PROVIDER_SITE_OTHER): Payer: Medicare Other | Admitting: *Deleted

## 2012-03-23 ENCOUNTER — Encounter (HOSPITAL_COMMUNITY): Payer: Self-pay

## 2012-03-23 VITALS — BP 102/60 | HR 91 | Wt 161.8 lb

## 2012-03-23 DIAGNOSIS — Z95810 Presence of automatic (implantable) cardiac defibrillator: Secondary | ICD-10-CM

## 2012-03-23 DIAGNOSIS — I059 Rheumatic mitral valve disease, unspecified: Secondary | ICD-10-CM

## 2012-03-23 DIAGNOSIS — I4891 Unspecified atrial fibrillation: Secondary | ICD-10-CM

## 2012-03-23 DIAGNOSIS — I509 Heart failure, unspecified: Secondary | ICD-10-CM | POA: Insufficient documentation

## 2012-03-23 DIAGNOSIS — I2510 Atherosclerotic heart disease of native coronary artery without angina pectoris: Secondary | ICD-10-CM

## 2012-03-23 DIAGNOSIS — I5022 Chronic systolic (congestive) heart failure: Secondary | ICD-10-CM

## 2012-03-23 DIAGNOSIS — Z950 Presence of cardiac pacemaker: Secondary | ICD-10-CM

## 2012-03-23 NOTE — Patient Instructions (Signed)
Take an extra 40 mg Lasix if your weight is greater than 154 pounds.  Follow up in 2 months  Do the following things EVERYDAY: 1) Weigh yourself in the morning before breakfast. Write it down and keep it in a log. 2) Take your medicines as prescribed 3) Eat low salt foods--Limit salt (sodium) to 2000mg  per day.  4) Stay as active as you can everyday

## 2012-03-23 NOTE — Assessment & Plan Note (Signed)
Chronic MR. Not a surgical candidate. Continue current regimen.

## 2012-03-23 NOTE — Assessment & Plan Note (Signed)
No ischemic symptoms, continue current medications.   

## 2012-03-23 NOTE — Progress Notes (Signed)
Patient ID: [name], male   DOB: 1924-08-26, 78 y.o.   MRN: [id number] History of Present Illness: Primary Cardiologist:  Dr. Arvilla Meres  [name] is a delightful 78 year old male with history congestive heart failure secondary to ischemic cardiomyopathy.  He has a totally occluded LAD on cardiac catheterization with nonobstructive disease elsewhere.  His EF previously in the 25-30% range. However echo 04/2010 showed EF 45% with mod-sev MR. He is status post BiV ICD.  He has also had problems with atrial fibrillation and undergone several cardioversions (and several anti-arrhhtymics) but now with chronic atrial fib.  He has class 2 symptoms.  In May 2012 admitted with failure to thrive symptoms. PICC line placed and co-ox was 56%. We discussed possible trial of milrinone but decided to take a watch and wait approach.   12/2011: ECHO: EF 35-40%, hypokinesis of apical myocardium and septal myocardium. Moderate MR with valve area 1.85 cm^2.  LA severely dilated, RA moderately dilated.  He returns for follow up today. Denies SOB/PND/Orhtopnea. He continues on 152-155 pounds. Complaint with medications. Ambulates with a walker. Continues to live at [facility].   ROS: All other systems normal except as listed in the HPI and Problem List.   Past Medical History  Diagnosis Date  . Systolic CHF, chronic   . Ischemic cardiomyopathy     a.  echo 8/08: EF 20-30%;  b. echo 5/11: EF 45%, mod to severe MR  . Permanent atrial fibrillation     Amiodarone stopped due to recurrent AFib  . CAD (coronary artery disease)     a. cath 9/06: occluded LAD with L-L and R-L collaterals; LM 20%, CFX 20%, OM1 30%, RCA 20-30%, EF 45%  . Mitral regurgitation     a. mod to severe by echo 5/11  . Anemia     2/2 GI bleed  . Symptomatic bradycardia     a. s/p BiV-ICD;  b. s/p change to BiV pacer only 12/11  . HTN (hypertension)   . HLD (hyperlipidemia)   . Osteoporosis   . BPH (benign prostatic  hypertrophy)   . Glaucoma   . Urinary retention   . Depression   . Cataracts, bilateral     Retinal bleeding after cataract removal  . Glaucoma   . Hiatal hernia     Current Outpatient Prescriptions  Medication Sig Dispense Refill  . azelastine (ASTELIN) 137 MCG/SPRAY nasal spray 1 SPRAY EACH NOSTRIL EVERY MORNING  30 mL  2  . cetirizine (ZYRTEC) 10 MG tablet Take 10 mg by mouth daily.        . clonazePAM (KLONOPIN) 0.5 MG tablet Take 0.5 mg by mouth 2 (two) times daily as needed.        . doxazosin (CARDURA) 4 MG tablet TAKE 1 TABLET AT BEDTIME  30 tablet  5  . finasteride (PROSCAR) 5 MG tablet TAKE 1 TABLET EVERY DAY  30 tablet  5  . fluticasone (FLONASE) 50 MCG/ACT nasal spray 1 SPRAY EACH NOSTRIL EVERY MORNING  16 g  2  . furosemide (LASIX) 40 MG tablet 1 tab daily alternating with 2 tabs the next day      . lisinopril (PRINIVIL,ZESTRIL) 10 MG tablet TAKE 1/2 TABLET BY MOUTH DAILY  90 tablet  4  . Melatonin 3 MG CAPS Take by mouth at bedtime.        . mirtazapine (REMERON) 15 MG tablet TAKE 1/2 TO 1 TABLET AT BEDTIME AS NEEDED  30 tablet  5  .  Multiple Vitamins-Minerals (OCUVITE PO) Take by mouth daily.        Marland Kitchen omeprazole (PRILOSEC) 20 MG capsule TAKE ONE CAPSULE EVERY DAY  30 capsule  5  . potassium chloride SA (K-DUR,KLOR-CON) 20 MEQ tablet Take 20 mEq by mouth every other day.      . pravastatin (PRAVACHOL) 40 MG tablet TAKE 1 TABLET AT BEDTIME  30 tablet  5  . sertraline (ZOLOFT) 50 MG tablet TAKE 1 TABLET EVERY DAY  30 tablet  6  . Tamsulosin HCl (FLOMAX) 0.4 MG CAPS TAKE 2 CAPSULES EVERY DAY  60 capsule  5  . Vitamin D, Ergocalciferol, (DRISDOL) 50000 UNITS CAPS TAKE 1 CAPSULE EVERY FRIDAY  12 capsule  2  . warfarin (COUMADIN) 5 MG tablet Take 1 tablet (5 mg total) by mouth as directed.  35 tablet  3  . metolazone (ZAROXOLYN) 2.5 MG tablet Take 1 tablet (2.5 mg total) by mouth daily.  5 tablet  2    Allergies  Allergen Reactions  . Penicillins     History  Substance  Use Topics  . Smoking status: Never Smoker   . Smokeless tobacco: Not on file   Comment: Married lives with wife, lives at [facility]. 3 children involved in care-bro in [place], 2 sis in town  . Alcohol Use: No     Vital Signs: BP 102/60  Pulse 91  Wt 161 lb 12.8 oz (73.392 kg)  SpO2 97% Weight 161 (161) PHYSICAL EXAM: Elderly kyphotic in no acute distress. Looks good HEENT: normal Neck: JVP 8-9 Carotids 2+ bilaterally Cardiac:  normal S1, S2; RRR; 3/6 MR; No S3 Lungs:  Course breath sounds at the bases bilaterally, no wheezing, rhonchi or rales Abd: soft, nontender, no hepatomegaly Ext: 1+ bilateral ankle edema. 1+ edema bilateral lower extremities.   Bilateral ted hose in place Skin: warm and dry;  chest/upper abdomen, no erythema Neuro:  CNs 2-12 intact, no focal abnormalities noted Psych: Normal affect    ASSESSMENT AND PLAN:

## 2012-03-23 NOTE — Assessment & Plan Note (Addendum)
NYHA III. Volume status mildly elevated. He is instructed to take an extra Lasix if his weight is greater than 155 pounds. Follow up in 2 month  Patient seen and examined with Tonye Becket, NP. We discussed all aspects of the encounter. I agree with the assessment and plan as stated above.  Doing well. Volume status well controlled. Exam stable. Reinforced need for daily weights and reviewed use of sliding scale diuretics.

## 2012-03-24 ENCOUNTER — Telehealth (HOSPITAL_COMMUNITY): Payer: Self-pay | Admitting: *Deleted

## 2012-03-24 NOTE — Telephone Encounter (Signed)
Cindy from [facility] called regarding [name]'s BMET.  She is unable to obtain it today, but will get it tomorrow and fax [place] the results. She wants to be srue this is ok.  Thanks.

## 2012-03-24 NOTE — Telephone Encounter (Signed)
Ok

## 2012-03-27 LAB — REMOTE PACEMAKER DEVICE
BATTERY VOLTAGE: 2.9732 V
BRDY-0002RV: 75 {beats}/min
BRDY-0004RV: 120 {beats}/min
VENTRICULAR PACING PM: 92.99

## 2012-03-30 ENCOUNTER — Encounter (HOSPITAL_COMMUNITY): Payer: Self-pay

## 2012-03-30 NOTE — Assessment & Plan Note (Signed)
Attending: This is chronic. Failed extensive rhythm control attempts in past. Continue coumadin.

## 2012-03-31 NOTE — Progress Notes (Signed)
Remote pacer check w/icm

## 2012-04-06 ENCOUNTER — Telehealth (HOSPITAL_COMMUNITY): Payer: Self-pay | Admitting: *Deleted

## 2012-04-06 NOTE — Telephone Encounter (Signed)
Pt's daughter called this AM and stated his wt has been staying elevated, he has been taking Lasix 80 mg daily and he sent an Optivol transmission.  Reviewed Optivol which is elevated, per Dr Gala Romney have pt take metolazone for 2 days, pt's daughter is aware and will let me know if wt not down

## 2012-04-08 ENCOUNTER — Encounter (HOSPITAL_COMMUNITY): Payer: Self-pay | Admitting: *Deleted

## 2012-04-08 ENCOUNTER — Telehealth (HOSPITAL_COMMUNITY): Payer: Self-pay | Admitting: *Deleted

## 2012-04-08 ENCOUNTER — Ambulatory Visit (HOSPITAL_COMMUNITY)
Admission: RE | Admit: 2012-04-08 | Discharge: 2012-04-08 | Disposition: A | Discharge status: Home / Self Care | Payer: Medicare Other | Source: Ambulatory Visit | Attending: Internal Medicine | Admitting: Internal Medicine

## 2012-04-08 VITALS — BP 112/75 | HR 80 | Wt 161.0 lb

## 2012-04-08 DIAGNOSIS — N400 Enlarged prostate without lower urinary tract symptoms: Secondary | ICD-10-CM | POA: Insufficient documentation

## 2012-04-08 DIAGNOSIS — F329 Major depressive disorder, single episode, unspecified: Secondary | ICD-10-CM | POA: Insufficient documentation

## 2012-04-08 DIAGNOSIS — R0609 Other forms of dyspnea: Secondary | ICD-10-CM | POA: Insufficient documentation

## 2012-04-08 DIAGNOSIS — F3289 Other specified depressive episodes: Secondary | ICD-10-CM | POA: Insufficient documentation

## 2012-04-08 DIAGNOSIS — I498 Other specified cardiac arrhythmias: Secondary | ICD-10-CM | POA: Insufficient documentation

## 2012-04-08 DIAGNOSIS — Z79899 Other long term (current) drug therapy: Secondary | ICD-10-CM | POA: Insufficient documentation

## 2012-04-08 DIAGNOSIS — I4891 Unspecified atrial fibrillation: Secondary | ICD-10-CM | POA: Insufficient documentation

## 2012-04-08 DIAGNOSIS — I509 Heart failure, unspecified: Secondary | ICD-10-CM | POA: Insufficient documentation

## 2012-04-08 DIAGNOSIS — I5022 Chronic systolic (congestive) heart failure: Secondary | ICD-10-CM

## 2012-04-08 DIAGNOSIS — R05 Cough: Secondary | ICD-10-CM | POA: Insufficient documentation

## 2012-04-08 DIAGNOSIS — K449 Diaphragmatic hernia without obstruction or gangrene: Secondary | ICD-10-CM | POA: Insufficient documentation

## 2012-04-08 DIAGNOSIS — Z7901 Long term (current) use of anticoagulants: Secondary | ICD-10-CM | POA: Insufficient documentation

## 2012-04-08 DIAGNOSIS — I2589 Other forms of chronic ischemic heart disease: Secondary | ICD-10-CM | POA: Insufficient documentation

## 2012-04-08 DIAGNOSIS — R5381 Other malaise: Secondary | ICD-10-CM | POA: Insufficient documentation

## 2012-04-08 DIAGNOSIS — R059 Cough, unspecified: Secondary | ICD-10-CM | POA: Insufficient documentation

## 2012-04-08 DIAGNOSIS — I059 Rheumatic mitral valve disease, unspecified: Secondary | ICD-10-CM | POA: Insufficient documentation

## 2012-04-08 DIAGNOSIS — R0989 Other specified symptoms and signs involving the circulatory and respiratory systems: Secondary | ICD-10-CM | POA: Insufficient documentation

## 2012-04-08 DIAGNOSIS — H409 Unspecified glaucoma: Secondary | ICD-10-CM | POA: Insufficient documentation

## 2012-04-08 DIAGNOSIS — R5383 Other fatigue: Secondary | ICD-10-CM | POA: Insufficient documentation

## 2012-04-08 DIAGNOSIS — E785 Hyperlipidemia, unspecified: Secondary | ICD-10-CM | POA: Insufficient documentation

## 2012-04-08 DIAGNOSIS — Z880 Allergy status to penicillin: Secondary | ICD-10-CM | POA: Insufficient documentation

## 2012-04-08 DIAGNOSIS — M810 Age-related osteoporosis without current pathological fracture: Secondary | ICD-10-CM | POA: Insufficient documentation

## 2012-04-08 LAB — COMPREHENSIVE METABOLIC PANEL
ALT: 20 U/L (ref 0–53)
AST: 26 U/L (ref 0–37)
Albumin: 3.9 g/dL (ref 3.5–5.2)
Alkaline Phosphatase: 53 U/L (ref 39–117)
Potassium: 3.5 mEq/L (ref 3.5–5.1)
Sodium: 138 mEq/L (ref 135–145)
Total Protein: 6.6 g/dL (ref 6.0–8.3)

## 2012-04-08 LAB — CBC
MCHC: 34.2 g/dL (ref 30.0–36.0)
Platelets: 169 10*3/uL (ref 150–400)
RDW: 14 % (ref 11.5–15.5)

## 2012-04-08 MED ORDER — FUROSEMIDE 8 MG/ML PO SOLN
80.0000 mg | Freq: Once | ORAL | Status: DC
  Administered 2012-04-08: 80 mg via ORAL

## 2012-04-08 MED ORDER — FUROSEMIDE 10 MG/ML IJ SOLN: 80.0000 mg | Freq: Once | INTRAMUSCULAR | Status: DC

## 2012-04-08 NOTE — Telephone Encounter (Signed)
Truddie Hidden called today in regards to her dad, he is retaining fluid.   His weight is down to 156, down 1.5 lbs since Monday, his B/P is low 93/56, 105/60, P 70.  He feels horrible. They would like a call back. Thanks.

## 2012-04-08 NOTE — Progress Notes (Signed)
Pt had 225 cc of urine output

## 2012-04-08 NOTE — Progress Notes (Signed)
Primary Cardiologist:  Dr. Daniel Bensimhon  HPI:   [name] is a delightful 78-year-old male with history congestive heart failure secondary to ischemic cardiomyopathy.  He has a totally occluded LAD on cardiac catheterization with nonobstructive disease elsewhere.  His EF previously in the 25-30% range. However echo 04/2010 showed EF 45% with mod-sev MR. He is status post BiV ICD.  He has also had problems with atrial fibrillation and undergone several cardioversions (and several anti-arrhhtymics) but now with chronic atrial fib.  He has class 2 symptoms.  In May 2012 admitted with failure to thrive symptoms. PICC line placed and co-ox was 56%. We discussed possible trial of milrinone but decided to take a watch and wait approach.   12/2011: ECHO: EF 35-40%, hypokinesis of apical myocardium and septal myocardium. Moderate MR with valve area 1.85 cm^2.  LA severely dilated, RA moderately dilated.  Here for a work in visit today.  He has felt terrible the last 2 days.  Says he has had no energy and wanted to sleep more.  Per nurse at [facility] this morning the pt was dyspneic and flushed after walking to the bathroom, SBP low 80s so they were going to bring him to the ER but came to clinic instead.  He has been taking lasix 80 mg in the morning for last 3 weeks (usually takes 80 mg alternating with 40 mg) besides 3-4 days taking his usual 40 mg daily.  2 days ago he called and weight was up to 157.8 pounds and he was instructed to take metolazone 2.5 mg for 2 days. He is only down 1.5 pounds and feeling bad.  He denies dizziness/syncope.  +cough with some productive sputum.  No fever/chills.  No bleeding.  Optival today: Fluid index has been on the rise since mid April.  Pts activity has been slightly down since the beginning of May.  V rate has been elevated >100 bpm 6 days since the end of April.     ROS: All other systems normal except as listed in the HPI and Problem List.   Past Medical  History  Diagnosis Date  . Systolic CHF, chronic   . Ischemic cardiomyopathy     a.  echo 8/08: EF 20-30%;  b. echo 5/11: EF 45%, mod to severe MR  . Permanent atrial fibrillation     Amiodarone stopped due to recurrent AFib  . CAD (coronary artery disease)     a. cath 9/06: occluded LAD with L-L and R-L collaterals; LM 20%, CFX 20%, OM1 30%, RCA 20-30%, EF 45%  . Mitral regurgitation     a. mod to severe by echo 5/11  . Anemia     2/2 GI bleed  . Symptomatic bradycardia     a. s/p BiV-ICD;  b. s/p change to BiV pacer only 12/11  . HTN (hypertension)   . HLD (hyperlipidemia)   . Osteoporosis   . BPH (benign prostatic hypertrophy)   . Glaucoma   . Urinary retention   . Depression   . Cataracts, bilateral     Retinal bleeding after cataract removal  . Glaucoma   . Hiatal hernia     Current Outpatient Prescriptions  Medication Sig Dispense Refill  . azelastine (ASTELIN) 137 MCG/SPRAY nasal spray 1 SPRAY EACH NOSTRIL EVERY MORNING  30 mL  2  . cetirizine (ZYRTEC) 10 MG tablet Take 10 mg by mouth daily.        . clonazePAM (KLONOPIN) 0.5 MG tablet Take 0.5 mg   by mouth 2 (two) times daily as needed.        . doxazosin (CARDURA) 4 MG tablet TAKE 1 TABLET AT BEDTIME  30 tablet  5  . finasteride (PROSCAR) 5 MG tablet TAKE 1 TABLET EVERY DAY  30 tablet  5  . fluticasone (FLONASE) 50 MCG/ACT nasal spray 1 SPRAY EACH NOSTRIL EVERY MORNING  16 g  2  . furosemide (LASIX) 40 MG tablet Take 80 mg by mouth daily.       . lisinopril (PRINIVIL,ZESTRIL) 10 MG tablet TAKE 1/2 TABLET BY MOUTH DAILY  90 tablet  4  . Melatonin 3 MG CAPS Take by mouth at bedtime.        . metolazone (ZAROXOLYN) 2.5 MG tablet Take 2.5 mg by mouth daily as needed.      . mirtazapine (REMERON) 15 MG tablet TAKE 1/2 TO 1 TABLET AT BEDTIME AS NEEDED  30 tablet  5  . Multiple Vitamins-Minerals (OCUVITE PO) Take by mouth daily.        . omeprazole (PRILOSEC) 20 MG capsule TAKE ONE CAPSULE EVERY DAY  30 capsule  5  .  potassium chloride SA (K-DUR,KLOR-CON) 20 MEQ tablet Take 20 mEq by mouth every other day.      . pravastatin (PRAVACHOL) 40 MG tablet TAKE 1 TABLET AT BEDTIME  30 tablet  5  . sertraline (ZOLOFT) 50 MG tablet TAKE 1 TABLET EVERY DAY  30 tablet  6  . Tamsulosin HCl (FLOMAX) 0.4 MG CAPS TAKE 2 CAPSULES EVERY DAY  60 capsule  5  . Vitamin D, Ergocalciferol, (DRISDOL) 50000 UNITS CAPS TAKE 1 CAPSULE EVERY FRIDAY  12 capsule  2  . warfarin (COUMADIN) 5 MG tablet Take 1 tablet (5 mg total) by mouth as directed.  35 tablet  3  . metolazone (ZAROXOLYN) 2.5 MG tablet Take 1 tablet (2.5 mg total) by mouth daily.  2 tablet  0    Allergies  Allergen Reactions  . Penicillins      Vital Signs: BP 82/58  Pulse 80  Wt 161 lb (73.029 kg)  SpO2 96%  PHYSICAL EXAM: Elderly kyphotic in no acute distress. Looks good HEENT: normal Neck: JVP 10-11, +v waves.  Carotids 2+ bilaterally Cardiac:  normal S1, S2; RRR; 3/6 MR; No S3 Lungs:  Course breath sounds at the bases bilaterally, no wheezing, rhonchi or rales Abd: soft, nontender, no hepatomegaly Ext: 1+ bilateral ankle edema. 1+ Rt > lt edema bilateral lower extremities.   Bilateral ted hose in place Skin: warm and dry;  chest/upper abdomen, no erythema Neuro:  CNs 2-12 intact, no focal abnormalities noted Psych: Normal affect    ASSESSMENT AND PLAN:   

## 2012-04-08 NOTE — Telephone Encounter (Signed)
Spoke w/[name], scheduled appt for this afternoon at 2:30 today

## 2012-04-08 NOTE — Progress Notes (Signed)
22 gauge IV started in left forearm and 80 mg IV Lasix given.  Patient tolerated well

## 2012-04-08 NOTE — Progress Notes (Signed)
Pt had 250 cc of urine output 

## 2012-04-08 NOTE — Patient Instructions (Addendum)
Stop lisinopril  You are scheduled for a heart cath on Friday 5/10, see instruction sheet

## 2012-04-08 NOTE — Assessment & Plan Note (Addendum)
NYHA IIIb.  Volume status elevated today with increased fatigue and hypotension.   The current regimen of metolazone and lasix has not been very effective over the last two days.  Discussed possible options for treatment which include hospitalization vs IV lasix with close follow up.  Will start with IV lasix 80 mg x1 in the clinic today with low threshold to push forward with admission if blood pressure drops or symptoms do not improve.  If hospitalization is required will need to proceed with RHC for hemodynamics and possible inotrope therapy.  The patient, his wife and daughters are agreeable to this plan.  Will check STAT CMET and CBC.  Stop lisinopril.    Patient seen and examined with [name], PA-C. We discussed all aspects of the encounter. I agree with the assessment and plan as stated above. Patient with progressive symptoms now NHYA IIIb. We gave him IV lasix in clinic with 1L out and mild improvement but still very fatigued. Labs ok. Will plan RHC on Friday to determine need for home inotropes.

## 2012-04-08 NOTE — Progress Notes (Signed)
Pt had 300 cc of urine output

## 2012-04-09 ENCOUNTER — Other Ambulatory Visit (HOSPITAL_COMMUNITY): Payer: Self-pay | Admitting: Physician Assistant

## 2012-04-09 DIAGNOSIS — R0600 Dyspnea, unspecified: Secondary | ICD-10-CM

## 2012-04-10 ENCOUNTER — Encounter (HOSPITAL_COMMUNITY)
Admission: RE | Disposition: A | Discharge status: Nursing Home (Includes ICF) | Payer: Self-pay | Source: Ambulatory Visit | Attending: Internal Medicine

## 2012-04-10 ENCOUNTER — Encounter (HOSPITAL_COMMUNITY): Payer: Self-pay | Admitting: Internal Medicine

## 2012-04-10 ENCOUNTER — Ambulatory Visit (HOSPITAL_COMMUNITY)
Admission: RE | Admit: 2012-04-10 | Discharge: 2012-04-10 | Disposition: A | Discharge status: Nursing Home (Includes ICF) | Payer: Medicare Other | Source: Ambulatory Visit | Attending: Internal Medicine | Admitting: Internal Medicine

## 2012-04-10 DIAGNOSIS — I2589 Other forms of chronic ischemic heart disease: Secondary | ICD-10-CM | POA: Insufficient documentation

## 2012-04-10 DIAGNOSIS — I4891 Unspecified atrial fibrillation: Secondary | ICD-10-CM | POA: Insufficient documentation

## 2012-04-10 DIAGNOSIS — I509 Heart failure, unspecified: Secondary | ICD-10-CM

## 2012-04-10 DIAGNOSIS — I059 Rheumatic mitral valve disease, unspecified: Secondary | ICD-10-CM | POA: Insufficient documentation

## 2012-04-10 DIAGNOSIS — I2510 Atherosclerotic heart disease of native coronary artery without angina pectoris: Secondary | ICD-10-CM | POA: Insufficient documentation

## 2012-04-10 DIAGNOSIS — R0989 Other specified symptoms and signs involving the circulatory and respiratory systems: Secondary | ICD-10-CM | POA: Insufficient documentation

## 2012-04-10 DIAGNOSIS — I5022 Chronic systolic (congestive) heart failure: Secondary | ICD-10-CM | POA: Insufficient documentation

## 2012-04-10 DIAGNOSIS — R0600 Dyspnea, unspecified: Secondary | ICD-10-CM

## 2012-04-10 DIAGNOSIS — R0609 Other forms of dyspnea: Secondary | ICD-10-CM | POA: Insufficient documentation

## 2012-04-10 DIAGNOSIS — I10 Essential (primary) hypertension: Secondary | ICD-10-CM | POA: Insufficient documentation

## 2012-04-10 HISTORY — PX: RIGHT HEART CATHETERIZATION: SHX5447

## 2012-04-10 LAB — POCT I-STAT 3, VENOUS BLOOD GAS (G3P V)
Acid-Base Excess: 3 mmol/L — ABNORMAL HIGH (ref 0.0–2.0)
Acid-Base Excess: 2 mmol/L (ref 0.0–2.0)
Bicarbonate: 27.4 mEq/L — ABNORMAL HIGH (ref 20.0–24.0)
Bicarbonate: 26.9 meq/L — ABNORMAL HIGH (ref 20.0–24.0)
O2 Saturation: 62 %
O2 Saturation: 59 %
TCO2: 29 mmol/L (ref 0–100)
TCO2: 28 mmol/L (ref 0–100)
pCO2, Ven: 42.5 mmHg — ABNORMAL LOW (ref 45.0–50.0)
pCO2, Ven: 42.5 mmHg — ABNORMAL LOW (ref 45.0–50.0)
pH, Ven: 7.417 — ABNORMAL HIGH (ref 7.250–7.300)
pH, Ven: 7.41 — ABNORMAL HIGH (ref 7.250–7.300)
pO2, Ven: 32 mmHg (ref 30.0–45.0)
pO2, Ven: 31 mmHg (ref 30.0–45.0)

## 2012-04-10 LAB — CBC
HCT: 35.2 % — ABNORMAL LOW (ref 39.0–52.0)
MCHC: 33.8 g/dL (ref 30.0–36.0)
Platelets: 163 10*3/uL (ref 150–400)
RDW: 13.9 % (ref 11.5–15.5)

## 2012-04-10 LAB — POCT I-STAT 3, ART BLOOD GAS (G3+)
Acid-Base Excess: 3 mmol/L — ABNORMAL HIGH (ref 0.0–2.0)
Bicarbonate: 26 meq/L — ABNORMAL HIGH (ref 20.0–24.0)
O2 Saturation: 96 %
TCO2: 27 mmol/L (ref 0–100)
pCO2 arterial: 33.4 mmHg — ABNORMAL LOW (ref 35.0–45.0)
pH, Arterial: 7.498 — ABNORMAL HIGH (ref 7.350–7.450)
pO2, Arterial: 77 mmHg — ABNORMAL LOW (ref 80.0–100.0)

## 2012-04-10 LAB — PROTIME-INR: INR: 1.95 — ABNORMAL HIGH (ref 0.00–1.49)

## 2012-04-10 LAB — BASIC METABOLIC PANEL
BUN: 40 mg/dL — ABNORMAL HIGH (ref 6–23)
Calcium: 9.7 mg/dL (ref 8.4–10.5)
Chloride: 101 mEq/L (ref 96–112)
Creatinine, Ser: 1.27 mg/dL (ref 0.50–1.35)
GFR calc Af Amer: 58 mL/min — ABNORMAL LOW (ref >90)
GFR calc non Af Amer: 50 mL/min — ABNORMAL LOW (ref >90)

## 2012-04-10 SURGERY — RIGHT HEART CATH
Anesthesia: LOCAL

## 2012-04-10 MED ORDER — POTASSIUM CHLORIDE CRYS ER 20 MEQ PO TBCR
EXTENDED_RELEASE_TABLET | ORAL | Status: AC
  Filled 2012-04-10: qty 2

## 2012-04-10 MED ORDER — SODIUM CHLORIDE 0.9 % IV SOLN: 250.0000 mL | INTRAVENOUS | Status: DC | PRN

## 2012-04-10 MED ORDER — SODIUM CHLORIDE 0.9 % IJ SOLN: 3.0000 mL | INTRAMUSCULAR | Status: DC | PRN

## 2012-04-10 MED ORDER — HEPARIN (PORCINE) IN NACL 2-0.9 UNIT/ML-% IJ SOLN
INTRAMUSCULAR | Status: AC
  Filled 2012-04-10: qty 1000

## 2012-04-10 MED ORDER — POTASSIUM CHLORIDE CRYS ER 20 MEQ PO TBCR
40.0000 meq | EXTENDED_RELEASE_TABLET | Freq: Once | ORAL | Status: AC
  Administered 2012-04-10: 40 meq via ORAL

## 2012-04-10 MED ORDER — LIDOCAINE HCL (PF) 1 % IJ SOLN
INTRAMUSCULAR | Status: AC
  Filled 2012-04-10: qty 30

## 2012-04-10 MED ORDER — SODIUM CHLORIDE 0.9 % IJ SOLN: 3.0000 mL | Freq: Two times a day (BID) | INTRAMUSCULAR | Status: DC

## 2012-04-10 NOTE — H&P (View-Only) (Signed)
Primary Cardiologist:  Dr. Arvilla Meres  HPI:   [name] is a delightful 78 year old male with history congestive heart failure secondary to ischemic cardiomyopathy.  He has a totally occluded LAD on cardiac catheterization with nonobstructive disease elsewhere.  His EF previously in the 25-30% range. However echo 04/2010 showed EF 45% with mod-sev MR. He is status post BiV ICD.  He has also had problems with atrial fibrillation and undergone several cardioversions (and several anti-arrhhtymics) but now with chronic atrial fib.  He has class 2 symptoms.  In May 2012 admitted with failure to thrive symptoms. PICC line placed and co-ox was 56%. We discussed possible trial of milrinone but decided to take a watch and wait approach.   12/2011: ECHO: EF 35-40%, hypokinesis of apical myocardium and septal myocardium. Moderate MR with valve area 1.85 cm^2.  LA severely dilated, RA moderately dilated.  Here for a work in visit today.  He has felt terrible the last 2 days.  Says he has had no energy and wanted to sleep more.  Per nurse at [facility] this morning the pt was dyspneic and flushed after walking to the bathroom, SBP low 80s so they were going to bring him to the ER but came to clinic instead.  He has been taking lasix 80 mg in the morning for last 3 weeks (usually takes 80 mg alternating with 40 mg) besides 3-4 days taking his usual 40 mg daily.  2 days ago he called and weight was up to 157.8 pounds and he was instructed to take metolazone 2.5 mg for 2 days. He is only down 1.5 pounds and feeling bad.  He denies dizziness/syncope.  +cough with some productive sputum.  No fever/chills.  No bleeding.  Optival today: Fluid index has been on the rise since mid April.  Pts activity has been slightly down since the beginning of May.  V rate has been elevated >100 bpm 6 days since the end of April.     ROS: All other systems normal except as listed in the HPI and Problem List.   Past Medical  History  Diagnosis Date  . Systolic CHF, chronic   . Ischemic cardiomyopathy     a.  echo 8/08: EF 20-30%;  b. echo 5/11: EF 45%, mod to severe MR  . Permanent atrial fibrillation     Amiodarone stopped due to recurrent AFib  . CAD (coronary artery disease)     a. cath 9/06: occluded LAD with L-L and R-L collaterals; LM 20%, CFX 20%, OM1 30%, RCA 20-30%, EF 45%  . Mitral regurgitation     a. mod to severe by echo 5/11  . Anemia     2/2 GI bleed  . Symptomatic bradycardia     a. s/p BiV-ICD;  b. s/p change to BiV pacer only 12/11  . HTN (hypertension)   . HLD (hyperlipidemia)   . Osteoporosis   . BPH (benign prostatic hypertrophy)   . Glaucoma   . Urinary retention   . Depression   . Cataracts, bilateral     Retinal bleeding after cataract removal  . Glaucoma   . Hiatal hernia     Current Outpatient Prescriptions  Medication Sig Dispense Refill  . azelastine (ASTELIN) 137 MCG/SPRAY nasal spray 1 SPRAY EACH NOSTRIL EVERY MORNING  30 mL  2  . cetirizine (ZYRTEC) 10 MG tablet Take 10 mg by mouth daily.        . clonazePAM (KLONOPIN) 0.5 MG tablet Take 0.5 mg  by mouth 2 (two) times daily as needed.        . doxazosin (CARDURA) 4 MG tablet TAKE 1 TABLET AT BEDTIME  30 tablet  5  . finasteride (PROSCAR) 5 MG tablet TAKE 1 TABLET EVERY DAY  30 tablet  5  . fluticasone (FLONASE) 50 MCG/ACT nasal spray 1 SPRAY EACH NOSTRIL EVERY MORNING  16 g  2  . furosemide (LASIX) 40 MG tablet Take 80 mg by mouth daily.       Marland Kitchen lisinopril (PRINIVIL,ZESTRIL) 10 MG tablet TAKE 1/2 TABLET BY MOUTH DAILY  90 tablet  4  . Melatonin 3 MG CAPS Take by mouth at bedtime.        . metolazone (ZAROXOLYN) 2.5 MG tablet Take 2.5 mg by mouth daily as needed.      . mirtazapine (REMERON) 15 MG tablet TAKE 1/2 TO 1 TABLET AT BEDTIME AS NEEDED  30 tablet  5  . Multiple Vitamins-Minerals (OCUVITE PO) Take by mouth daily.        Marland Kitchen omeprazole (PRILOSEC) 20 MG capsule TAKE ONE CAPSULE EVERY DAY  30 capsule  5  .  potassium chloride SA (K-DUR,KLOR-CON) 20 MEQ tablet Take 20 mEq by mouth every other day.      . pravastatin (PRAVACHOL) 40 MG tablet TAKE 1 TABLET AT BEDTIME  30 tablet  5  . sertraline (ZOLOFT) 50 MG tablet TAKE 1 TABLET EVERY DAY  30 tablet  6  . Tamsulosin HCl (FLOMAX) 0.4 MG CAPS TAKE 2 CAPSULES EVERY DAY  60 capsule  5  . Vitamin D, Ergocalciferol, (DRISDOL) 50000 UNITS CAPS TAKE 1 CAPSULE EVERY FRIDAY  12 capsule  2  . warfarin (COUMADIN) 5 MG tablet Take 1 tablet (5 mg total) by mouth as directed.  35 tablet  3  . metolazone (ZAROXOLYN) 2.5 MG tablet Take 1 tablet (2.5 mg total) by mouth daily.  2 tablet  0    Allergies  Allergen Reactions  . Penicillins      Vital Signs: BP 82/58  Pulse 80  Wt 161 lb (73.029 kg)  SpO2 96%  PHYSICAL EXAM: Elderly kyphotic in no acute distress. Looks good HEENT: normal Neck: JVP 10-11, +v waves.  Carotids 2+ bilaterally Cardiac:  normal S1, S2; RRR; 3/6 MR; No S3 Lungs:  Course breath sounds at the bases bilaterally, no wheezing, rhonchi or rales Abd: soft, nontender, no hepatomegaly Ext: 1+ bilateral ankle edema. 1+ Rt > lt edema bilateral lower extremities.   Bilateral ted hose in place Skin: warm and dry;  chest/upper abdomen, no erythema Neuro:  CNs 2-12 intact, no focal abnormalities noted Psych: Normal affect    ASSESSMENT AND PLAN:

## 2012-04-10 NOTE — Interval H&P Note (Signed)
History and Physical Interval Note:  04/10/2012 8:05 AM  [name]  has presented today for surgery, with the diagnosis of CHF  The various methods of treatment have been discussed with the patient and family. After consideration of risks, benefits and other options for treatment, the patient has consented to  Procedure(s) (LRB): RIGHT HEART CATH (N/A) as a surgical intervention .  The patients' history has been reviewed, patient examined, no change in status, stable for surgery.  I have reviewed the patients' chart and labs.  Questions were answered to the patient's satisfaction.     Takiyah Bohnsack

## 2012-04-10 NOTE — Op Note (Signed)
Cardiac Cath Procedure Note:  Indication:   Procedures performed:  1) Right heart catheterization  Description of procedure:   The risks and indication of the procedure were explained. Consent was signed and placed on the chart. An appropriate timeout was taken prior to the procedure. The right groin was prepped and draped in the routine sterile fashion and anesthetized with 1% local lidocaine.   A 7 FR venous sheath was placed in the right femoral vein using a modified Seldinger technique. A standard Swan-Ganz catheter was used for the procedure. It was a very difficult procedure given the size of the RA and angle. We finally we are to get the S-tipped catheter into the PA an obtain a LUL wedge.  Complications: None apparent.  Findings:  RA = 11 RV =  44/5/9 PA =  51/25 (37) PCW = 23 v = 36 Fick cardiac output/index = 4.3/2.4 PVR = 3.0 Woods FA sat = 96% PA sat = 61%, 59%  Assessment:  1. Very mildly elevated R and L sided pressures with normal cardiac output 2. Prominent v-waves suggestive of significant  Plan/Discussion:  Continue current therapy. Doubt his fatigue is coming solely from his HF. MR likely playing role in dyspnea.  No role for inotropes at this point.    [name] 8:55 AM

## 2012-04-14 ENCOUNTER — Ambulatory Visit (INDEPENDENT_AMBULATORY_CARE_PROVIDER_SITE_OTHER): Payer: Medicare Other | Admitting: Internal Medicine

## 2012-04-14 DIAGNOSIS — I4891 Unspecified atrial fibrillation: Secondary | ICD-10-CM

## 2012-04-14 DIAGNOSIS — Z7901 Long term (current) use of anticoagulants: Secondary | ICD-10-CM

## 2012-04-14 LAB — POCT INR: INR: 1.7

## 2012-04-17 ENCOUNTER — Encounter: Payer: Self-pay | Admitting: *Deleted

## 2012-04-24 ENCOUNTER — Telehealth: Payer: Self-pay | Admitting: Internal Medicine

## 2012-04-24 NOTE — Telephone Encounter (Signed)
New msg Pt's wife wants to discuss transmission Please call

## 2012-04-27 NOTE — Telephone Encounter (Signed)
Spoke with daughter, advised Optivol on last transmission was okay.  Not crossing significant level at this point.   She reports that he is gaining 3 pounds on occasion and is concerned.  They just saw Dr Gala Romney.  Advised to call Meredith Staggers tomorrow in CHF clinic for advice.  They are aware to go to ER with acute symptoms.

## 2012-04-28 ENCOUNTER — Telehealth (HOSPITAL_COMMUNITY): Payer: Self-pay | Admitting: *Deleted

## 2012-04-28 ENCOUNTER — Telehealth: Payer: Self-pay | Admitting: Cardiology

## 2012-04-28 ENCOUNTER — Ambulatory Visit: Payer: Self-pay | Admitting: Cardiology

## 2012-04-28 ENCOUNTER — Telehealth: Payer: Self-pay | Admitting: Internal Medicine

## 2012-04-28 DIAGNOSIS — I4891 Unspecified atrial fibrillation: Secondary | ICD-10-CM

## 2012-04-28 DIAGNOSIS — Z7901 Long term (current) use of anticoagulants: Secondary | ICD-10-CM

## 2012-04-28 LAB — POCT INR: INR: 4.3

## 2012-04-28 NOTE — Telephone Encounter (Signed)
Pt's wife was contacted re coumadin level, and she didn't understand what he was to do, pls call dtr

## 2012-04-28 NOTE — Telephone Encounter (Signed)
error 

## 2012-04-28 NOTE — Telephone Encounter (Signed)
Spoke with [name] daughter. [name] weight trending up 157 pounds with increased lower extremity edema. Instructed to take Metolazone 2.5 mg daily for the next two days with scheduled Lasix. Also instructed to take additional KDur 20 meq for the next two days. I have asked his daughter to call us back tomorrow with an update.

## 2012-04-28 NOTE — Telephone Encounter (Signed)
Ms Loreta Ave called today.  She is concerned for her father.  His weight has gone up over the weekend to 158.2.  He has no energy and has not been eating or drinking.  His pulse was 55 and his B/P was 113/70 this am.  Now his B/P is 119/67 and his pulse is 67. They are concerned his pacemaker is not working properly.  He also has a producing cough.  Please call her back.

## 2012-04-29 NOTE — Telephone Encounter (Signed)
Routed to coumadin clinic for f/u

## 2012-04-29 NOTE — Telephone Encounter (Signed)
Called spoke with pt's daughter, pt's wife's memory is declining and she did not remember what Coumadin instructions were given for pt yesterday.  Daughter requests in the future to speak with her or the pt regarding Coumadin instructions.  Advised we instructed her to have pt hold x 1 dose, then start taking 1 tablet daily except 1/2 tablet once weekly and wanted the half tablet to follow the skipped dosage to let pt's INR decrease a little more than just 1 skipped dosage. Pt's daughter states pt is not eating much and is very edematous advised her this is most likely the reason pt's INR is elevated at this time. Advised we recommended and faxed orders to [facility] to recheck INR next week.  Pt's daughter verbalizes understanding.

## 2012-05-01 ENCOUNTER — Ambulatory Visit (INDEPENDENT_AMBULATORY_CARE_PROVIDER_SITE_OTHER): Payer: Medicare Other | Admitting: Internal Medicine

## 2012-05-01 ENCOUNTER — Encounter: Payer: Self-pay | Admitting: Internal Medicine

## 2012-05-01 VITALS — BP 110/72 | HR 92 | Temp 97.5°F | Wt 159.8 lb

## 2012-05-01 DIAGNOSIS — J309 Allergic rhinitis, unspecified: Secondary | ICD-10-CM

## 2012-05-01 DIAGNOSIS — S81801A Unspecified open wound, right lower leg, initial encounter: Secondary | ICD-10-CM

## 2012-05-01 DIAGNOSIS — S81009A Unspecified open wound, unspecified knee, initial encounter: Secondary | ICD-10-CM

## 2012-05-01 DIAGNOSIS — F329 Major depressive disorder, single episode, unspecified: Secondary | ICD-10-CM

## 2012-05-01 DIAGNOSIS — F3289 Other specified depressive episodes: Secondary | ICD-10-CM

## 2012-05-01 MED ORDER — FEXOFENADINE HCL 180 MG PO TABS: 180.0000 mg | ORAL_TABLET | Freq: Every day | ORAL | Status: DC

## 2012-05-01 MED ORDER — SERTRALINE HCL 100 MG PO TABS: 100.0000 mg | ORAL_TABLET | Freq: Every day | ORAL | Status: DC

## 2012-05-01 MED ORDER — MIRTAZAPINE 15 MG PO TABS: 15.0000 mg | ORAL_TABLET | Freq: Every day | ORAL | Status: DC

## 2012-05-01 NOTE — Progress Notes (Signed)
Subjective:    Patient ID: [name], male    DOB: [birth year], 78 y.o.   MRN: [id number]  HPI  complains of "sore" on RLE shin - present 6 mo No larger but not healing -   Also reviewed chronic medical issues:   dyslipidemia - on statin -reports compliance with ongoing medical treatment and no changes in medication dose or frequency. denies adverse side effects related to current therapy.   HTN - reports compliance with ongoing medical treatment and no changes in medication dose or frequency. denies adverse side effects related to current therapy. no headache or vision changes  chronic syst CHF - last hosp 04/2011 reviewed - follows regularly with cards for same -reports compliance with ongoing medical treatment and no changes in medication dose or frequency. denies adverse side effects related to current therapy. weight stable - no change chronic swelling of legs or SOB   PAF -  off amio, rate control only - chronic coumadin with LeB CC. no bleeding since GIB; no change in bruising -   depression - initially improved energy and fatigue since starting zoloft 10/2010, now "all back" - also, was sleeping better with remeron - ?increase dose  Past Medical History  Diagnosis Date  . Systolic CHF, chronic   . Ischemic cardiomyopathy     a.  echo 8/08: EF 20-30%;  b. echo 5/11: EF 45%, mod to severe MR  . Permanent atrial fibrillation     Amiodarone stopped due to recurrent AFib  . CAD (coronary artery disease)     a. cath 9/06: occluded LAD with L-L and R-L collaterals; LM 20%, CFX 20%, OM1 30%, RCA 20-30%, EF 45%  . Mitral regurgitation     a. mod to severe by echo 5/11  . Anemia     2/2 GI bleed  . Symptomatic bradycardia     a. s/p BiV-ICD;  b. s/p change to BiV pacer only 12/11  . HTN (hypertension)   . HLD (hyperlipidemia)   . Osteoporosis   . BPH (benign prostatic hypertrophy)   . Glaucoma   . Urinary retention   . Depression   . Cataracts, bilateral     Retinal  bleeding after cataract removal  . Glaucoma   . Hiatal hernia     Review of Systems  Constitutional: Negative for fever and unexpected weight change.  Cardiovascular: Negative for chest pain and palpitations.      Objective:   Physical Exam  BP 110/72  Pulse 92  Temp(Src) 97.5 F (36.4 C) (Oral)  Wt 159 lb 12.8 oz (72.485 kg)  SpO2 96%  Constitutional: oriented to person, place, and time. appears well-developed and well-nourished. No distress.  Wife and dtr at side Cardiovascular: Normal rate, regular rhythm and normal heart sounds.  No murmur heard. chronic 1+ edema BLE Pulmonary/Chest: Effort normal and breath sounds normal. No respiratory distress. no wheezes.  Abdominal: Soft. Bowel sounds are normal. Patient exhibits no distension. There is no tenderness.  Neurological: he is alert and oriented to person, place, and time. No cranial nerve deficit. Coordination normal.  Psychiatric: he has a normal mood and affect. behavior is normal. Judgment and thought content normal.  Skin 1 cm superficial ulceration, no cellulitis on anterior shin RLE  Lab Results  Component Value Date   WBC 7.1 04/10/2012   HGB 11.9* 04/10/2012   HCT 35.2* 04/10/2012   PLT 163 04/10/2012   CHOL 154 01/09/2011   TRIG 100.0 01/09/2011   HDL 61.50 01/09/2011  ALT 20 04/08/2012   AST 26 04/08/2012   NA 141 04/10/2012   K 3.2* 04/10/2012   CL 101 04/10/2012   CREATININE 1.27 04/10/2012   BUN 40* 04/10/2012   CO2 26 04/10/2012   TSH 1.08 07/03/2011   INR 4.3 04/28/2012      Assessment & Plan:   RLE skin ulcer - exacerbated by chronic lymphedema - no evidence of infection - will ask HH RN to eval wound for other topical care  allergic rhinitis - change meds   see problem list. Medications and labs reviewed today.

## 2012-05-01 NOTE — Assessment & Plan Note (Signed)
Suspect contributing to overall fatigue Increase sertraline and remeron dose now Reviewed meds and symptoms in depth with pt and family today

## 2012-05-01 NOTE — Patient Instructions (Addendum)
It was good to see you today. Increase sertraline to 100mg  each AM, mirtazepine to 15 mg at bedtime Stop Zyrtec and start Allergra for allergy ans sinus symptoms  Ok to start B complex vitamin we'll make referral to home health urse for your leg sore. Our office will contact you regarding appointment(s) once made. Please schedule followup in 2-3 months, call sooner if problems.

## 2012-05-05 ENCOUNTER — Ambulatory Visit: Payer: Self-pay | Admitting: Cardiovascular Disease

## 2012-05-05 DIAGNOSIS — Z7901 Long term (current) use of anticoagulants: Secondary | ICD-10-CM

## 2012-05-05 DIAGNOSIS — I4891 Unspecified atrial fibrillation: Secondary | ICD-10-CM

## 2012-05-05 LAB — POCT INR: INR: 3.2

## 2012-05-10 ENCOUNTER — Encounter (HOSPITAL_COMMUNITY): Payer: Self-pay | Admitting: Emergency Medicine

## 2012-05-10 ENCOUNTER — Emergency Department (HOSPITAL_COMMUNITY): Payer: Medicare Other

## 2012-05-10 ENCOUNTER — Inpatient Hospital Stay (HOSPITAL_COMMUNITY)
Admission: EM | Admit: 2012-05-10 | Discharge: 2012-05-18 | DRG: 293 | Disposition: A | Discharge status: Hospice | Payer: Medicare Other | Attending: Internal Medicine | Admitting: Internal Medicine

## 2012-05-10 DIAGNOSIS — Z95810 Presence of automatic (implantable) cardiac defibrillator: Secondary | ICD-10-CM

## 2012-05-10 DIAGNOSIS — I2589 Other forms of chronic ischemic heart disease: Secondary | ICD-10-CM | POA: Diagnosis present

## 2012-05-10 DIAGNOSIS — I10 Essential (primary) hypertension: Secondary | ICD-10-CM | POA: Diagnosis present

## 2012-05-10 DIAGNOSIS — E785 Hyperlipidemia, unspecified: Secondary | ICD-10-CM | POA: Diagnosis present

## 2012-05-10 DIAGNOSIS — I509 Heart failure, unspecified: Secondary | ICD-10-CM

## 2012-05-10 DIAGNOSIS — N400 Enlarged prostate without lower urinary tract symptoms: Secondary | ICD-10-CM | POA: Diagnosis present

## 2012-05-10 DIAGNOSIS — Z7901 Long term (current) use of anticoagulants: Secondary | ICD-10-CM

## 2012-05-10 DIAGNOSIS — I4891 Unspecified atrial fibrillation: Secondary | ICD-10-CM | POA: Diagnosis present

## 2012-05-10 DIAGNOSIS — E876 Hypokalemia: Secondary | ICD-10-CM | POA: Diagnosis present

## 2012-05-10 DIAGNOSIS — D649 Anemia, unspecified: Secondary | ICD-10-CM | POA: Diagnosis present

## 2012-05-10 DIAGNOSIS — I2510 Atherosclerotic heart disease of native coronary artery without angina pectoris: Secondary | ICD-10-CM | POA: Diagnosis present

## 2012-05-10 DIAGNOSIS — I5023 Acute on chronic systolic (congestive) heart failure: Principal | ICD-10-CM | POA: Diagnosis present

## 2012-05-10 DIAGNOSIS — R0600 Dyspnea, unspecified: Secondary | ICD-10-CM

## 2012-05-10 DIAGNOSIS — M810 Age-related osteoporosis without current pathological fracture: Secondary | ICD-10-CM | POA: Diagnosis present

## 2012-05-10 DIAGNOSIS — I059 Rheumatic mitral valve disease, unspecified: Secondary | ICD-10-CM

## 2012-05-10 DIAGNOSIS — Z66 Do not resuscitate: Secondary | ICD-10-CM | POA: Diagnosis present

## 2012-05-10 LAB — COMPREHENSIVE METABOLIC PANEL
ALT: 35 U/L (ref 0–53)
AST: 55 U/L — ABNORMAL HIGH (ref 0–37)
Albumin: 4 g/dL (ref 3.5–5.2)
CO2: 27 mEq/L (ref 19–32)
Chloride: 97 mEq/L (ref 96–112)
Creatinine, Ser: 1.23 mg/dL (ref 0.50–1.35)
Sodium: 141 mEq/L (ref 135–145)
Total Bilirubin: 0.9 mg/dL (ref 0.3–1.2)

## 2012-05-10 LAB — CBC
MCHC: 33.2 g/dL (ref 30.0–36.0)
Platelets: 205 10*3/uL (ref 150–400)
RDW: 14 % (ref 11.5–15.5)
WBC: 11 10*3/uL — ABNORMAL HIGH (ref 4.0–10.5)

## 2012-05-10 LAB — URINALYSIS, ROUTINE W REFLEX MICROSCOPIC
Bilirubin Urine: NEGATIVE
Glucose, UA: NEGATIVE mg/dL
Hgb urine dipstick: NEGATIVE
Protein, ur: 30 mg/dL — AB
Specific Gravity, Urine: 1.019 (ref 1.005–1.030)

## 2012-05-10 LAB — DIFFERENTIAL
Basophils Absolute: 0 10*3/uL (ref 0.0–0.1)
Basophils Relative: 0 % (ref 0–1)
Lymphocytes Relative: 11 % — ABNORMAL LOW (ref 12–46)
Monocytes Absolute: 1.3 10*3/uL — ABNORMAL HIGH (ref 0.1–1.0)
Neutro Abs: 8.4 10*3/uL — ABNORMAL HIGH (ref 1.7–7.7)
Neutrophils Relative %: 77 % (ref 43–77)

## 2012-05-10 LAB — URINE MICROSCOPIC-ADD ON

## 2012-05-10 MED ORDER — FINASTERIDE 5 MG PO TABS
5.0000 mg | ORAL_TABLET | Freq: Every day | ORAL | Status: DC
  Administered 2012-05-11 – 2012-05-18 (×8): 5 mg via ORAL
  Filled 2012-05-10 (×8): qty 1

## 2012-05-10 MED ORDER — OCUVITE PO TABS
1.0000 | ORAL_TABLET | Freq: Every day | ORAL | Status: DC
  Administered 2012-05-11 – 2012-05-18 (×8): 1 via ORAL
  Filled 2012-05-10 (×8): qty 1

## 2012-05-10 MED ORDER — MIRTAZAPINE 15 MG PO TABS
15.0000 mg | ORAL_TABLET | Freq: Every day | ORAL | Status: DC
  Administered 2012-05-11 – 2012-05-17 (×8): 15 mg via ORAL
  Filled 2012-05-10 (×9): qty 1

## 2012-05-10 MED ORDER — TAMSULOSIN HCL 0.4 MG PO CAPS
0.8000 mg | ORAL_CAPSULE | Freq: Every day | ORAL | Status: DC
  Administered 2012-05-11 – 2012-05-18 (×8): 0.8 mg via ORAL
  Filled 2012-05-10 (×8): qty 2

## 2012-05-10 MED ORDER — MELATONIN 3 MG PO CAPS: 3.0000 mg | ORAL_CAPSULE | Freq: Every day | ORAL | Status: DC

## 2012-05-10 MED ORDER — PANTOPRAZOLE SODIUM 40 MG PO TBEC
40.0000 mg | DELAYED_RELEASE_TABLET | Freq: Every day | ORAL | Status: DC
  Administered 2012-05-11 – 2012-05-18 (×8): 40 mg via ORAL
  Filled 2012-05-10 (×7): qty 1

## 2012-05-10 MED ORDER — B COMPLEX-C PO TABS
1.0000 | ORAL_TABLET | Freq: Every morning | ORAL | Status: DC
  Administered 2012-05-11 – 2012-05-18 (×8): 1 via ORAL
  Filled 2012-05-10 (×9): qty 1

## 2012-05-10 MED ORDER — LORATADINE 10 MG PO TABS
10.0000 mg | ORAL_TABLET | Freq: Every day | ORAL | Status: DC
  Administered 2012-05-11 – 2012-05-18 (×8): 10 mg via ORAL
  Filled 2012-05-10 (×8): qty 1

## 2012-05-10 MED ORDER — POTASSIUM CHLORIDE CRYS ER 20 MEQ PO TBCR
20.0000 meq | EXTENDED_RELEASE_TABLET | Freq: Two times a day (BID) | ORAL | Status: DC
  Filled 2012-05-10 (×3): qty 1

## 2012-05-10 MED ORDER — SIMVASTATIN 20 MG PO TABS
20.0000 mg | ORAL_TABLET | Freq: Every day | ORAL | Status: DC
  Administered 2012-05-11 – 2012-05-17 (×7): 20 mg via ORAL
  Filled 2012-05-10 (×8): qty 1

## 2012-05-10 MED ORDER — POTASSIUM CHLORIDE CRYS ER 20 MEQ PO TBCR
40.0000 meq | EXTENDED_RELEASE_TABLET | Freq: Once | ORAL | Status: AC
  Administered 2012-05-10: 40 meq via ORAL
  Filled 2012-05-10: qty 2

## 2012-05-10 MED ORDER — ACETAMINOPHEN 325 MG PO TABS: 650.0000 mg | ORAL_TABLET | ORAL | Status: DC | PRN

## 2012-05-10 MED ORDER — ONDANSETRON HCL 4 MG/2ML IJ SOLN: 4.0000 mg | Freq: Four times a day (QID) | INTRAMUSCULAR | Status: DC | PRN

## 2012-05-10 MED ORDER — SODIUM CHLORIDE 0.9 % IJ SOLN
3.0000 mL | Freq: Two times a day (BID) | INTRAMUSCULAR | Status: DC
  Administered 2012-05-11 – 2012-05-18 (×12): 3 mL via INTRAVENOUS

## 2012-05-10 MED ORDER — FUROSEMIDE 10 MG/ML IJ SOLN
80.0000 mg | Freq: Two times a day (BID) | INTRAMUSCULAR | Status: DC
  Administered 2012-05-11 – 2012-05-13 (×5): 80 mg via INTRAVENOUS
  Filled 2012-05-10 (×8): qty 8

## 2012-05-10 MED ORDER — SERTRALINE HCL 100 MG PO TABS
100.0000 mg | ORAL_TABLET | Freq: Every day | ORAL | Status: DC
  Administered 2012-05-11 – 2012-05-18 (×8): 100 mg via ORAL
  Filled 2012-05-10 (×8): qty 1

## 2012-05-10 MED ORDER — LISINOPRIL 5 MG PO TABS
5.0000 mg | ORAL_TABLET | Freq: Every day | ORAL | Status: DC
  Administered 2012-05-11 – 2012-05-18 (×8): 5 mg via ORAL
  Filled 2012-05-10 (×8): qty 1

## 2012-05-10 MED ORDER — WARFARIN SODIUM 2.5 MG PO TABS: 2.5000 mg | ORAL_TABLET | Freq: Every day | ORAL | Status: DC

## 2012-05-10 MED ORDER — SODIUM CHLORIDE 0.9 % IJ SOLN: 3.0000 mL | INTRAMUSCULAR | Status: DC | PRN

## 2012-05-10 MED ORDER — CLONAZEPAM 0.5 MG PO TABS
0.2500 mg | ORAL_TABLET | Freq: Two times a day (BID) | ORAL | Status: DC | PRN
  Administered 2012-05-16: 0.25 mg via ORAL
  Filled 2012-05-10: qty 1

## 2012-05-10 MED ORDER — SODIUM CHLORIDE 0.9 % IV SOLN
250.0000 mL | INTRAVENOUS | Status: DC | PRN
  Administered 2012-05-15: 07:00:00 via INTRAVENOUS

## 2012-05-10 MED ORDER — DOXAZOSIN MESYLATE 4 MG PO TABS
4.0000 mg | ORAL_TABLET | Freq: Every day | ORAL | Status: DC
  Administered 2012-05-11 – 2012-05-17 (×8): 4 mg via ORAL
  Filled 2012-05-10 (×9): qty 1

## 2012-05-10 MED ORDER — FUROSEMIDE 10 MG/ML IJ SOLN
40.0000 mg | Freq: Once | INTRAMUSCULAR | Status: AC
  Administered 2012-05-10: 40 mg via INTRAVENOUS
  Filled 2012-05-10: qty 4

## 2012-05-10 NOTE — H&P (Signed)
Primary Cardiologist: Dr. Gala Romney Corinda Gubler)  Patient Location: evaluated in the [facility] emergency room Bed 21  Chief Complaint:   HPI:  Mr Agena is a delightful 78 year old gentleman with coronary disease and an associated ischemic cardiomyopathy with NYHA Class IIIb symptoms who presents to the emergency department with progressive fatigue and shortness of breath. He is accompanied in the ED by his wife and his daughter who helped to provide the history.   Over the course of the past several months, Mr. Mcintyre has experienced a steady decline in his functional status. He has gone from being able to walk "several hallways" to being breathless after walking from one room to another in his home. He specifically says that in the past two weeks, he feels worn out after getting dressed. He denies chest pain or tightness and similarly denies nausea or diaphoresis. He has felt early satiety of late, together with worsening leg swelling and consistently-present orthopnea. He has been taking 80mg  of Furosemide daily and has taken Metolazone three times in the past two weeks in an effort to supplement his diuresis. He does feel the Metolazone helps with urine output, but he does not feel like it has helped his symptoms of late.   He has been closely and carefully followed by Dr. Gala Romney in heart failure clinic. A right heart cath in May revealed elevated right and left heart filling pressures (see below) with an adequate cardiac index (2.4).   Past Medical History Coronary artery disease - Cath 2006: occluded LAD with L-L, R-L collaterals; non-obstructive disease LM/LCx/RCA Ischemic Cardiomyopathy - s/p BiV-ICD placement - TTE (12/2011): EF 35-40% with mild LV dilatation; mild AR, Moderate-severe MR, severe LA dilatation - RHCath in May 2013 by Dr. Gala Romney: RA = 11  RV = 44/5/9  PA = 51/25 (37)  PCW = 23 v = 36  Fick cardiac output/index = 4.3/2.4  PVR = 3.0 Woods  FA sat = 96%  PA sat =  61%, 59%  Atrial Fibrillation on chronic anti-coagulation Mitral Regurgitation Anemia with history of GI bleed Hypertension Hyperlipidemia Osteoporosis BPH Depression Hiatal Hernia Cataracts  Social History Non-smoker No EtOH or illicits Married Accompanied tonight by wife and daughter  Family History Non-contributory to this presentation  Allergies: Penicillin  Medications: Warfarin 2.5mg  on Wednesday & Sunday Warfarin 5mg  on other days Clonazepam 0.5mg   Doxazosin 4mg  at bedtime Fexofenadine 180mg  daily Finasteride 5mg  daily Furosemide 40-80mg  daily Metolazone 2.5mg  PRN Potassium Cl daily Mirtazepine 15mg  at bedtime Omeprazole 20mg  daily Ocuvite 1 tablet Pravastatin 40mg  daily Tamsulosin 0.4mg  daily Melatonin 3mg  Vitamin B/C  Exam Afebrile 130/72 89 15 98% 2L [place] GEN no acute distress, speaking in single sentences; no accessory muscle use with breathing NECK JVP  At approx 15cm H20, no carotid bruits CHEST bibasilar crackles; no wheezing CARDIAC regular S1 S2, with III/VI systolic murmur heard best at the apex ABDOMEN soft, mildly distended without focal tenderness EXT warm and well-perfused; 2+ pitting edema in LE bilaterally SKIN warm & dry NEURO A&Ox3  Labs: ProBNP 3800 INR 2.6 WBC 11K Hgb 12.1 Plts 205K Na 141 K 3.1 Cl 97 CO2 27 Glu 156 BUN 32 Cr 1.2 AST 55 ALT 35  ECG:V-paced at a rate of 70  CXR: enlarged cardiac silhouette; biV ICD placement with pulmonary vascular congestion present  Assessment/Plan 78 year old gentleman with coronary disease and an associated ischemic cardiomyopathy who presents with acute decompensated heart failure (ADHF) manifesting as exertional dyspnea and fatigue. He is warm  and wet on exam with clinical and laboratory evidence of adequate end-organ perfusion. He is impressively symptomatic and appears to be experiencing a progressive decline in his functional status. He needs inpatient admission for  IV diuresis so that the volume overload component of his fatigue can be mitigated. Based on his RHCath numbers from back in May, it does not yet appear that he is a candidate for inotropic therapy. Our focus will thus necessarily be on diuresis and addressing any other contributing factors to his fatigue.  1) ADHF: warm & wet - IV diuresis with close attention to his electrolytes. Given his admission K of 3.1, he received 40 mEq of KCl prior to his first dose of IV Furosemide (40mg ) in the ED. I have written him for Furosemide 80mg  IV BID to begin at 10AM today. I have also written him for standing K repletion ( BID) in an effort to avoid hypokalemia.  - Should he not adequately respond to loop diuretics alone, we can then add a periodically dosed thiazide-type agent like Metolazone to his inpatient regimen. I would prefer to do this once we've adequately repleted his potassium and once we've assessed his response to IV loop diuretic.  - Continued afterload reduction with Lisinopril 5mg  - Not currently on beta-blockade presumably given his advanced cardiomyopathy  2) CAD - Not on anti-platelet therapy given his chronic systemic anticoagulation for Afib - No clinical need for cardiac enzyme trending at this time  3) Atrial fibrillation - Continue Warfarin therapy for now as I do not anticipate invasive procedures during this admission - In the unlikely event that he proves refractory to IV diuresis, Warfarin could then be held in case a repeat RHCath would then help to guide therapy.   I discussed my impressions with the patient and with his family. Their questions were answered to the best of my ability.   Zacarias Pontes, MD Cardiology Fellow On-Call [phone]

## 2012-05-10 NOTE — ED Notes (Signed)
Pt. C/o of weakness/fatigue x1 week. Per daughter, 2 weeks ago increase zoloft medicine. Also c/o SOB x1 week, states that "even getting dressed wears me out".  Denies pain.

## 2012-05-10 NOTE — ED Notes (Signed)
Cardiologist at bedside.  

## 2012-05-10 NOTE — ED Provider Notes (Signed)
History     CSN: [id number]  Arrival date & time 05/10/12  1452   First MD Initiated Contact with Patient 05/10/12 1650      Chief Complaint  Patient presents with  . Fatigue    Patient is a 78 y.o. male presenting with weakness. The history is provided by the patient, the spouse, a relative and a caregiver.  Weakness Primary symptoms do not include headaches, seizures, dizziness, focal weakness, fever, nausea or vomiting. Primary symptoms comment: Patient complains of 1 week of generalized weakness; patient's Remeron and Zoloft dosages were doubled 1 week prior to symptoms The symptoms began more than 1 week ago. The symptoms are unchanged. The neurological symptoms are diffuse. Context: Remeron and Zoloft dosages were doubled 2 weeks ago.  Additional symptoms include weakness (generalized). Additional symptoms do not include neck stiffness, lower back pain or photophobia.    Past Medical History  Diagnosis Date  . Systolic CHF, chronic   . Ischemic cardiomyopathy     a.  echo 8/08: EF 20-30%;  b. echo 5/11: EF 45%, mod to severe MR  . Permanent atrial fibrillation     Amiodarone stopped due to recurrent AFib  . CAD (coronary artery disease)     a. cath 9/06: occluded LAD with L-L and R-L collaterals; LM 20%, CFX 20%, OM1 30%, RCA 20-30%, EF 45%  . Mitral regurgitation     a. mod to severe by echo 5/11  . Anemia     2/2 GI bleed  . Symptomatic bradycardia     a. s/p BiV-ICD;  b. s/p change to BiV pacer only 12/11  . HTN (hypertension)   . HLD (hyperlipidemia)   . Osteoporosis   . BPH (benign prostatic hypertrophy)   . Glaucoma   . Urinary retention   . Depression   . Cataracts, bilateral     Retinal bleeding after cataract removal  . Glaucoma   . Hiatal hernia     Past Surgical History  Procedure Date  . Cardiac defibrillator placement   . Cataract extraction   . Hernia repair   . Hand surgery   . Eye surgery 2009 & 2010    History reviewed. No pertinent family  history.  History  Substance Use Topics  . Smoking status: Never Smoker   . Smokeless tobacco: Not on file   Comment: Married lives with wife, lives [facility]. 3 children involved in care-bro in [place], 2 sis in town  . Alcohol Use: No      Review of Systems  Constitutional: Negative for fever, chills, diaphoresis, activity change and appetite change.  HENT: Negative for neck pain and neck stiffness.   Eyes: Negative for photophobia.  Respiratory: Positive for shortness of breath (at baseline; especially when supine or with exertion). Negative for cough, chest tightness and wheezing.   Cardiovascular: Negative for chest pain, palpitations and leg swelling.  Gastrointestinal: Negative for nausea, vomiting, abdominal pain, diarrhea and constipation.  Neurological: Positive for weakness (generalized). Negative for dizziness, focal weakness, seizures, syncope, light-headedness, numbness and headaches.  All other systems reviewed and are negative.    Allergies  Penicillins  Home Medications   Current Outpatient Rx  Name Route Sig Dispense Refill  . SUPER B COMPLEX/VITAMIN C PO Oral Take 1 tablet by mouth daily.    Marland Kitchen CLONAZEPAM 0.5 MG PO TABS Oral Take 0.25 mg by mouth 2 (two) times daily as needed. anxiety    . DOXAZOSIN MESYLATE 4 MG PO TABS  Oral Take 4 mg by mouth at bedtime.    . ERGOCALCIFEROL 50000 UNITS PO CAPS Oral Take 50,000 Units by mouth once a week. On Fridays    . FEXOFENADINE HCL 180 MG PO TABS Oral Take 1 tablet (180 mg total) by mouth daily. 30 tablet 5  . FINASTERIDE 5 MG PO TABS Oral Take 5 mg by mouth daily.    . FUROSEMIDE 40 MG PO TABS Oral Take 40-80 mg by mouth daily.     Marland Kitchen MELATONIN 3 MG PO CAPS Oral Take 3 mg by mouth at bedtime.     Marland Kitchen METOLAZONE 2.5 MG PO TABS Oral Take 2.5 mg by mouth daily as needed. Additional fluid    . MIRTAZAPINE 15 MG PO TABS Oral Take 1 tablet (15 mg total) by mouth at bedtime. 30 tablet 3  . OCUVITE PO Oral  Take 1 tablet by mouth daily.     Marland Kitchen OMEPRAZOLE 20 MG PO CPDR Oral Take 20 mg by mouth daily.    Marland Kitchen POTASSIUM CHLORIDE CRYS ER 20 MEQ PO TBCR Oral Take 20 mEq by mouth daily.    Marland Kitchen PRAVASTATIN SODIUM 40 MG PO TABS Oral Take 40 mg by mouth daily.    . SERTRALINE HCL 100 MG PO TABS Oral Take 1 tablet (100 mg total) by mouth daily. 30 tablet 6  . TAMSULOSIN HCL 0.4 MG PO CAPS Oral Take 0.8 mg by mouth daily.     . WARFARIN SODIUM 5 MG PO TABS Oral Take 2.5-5 mg by mouth daily. 1/2 tablet (2.5 mg) on Wednesday and Sunday, 1 tablet (5 mg) on remainder of days      BP 126/82  Pulse 82  Temp(Src) 98.1 F (36.7 C) (Oral)  Resp 18  SpO2 98%  Physical Exam  Nursing note and vitals reviewed. Constitutional: He appears well-developed and well-nourished.  HENT:  Head: Normocephalic and atraumatic.  Right Ear: External ear normal.  Left Ear: External ear normal.  Nose: Nose normal.  Mouth/Throat: Oropharynx is clear and moist. No oropharyngeal exudate.  Eyes: Conjunctivae and EOM are normal. Pupils are equal, round, and reactive to light.  Neck: Normal range of motion. Neck supple. JVD present. No tracheal deviation present. No thyromegaly present.  Cardiovascular: Normal rate, regular rhythm, normal heart sounds and intact distal pulses.        Decreased   Pulmonary/Chest: Effort normal. No stridor. No respiratory distress. He has no wheezes. He has rales (bibasilar). He exhibits no tenderness.  Abdominal: Soft. Bowel sounds are normal. He exhibits no distension and no mass. There is no tenderness. There is no rebound and no guarding.  Musculoskeletal: Normal range of motion. He exhibits edema (bilateral 3+ edema). He exhibits no tenderness.  Neurological: He is alert. He displays normal reflexes. No cranial nerve deficit. He exhibits normal muscle tone. Coordination normal.  Skin: Skin is warm and dry. Rash (overlying left foot) noted. There is erythema (overlying left foot). No pallor.    Psychiatric: He has a normal mood and affect. His behavior is normal. Judgment and thought content normal.    ED Course  Procedures (including critical care time)  Labs Reviewed  CBC - Abnormal; Notable for the following:    WBC 11.0 (*)    RBC 3.66 (*)    Hemoglobin 12.1 (*)    HCT 36.4 (*)    All other components within normal limits  DIFFERENTIAL - Abnormal; Notable for the following:    Neutro Abs 8.4 (*)  Lymphocytes Relative 11 (*)    Monocytes Absolute 1.3 (*)    All other components within normal limits  COMPREHENSIVE METABOLIC PANEL - Abnormal; Notable for the following:    Potassium 3.1 (*)    Glucose, Bld 156 (*)    BUN 32 (*)    AST 55 (*)    GFR calc non Af Amer 52 (*)    GFR calc Af Amer 60 (*)    All other components within normal limits  URINALYSIS, ROUTINE W REFLEX MICROSCOPIC - Abnormal; Notable for the following:    APPearance CLOUDY (*)    Protein, ur 30 (*)    All other components within normal limits  PROTIME-INR - Abnormal; Notable for the following:    Prothrombin Time 28.4 (*)    INR 2.62 (*)    All other components within normal limits  PRO B NATRIURETIC PEPTIDE - Abnormal; Notable for the following:    Pro B Natriuretic peptide (BNP) 3888.0 (*)    All other components within normal limits  URINE MICROSCOPIC-ADD ON - Abnormal; Notable for the following:    Squamous Epithelial / LPF FEW (*)    Casts HYALINE CASTS (*)    All other components within normal limits  URINE CULTURE   Dg Chest 2 View  05/10/2012  *RADIOLOGY REPORT*  Clinical Data: Weakness with fatigue for 1 week.  CHEST - 2 VIEW  Comparison: 04/08/2011 and 04/07/2011 radiographs.  Findings: Left subclavian pacemaker leads appear unchanged within the right atrium, right ventricle and coronary sinus.  There is stable cardiomegaly.  Overall pulmonary aeration has improved. There is a hiatal hernia extending over the left hemidiaphragm which appears unchanged.  No acute osseous findings  are evident.  IMPRESSION: Improved basilar aeration compared with most recent examination with stable cardiomegaly.  No acute cardiopulmonary process demonstrated.  Original Report Authenticated By: Gerrianne Scale, M.D.     1. Acute exacerbation of congestive heart failure   2. Dyspnea      Date: 05/10/2012  Rate: 77 bpm  Rhythm: Ventricular-paced rhythm  QRS Axis: left  Intervals: normal  ST/T Wave abnormalities: Non-specific T wave abnormalities  Conduction Disutrbances:Ventricularly-paced rhythm  Narrative Interpretation: No evidence of acute ischemia or arrythmia  Old EKG Reviewed: unchanged (04/10/12)    MDM  78 yo M w/hx of depression and ischemic cardiomyopathy (EF 35%) presents with generalized weakness, peripheral edema, and worsening dyspnea on exertion. Clinical picture (bilbasilar effusions, peripheral edema, JVD, significantly elevated BNP) c/w acute CHF exacerbation. IV lasix given. PO KCl given for hypokalemia. Cardiology consulted and will admit.   Clemetine Marker, MD 05/10/12 2239

## 2012-05-10 NOTE — ED Notes (Signed)
Pt. Oxygen sat. Dropped into the 80s with ambulation, averaging 84-85%. Once sitting in bed again, pulse ox increased back to 100%

## 2012-05-10 NOTE — ED Notes (Signed)
Pt reports feeling fatigue x 1 week. Pt reports recently increased his medication. Pt has no appetite or energy.

## 2012-05-10 NOTE — ED Notes (Signed)
Bilateral DP and PT pulses found with doppler.  Sites marked.

## 2012-05-10 NOTE — ED Notes (Signed)
Pt ambulated with a walker to bathroom. Pulse ox dropped from 95 to 86 when getting out of bed. Fluctuated between 91 and 86 during ambulation. Pt stated he did not feel dizzy or lightened. No shortness of breath

## 2012-05-11 ENCOUNTER — Telehealth: Payer: Self-pay

## 2012-05-11 DIAGNOSIS — I5023 Acute on chronic systolic (congestive) heart failure: Principal | ICD-10-CM

## 2012-05-11 DIAGNOSIS — I059 Rheumatic mitral valve disease, unspecified: Secondary | ICD-10-CM

## 2012-05-11 DIAGNOSIS — I4891 Unspecified atrial fibrillation: Secondary | ICD-10-CM

## 2012-05-11 LAB — PROTIME-INR: Prothrombin Time: 28.8 seconds — ABNORMAL HIGH (ref 11.6–15.2)

## 2012-05-11 LAB — BASIC METABOLIC PANEL
CO2: 29 mEq/L (ref 19–32)
Calcium: 9.4 mg/dL (ref 8.4–10.5)
GFR calc non Af Amer: 59 mL/min — ABNORMAL LOW (ref >90)
Sodium: 143 mEq/L (ref 135–145)

## 2012-05-11 LAB — MAGNESIUM: Magnesium: 2.2 mg/dL (ref 1.5–2.5)

## 2012-05-11 MED ORDER — WARFARIN SODIUM 2.5 MG PO TABS
2.5000 mg | ORAL_TABLET | ORAL | Status: DC
  Filled 2012-05-11: qty 1

## 2012-05-11 MED ORDER — POTASSIUM CHLORIDE CRYS ER 20 MEQ PO TBCR
40.0000 meq | EXTENDED_RELEASE_TABLET | Freq: Two times a day (BID) | ORAL | Status: DC
  Administered 2012-05-11 – 2012-05-12 (×3): 40 meq via ORAL
  Filled 2012-05-11 (×3): qty 2

## 2012-05-11 MED ORDER — WARFARIN - PHYSICIAN DOSING INPATIENT
Freq: Every day | Status: DC
  Administered 2012-05-12: 18:00:00

## 2012-05-11 MED ORDER — METOLAZONE 2.5 MG PO TABS
2.5000 mg | ORAL_TABLET | ORAL | Status: AC
  Administered 2012-05-11: 2.5 mg via ORAL
  Filled 2012-05-11: qty 1

## 2012-05-11 MED ORDER — WARFARIN SODIUM 5 MG PO TABS
5.0000 mg | ORAL_TABLET | ORAL | Status: DC
  Administered 2012-05-11 – 2012-05-12 (×3): 5 mg via ORAL
  Filled 2012-05-11 (×3): qty 1

## 2012-05-11 NOTE — ED Provider Notes (Signed)
I saw and evaluated the patient, reviewed the resident's note and I agree with the findings and plan.  Hx ischemic cardiomyopathy. Fatigue with depression med adjustments.  DOE, SOB.  Bibasilar crackles, JVD, +3 edema, with erythema of L foot and ankle.  Glynn Octave, MD 05/11/12 [phone]

## 2012-05-11 NOTE — Telephone Encounter (Signed)
[facility] Triage Call Report Triage Record Num: [id number] Operator: Amy Head Patient Name: [name] Call Date & Time: 05/10/2012 1:27:15PM Patient Phone: [phone] PCP: Rene Paci Patient Gender: Male PCP Fax : [phone] Patient DOB: [birth year] Practice Name: [facility] Reason for Call: Caller: Berneta Levins; PCP: Rene Paci; CB#: [phone]; Call regarding Medication Issues; States that Dr. Felicity Coyer doubled Zoloft approx 1 week ago (D/T depression) and has been "down and out" since then. Pt has not been able to walk to the dining room, etc, feels weak and states that the only time he feels good is when he sleeps. Afebrile. Can stand but can not maneuver anywhere and has no energy to do anything. No other sxs. Has no appetite. Advised ED per guidelines. Protocol(s) Used: Weakness or Paralysis Recommended Outcome per Protocol: See ED Immediately Reason for Outcome: New onset of generalized weakness Care Advice: ~ Protect the patient from falling or other harm. ~ Another adult should drive. ~ IMMEDIATE ACTION Write down provider's name. List or place the following in a bag for transport with the patient: current prescription and/or nonprescription medications; alternative treatments, therapies and medications; and street drugs. ~ May have clear liquids (such as water, clear fruit juices without pulp, soda, tea or coffee without dairy or non-dairy creamer, clear broth or bouillon, oral hydration solution, or plain gelatin, fruit ices/popsicles, hard candy) but do not eat solid foods before being seen by your provider. ~ Call EMS 911 if having chest pain, sudden severe shortness of breath, loss of consciousness, or signs of shock (such as unable to stand due to faintness, dizziness, or lightheadedness; new onset of confusion; slow to respond or difficult to awaken; skin is pale, gray, cool, or moist to touch; severe weakness). ~ 05/10/2012 1:42:38PM Page 1  of 1 [id number]

## 2012-05-11 NOTE — Telephone Encounter (Signed)
Noted thanks °

## 2012-05-11 NOTE — Progress Notes (Signed)
Utilization Review Completed.[name] T6/09/2012   

## 2012-05-11 NOTE — Progress Notes (Addendum)
Advanced Heart Failure Rounding Note   Subjective:    [name] is a 78 y.o. gentlemen with history of CAD, ischemic cardiomyopathy associated with systolic HF, EF 35-40%.  He has a Bi-V ICD.  Also history of chronic atrial fib.    RHC 04/10/12:  RA = 11  RV = 44/5/9  PA = 51/25 (37)  PCW = 23 v = 36  Fick cardiac output/index = 4.3/2.4  PVR = 3.0 Woods  FA sat = 96%  PA sat = 61%, 59%  Admitted 6/9 with progressive fatigue and shortness of breath.  pBNP 3888.  Started on IV lasix 80 mg BID but has only received IV lasix 40 mg with minimal UOP.  Dyspneic with conversation.  +lower extremity edema.    Objective:   Weight Range:  Vital Signs:   Temp:  [97.5 F (36.4 C)-98.3 F (36.8 C)] 98.3 F (36.8 C) (06/10 0416) Pulse Rate:  [77-89] 89  (06/10 0416) Resp:  [18-20] 18  (06/10 0416) BP: (114-130)/(72-96) 130/72 mmHg (06/10 0416) SpO2:  [96 %-98 %] 97 % (06/10 0416) Weight:  [70.6 kg (155 lb 10.3 oz)] 70.6 kg (155 lb 10.3 oz) (06/10 0416)    Weight change: Filed Weights   05/11/12 0006 05/11/12 0416  Weight: 70.6 kg (155 lb 10.3 oz) 70.6 kg (155 lb 10.3 oz)    Intake/Output:   Intake/Output Summary (Last 24 hours) at 05/11/12 0916 Last data filed at 05/11/12 0901  Gross per 24 hour  Intake    480 ml  Output    700 ml  Net   -220 ml     Physical Exam: General:  Well appearing. No resp difficulty HEENT: normal Neck: supple. JVP to ear, +v wave . Carotids 2+ bilat; no bruits. No lymphadenopathy or thryomegaly appreciated. Cor: PMI nondisplaced. Irregular rate & rhythm.   3/6 MR.   Lungs: clear Abdomen: soft, nontender, nondistended. No hepatosplenomegaly. No bruits or masses. Good bowel sounds. Extremities: no cyanosis, clubbing, rash, 3+ lower extremity edema Neuro: alert & orientedx3, cranial nerves grossly intact. moves all 4 extremities w/o difficulty. Affect pleasant  Telemetry:   Labs: Basic Metabolic Panel:  Lab 05/11/12 7829 05/10/12 1723  NA 143  141  K 3.2* 3.1*  CL 102 97  CO2 29 27  GLUCOSE 115* 156*  BUN 29* 32*  CREATININE 1.11 1.23  CALCIUM 9.4 9.4  MG 2.2 --  PHOS -- --    Liver Function Tests:  Lab 05/10/12 1723  AST 55*  ALT 35  ALKPHOS 74  BILITOT 0.9  PROT 7.1  ALBUMIN 4.0   No results found for this basename: LIPASE:5,AMYLASE:5 in the last 168 hours No results found for this basename: AMMONIA:3 in the last 168 hours  CBC:  Lab 05/10/12 1723  WBC 11.0*  NEUTROABS 8.4*  HGB 12.1*  HCT 36.4*  MCV 99.5  PLT 205    Cardiac Enzymes: No results found for this basename: CKTOTAL:5,CKMB:5,CKMBINDEX:5,TROPONINI:5 in the last 168 hours  BNP: BNP (last 3 results)  Basename 05/10/12 1830  PROBNP 3888.0*      Imaging: Dg Chest 2 View  05/10/2012  *RADIOLOGY REPORT*  Clinical Data: Weakness with fatigue for 1 week.  CHEST - 2 VIEW  Comparison: 04/08/2011 and 04/07/2011 radiographs.  Findings: Left subclavian pacemaker leads appear unchanged within the right atrium, right ventricle and coronary sinus.  There is stable cardiomegaly.  Overall pulmonary aeration has improved. There is a hiatal hernia extending over the left hemidiaphragm which appears  unchanged.  No acute osseous findings are evident.  IMPRESSION: Improved basilar aeration compared with most recent examination with stable cardiomegaly.  No acute cardiopulmonary process demonstrated.  Original Report Authenticated By: Gerrianne Scale, M.D.      Medications:     Scheduled Medications:    . B-complex with vitamin C  1 tablet Oral q morning - 10a  . beta carotene w/minerals  1 tablet Oral Daily  . doxazosin  4 mg Oral QHS  . finasteride  5 mg Oral Daily  . furosemide  40 mg Intravenous Once  . furosemide  80 mg Intravenous BID  . lisinopril  5 mg Oral Daily  . loratadine  10 mg Oral Daily  . mirtazapine  15 mg Oral QHS  . pantoprazole  40 mg Oral Q1200  . potassium chloride  20 mEq Oral BID  . potassium chloride  40 mEq Oral Once    . sertraline  100 mg Oral Daily  . simvastatin  20 mg Oral q1800  . sodium chloride  3 mL Intravenous Q12H  . Tamsulosin HCl  0.8 mg Oral Daily  . warfarin  2.5 mg Oral Custom  . warfarin  5 mg Oral Custom  . Warfarin - Physician Dosing Inpatient   Does not apply q1800  . DISCONTD: Melatonin  3 mg Oral QHS  . DISCONTD: warfarin  2.5-5 mg Oral Daily     Infusions:     PRN Medications:  sodium chloride, acetaminophen, clonazePAM, ondansetron (ZOFRAN) IV, sodium chloride   Assessment:   1.  Acute on Chronic Systolic Heart Failure 2. Ischemic Cardiomyopathy, EF 35-40% 3. Moderate to severe MR, valve area 1.85 cm^2 4. Hypokalemia 5. Fatigue 6. Chronic AF 7. CAD  Plan/Discussion:    [name] presents with volume overload on exam with progressive dyspnea and fatigue.  Will continue with IV lasix 80 mg BID and give a dose of metolazone 2.5 mg this morning as his UOP has been minimal with I Vlasix 40 mg x1.  Will supp K.  If urine output continues to be sluggish will proceed with RHC to further assess hemodynamics.    Upon discharge may be beneficial to transition him to torsemide as his daughter states lasix 80 mg does not hold his weight.    Length of Stay: 1  [name], [facility] 05/11/2012, 9:16 AM   Patient seen and examined with [name], PA-C. We discussed all aspects of the encounter. I agree with the assessment and plan as stated above.   Difficult situation. Overall I think he is nearing end-stage HF with considerable co-morbidities as well. He is markedly volume overloaded (this is the worst he has been in quite some time.) As he has had poor response to lasix 40 IV agree with going to 80 IV bid and can add metolazone as needed. If not diuresing would add milrinone empirically to see if it helps. We will have to reduce his dry weight. I expressed my overall concerns to him and his family.  Continue coumadin for AF. In the event that he does need RHC can do on  coumadin as long as INR < 3.   [name],MD 10:48 AM

## 2012-05-12 LAB — BASIC METABOLIC PANEL
BUN: 30 mg/dL — ABNORMAL HIGH (ref 6–23)
Calcium: 9.1 mg/dL (ref 8.4–10.5)
Creatinine, Ser: 1.15 mg/dL (ref 0.50–1.35)
GFR calc Af Amer: 65 mL/min — ABNORMAL LOW (ref >90)
GFR calc non Af Amer: 56 mL/min — ABNORMAL LOW (ref >90)

## 2012-05-12 LAB — CBC
MCH: 32.3 pg (ref 26.0–34.0)
MCV: 99.1 fL (ref 78.0–100.0)
Platelets: 176 10*3/uL (ref 150–400)
RDW: 14 % (ref 11.5–15.5)

## 2012-05-12 MED ORDER — POTASSIUM CHLORIDE CRYS ER 20 MEQ PO TBCR
40.0000 meq | EXTENDED_RELEASE_TABLET | Freq: Three times a day (TID) | ORAL | Status: DC
  Administered 2012-05-12 (×2): 40 meq via ORAL
  Filled 2012-05-12 (×4): qty 2

## 2012-05-12 MED ORDER — MILRINONE IN DEXTROSE 200-5 MCG/ML-% IV SOLN
0.1250 ug/kg/min | INTRAVENOUS | Status: DC
  Administered 2012-05-12 – 2012-05-15 (×4): 0.25 ug/kg/min via INTRAVENOUS
  Filled 2012-05-12 (×7): qty 100

## 2012-05-12 NOTE — Progress Notes (Addendum)
Advanced Heart Failure Rounding Note   Subjective:    [name] is a 78 y.o. gentlemen with history of CAD, ischemic cardiomyopathy associated with systolic HF, EF 35-40%.  He has a Bi-V ICD.  Also history of chronic atrial fib.    RHC 04/10/12:  RA = 11  RV = 44/5/9  PA = 51/25 (37)  PCW = 23 v = 36  Fick cardiac output/index = 4.3/2.4  PVR = 3.0 Woods  FA sat = 96%  PA sat = 61%, 59%  Admitted 6/9 with progressive fatigue and shortness of breath.  pBNP 3888.    Started on IV lasix 80 mg BID with metolazone 2.5 mg x1.  UOP -2775.  Weight down 5 pounds since admit.    Feels weak. No CP. No dyspnea at rest.   Objective:   Weight Range:  Vital Signs:   Temp:  [97 F (36.1 C)-98.4 F (36.9 C)] 97 F (36.1 C) (06/11 0500) Pulse Rate:  [73-79] 79  (06/11 0958) Resp:  [18-22] 20  (06/11 0500) BP: (90-105)/(58-65) 90/60 mmHg (06/11 0958) SpO2:  [93 %-96 %] 94 % (06/11 0500) Weight:  [68.448 kg (150 lb 14.4 oz)] 68.448 kg (150 lb 14.4 oz) (06/11 0500)    Weight change: Filed Weights   05/11/12 0006 05/11/12 0416 05/12/12 0500  Weight: 70.6 kg (155 lb 10.3 oz) 70.6 kg (155 lb 10.3 oz) 68.448 kg (150 lb 14.4 oz)    Intake/Output:   Intake/Output Summary (Last 24 hours) at 05/12/12 0959 Last data filed at 05/12/12 0900  Gross per 24 hour  Intake    840 ml  Output   3950 ml  Net  -3110 ml     Physical Exam: General:  Well appearing. No resp difficulty HEENT: normal Neck: supple. JVP to 10-11, +v wave . Carotids 2+ bilat; no bruits. No lymphadenopathy or thryomegaly appreciated. Cor: PMI nondisplaced. Irregular rate & rhythm.   3/6 MR.   Lungs: clear Abdomen: soft, nontender, nondistended. No hepatosplenomegaly. No bruits or masses. Good bowel sounds. Extremities: no cyanosis, clubbing, rash, 2-3+ lower extremity edema Neuro: alert & orientedx3, cranial nerves grossly intact. moves all 4 extremities w/o difficulty. Affect pleasant  Telemetry: Afib  70s  Labs: Basic Metabolic Panel:  Lab 05/12/12 3244 05/11/12 0642 05/10/12 1723  NA 143 143 141  K 3.2* 3.2* 3.1*  CL 101 102 97  CO2 30 29 27   GLUCOSE 111* 115* 156*  BUN 30* 29* 32*  CREATININE 1.15 1.11 1.23  CALCIUM 9.1 9.4 9.4  MG -- 2.2 --  PHOS -- -- --    Liver Function Tests:  Lab 05/10/12 1723  AST 55*  ALT 35  ALKPHOS 74  BILITOT 0.9  PROT 7.1  ALBUMIN 4.0   No results found for this basename: LIPASE:5,AMYLASE:5 in the last 168 hours No results found for this basename: AMMONIA:3 in the last 168 hours  CBC:  Lab 05/12/12 0500 05/10/12 1723  WBC 7.6 11.0*  NEUTROABS -- 8.4*  HGB 11.3* 12.1*  HCT 34.7* 36.4*  MCV 99.1 99.5  PLT 176 205    Cardiac Enzymes: No results found for this basename: CKTOTAL:5,CKMB:5,CKMBINDEX:5,TROPONINI:5 in the last 168 hours  BNP: BNP (last 3 results)  Basename 05/10/12 1830  PROBNP 3888.0*      Imaging: Dg Chest 2 View  05/10/2012  *RADIOLOGY REPORT*  Clinical Data: Weakness with fatigue for 1 week.  CHEST - 2 VIEW  Comparison: 04/08/2011 and 04/07/2011 radiographs.  Findings: Left subclavian pacemaker  leads appear unchanged within the right atrium, right ventricle and coronary sinus.  There is stable cardiomegaly.  Overall pulmonary aeration has improved. There is a hiatal hernia extending over the left hemidiaphragm which appears unchanged.  No acute osseous findings are evident.  IMPRESSION: Improved basilar aeration compared with most recent examination with stable cardiomegaly.  No acute cardiopulmonary process demonstrated.  Original Report Authenticated By: Gerrianne Scale, M.D.     Medications:     Scheduled Medications:    . B-complex with vitamin C  1 tablet Oral q morning - 10a  . beta carotene w/minerals  1 tablet Oral Daily  . doxazosin  4 mg Oral QHS  . finasteride  5 mg Oral Daily  . furosemide  80 mg Intravenous BID  . lisinopril  5 mg Oral Daily  . loratadine  10 mg Oral Daily  .  metolazone  2.5 mg Oral NOW  . mirtazapine  15 mg Oral QHS  . pantoprazole  40 mg Oral Q1200  . potassium chloride  40 mEq Oral BID  . sertraline  100 mg Oral Daily  . simvastatin  20 mg Oral q1800  . sodium chloride  3 mL Intravenous Q12H  . Tamsulosin HCl  0.8 mg Oral Daily  . warfarin  2.5 mg Oral Custom  . warfarin  5 mg Oral Custom  . Warfarin - Physician Dosing Inpatient   Does not apply q1800  . DISCONTD: potassium chloride  20 mEq Oral BID    Infusions:    PRN Medications: sodium chloride, acetaminophen, clonazePAM, ondansetron (ZOFRAN) IV, sodium chloride   Assessment:   1.  Acute on Chronic Systolic Heart Failure 2. Ischemic Cardiomyopathy, EF 35-40% 3. Moderate to severe MR, valve area 1.85 cm^2 4. Hypokalemia 5. Fatigue 6. Chronic AF 7. CAD  Plan/Discussion:    [name] presents with massive volume overload on exam that is responding to IV lasix and one dose of metolazone.  Continue IV lasix.  No need for milrinone at this time.  Also hold off on RHC.  Renal function remains stable, will continue to follow.  Will replace K+.  Will have PT evaluate.  Continue coumadin for chronic AF.  Discussed low sodium diet and fluid restrictions with patient and daughter.   Length of Stay: 2  [name], [facility] 05/12/2012, 9:59 AM  Patient seen and examined with [name], PA-C. We discussed all aspects of the encounter. I agree with the assessment and plan as stated above.   He is diuresing well. Still with significant volume on board however BP soft. Will continue IV diuresis. Hold lisinopril.  Long talk with family about his rapid decline over past few months. Given multifactorial nature and results of cath last month I do not think we can reverse this with IV inotropes. We talked about possible Hospice care and they are amenable to this but would like to keep him at home if at all possible. We will discuss end of life issues with him some more over the next day  or two.   PT/OT to see today.  Valeska Haislip,MD 10:46 AM

## 2012-05-13 DIAGNOSIS — I509 Heart failure, unspecified: Secondary | ICD-10-CM

## 2012-05-13 DIAGNOSIS — L089 Local infection of the skin and subcutaneous tissue, unspecified: Secondary | ICD-10-CM

## 2012-05-13 DIAGNOSIS — I5032 Chronic diastolic (congestive) heart failure: Secondary | ICD-10-CM

## 2012-05-13 DIAGNOSIS — I2510 Atherosclerotic heart disease of native coronary artery without angina pectoris: Secondary | ICD-10-CM

## 2012-05-13 LAB — BASIC METABOLIC PANEL
CO2: 31 mEq/L (ref 19–32)
Calcium: 9 mg/dL (ref 8.4–10.5)
Creatinine, Ser: 1.16 mg/dL (ref 0.50–1.35)
GFR calc Af Amer: 64 mL/min — ABNORMAL LOW (ref >90)

## 2012-05-13 LAB — URINE CULTURE
Colony Count: >=100000
Culture  Setup Time: 201306100218

## 2012-05-13 LAB — PROTIME-INR: Prothrombin Time: 33.8 seconds — ABNORMAL HIGH (ref 11.6–15.2)

## 2012-05-13 MED ORDER — METOLAZONE 2.5 MG PO TABS
2.5000 mg | ORAL_TABLET | Freq: Every day | ORAL | Status: DC
  Administered 2012-05-13 – 2012-05-14 (×2): 2.5 mg via ORAL
  Filled 2012-05-13 (×3): qty 1

## 2012-05-13 MED ORDER — POTASSIUM CHLORIDE CRYS ER 20 MEQ PO TBCR
40.0000 meq | EXTENDED_RELEASE_TABLET | Freq: Two times a day (BID) | ORAL | Status: DC
  Administered 2012-05-13 (×2): 40 meq via ORAL
  Filled 2012-05-13 (×2): qty 2

## 2012-05-13 MED ORDER — WARFARIN - PHARMACIST DOSING INPATIENT
Freq: Every day | Status: DC
  Administered 2012-05-16 – 2012-05-17 (×2)

## 2012-05-13 NOTE — Evaluation (Signed)
Occupational Therapy Evaluation Patient Details Name: [name] MRN: [id number] DOB: [birth year] Today's Date: 05/13/2012 Time: 1046-1100 OT Time Calculation (min): 14 min  OT Assessment / Plan / Recommendation Clinical Impression  Pt. presents with CHF and with fatigue and SOB. Pt. will benefit from skilled OT to increase functional independence with ADLs by improving activity tolerance and endurance to mod I level for D/C home.    OT Assessment  Patient needs continued OT Services    Follow Up Recommendations  No OT follow up;Supervision - Intermittent    Barriers to Discharge None    Equipment Recommendations  None recommended by OT       Frequency  Min 2X/week    Precautions / Restrictions Precautions Precautions: Fall       ADL  Eating/Feeding: Performed;Independent Where Assessed - Eating/Feeding: Chair Grooming: Performed;Wash/dry hands;Wash/dry face;Set up;Min guard Where Assessed - Grooming: Unsupported standing Upper Body Bathing: Simulated;Set up Where Assessed - Upper Body Bathing: Unsupported sitting Lower Body Bathing: Simulated;Minimal assistance Where Assessed - Lower Body Bathing: Unsupported sit to stand Upper Body Dressing: Performed;Set up (don gown) Where Assessed - Upper Body Dressing: Supported sitting Lower Body Dressing: Simulated;Minimal assistance Where Assessed - Lower Body Dressing: Unsupported sit to stand Toilet Transfer: Performed;Minimal assistance Toilet Transfer Method: Sit to stand Toilet Transfer Equipment: Other (comment) Nurse, children's) Toileting - Clothing Manipulation and Hygiene: Simulated;Minimal assistance (for thoroughness) Where Assessed - Toileting Clothing Manipulation and Hygiene: Sit to stand from 3-in-1 or toilet Tub/Shower Transfer Method: Not assessed Equipment Used:  (rollator) Transfers/Ambulation Related to ADLs: Close supervision  ADL Comments: Pt. educated on safety and techniques for increased activity  tolerance with energy conservation techniques    OT Diagnosis: Generalized weakness  OT Problem List: Decreased strength;Decreased activity tolerance;Cardiopulmonary status limiting activity OT Treatment Interventions: Self-care/ADL training;Energy conservation   OT Goals Acute Rehab OT Goals OT Goal Formulation: With patient Time For Goal Achievement: 05/20/12 Potential to Achieve Goals: Good ADL Goals Pt Will Perform Lower Body Dressing: Sit to stand from chair;with adaptive equipment;with modified independence ADL Goal: Lower Body Dressing - Progress: Goal set today Pt Will Transfer to Toilet: with modified independence;Ambulation;with DME;3-in-1 ADL Goal: Toilet Transfer - Progress: Goal set today Additional ADL Goal #1: Pt. will recall 3 energy conservation techniques to use with an ADL task. ADL Goal: Additional Goal #1 - Progress: Goal set today  Visit Information  Last OT Received On: 05/13/12 Assistance Needed: +1    Subjective Data  Subjective: "I am doing pretty good"   Prior Functioning  Home Living Lives With: Spouse Available Help at Discharge: Family Type of Home: Independent living facility Home Access: Level entry Home Layout: One level Bathroom Shower/Tub: Walk-in Contractor: Standard Bathroom Accessibility: Yes How Accessible: Accessible via walker Home Adaptive Equipment: Grab bars in shower;Grab bars around toilet;Hospital bed;Shower chair with back Prior Function Level of Independence: Independent with assistive device(s) Able to Take Stairs?: No Driving: No Vocation: Retired Musician: HOH Dominant Hand: Right    Cognition  Overall Cognitive Status: Appears within functional limits for tasks assessed/performed Arousal/Alertness: Awake/alert Orientation Level: Appears intact for tasks assessed;Oriented X4 / Intact Behavior During Session: [facility] for tasks performed    Extremity/Trunk Assessment Right Upper  Extremity Assessment RUE ROM/Strength/Tone: Within functional levels RUE Sensation: WFL - Light Touch RUE Coordination: WFL - gross/fine motor Left Upper Extremity Assessment LUE ROM/Strength/Tone: Within functional levels;WFL for tasks assessed LUE Sensation: WFL - Light Touch LUE Coordination: WFL - gross/fine motor   Mobility  Bed Mobility Bed Mobility: Not assessed Transfers Transfers: Sit to Stand;Stand to Sit Sit to Stand: 4: Min guard;From chair/3-in-1;With upper extremity assist (due to low surface of chair) Stand to Sit: 5: Supervision;With upper extremity assist Details for Transfer Assistance: cues for hand placement first trials only.          End of Session OT - End of Session Equipment Utilized During Treatment: Gait belt Activity Tolerance: Patient tolerated treatment well Patient left: in chair;with call bell/phone within reach Nurse Communication: Mobility status   Cassandria Anger, OTR/L Pager [phone] 05/13/2012, 2:40 PM

## 2012-05-13 NOTE — Progress Notes (Signed)
ANTICOAGULATION CONSULT NOTE - Initial Consult  Pharmacy Consult for Coumadin Indication: atrial fibrillation  Allergies  Allergen Reactions  . Penicillins Other (See Comments)    Unknown reaction many years ago    Patient Measurements: Height: 5\' 8"  (172.7 cm) Weight: 150 lb 3.2 oz (68.13 kg) (scale A) IBW/kg (Calculated) : 68.4   Vital Signs: Temp: 97.6 F (36.4 C) (06/12 0500) Temp src: Oral (06/12 0500) BP: 115/74 mmHg (06/12 0500) Pulse Rate: 82  (06/12 0500)  Labs:  Basename 05/13/12 0500 05/12/12 0500 05/11/12 0642 05/10/12 1723  HGB -- 11.3* -- 12.1*  HCT -- 34.7* -- 36.4*  PLT -- 176 -- 205  APTT -- -- -- --  LABPROT 33.8* 31.0* 28.8* --  INR 3.27* 2.93* 2.66* --  HEPARINUNFRC -- -- -- --  CREATININE 1.16 1.15 1.11 --  CKTOTAL -- -- -- --  CKMB -- -- -- --  TROPONINI -- -- -- --    Estimated Creatinine Clearance: 44.8 ml/min (by C-G formula based on Cr of 1.16).   Medical History: Past Medical History  Diagnosis Date  . Systolic CHF, chronic   . Ischemic cardiomyopathy     a.  echo 8/08: EF 20-30%;  b. echo 5/11: EF 45%, mod to severe MR  . Permanent atrial fibrillation     Amiodarone stopped due to recurrent AFib  . CAD (coronary artery disease)     a. cath 9/06: occluded LAD with L-L and R-L collaterals; LM 20%, CFX 20%, OM1 30%, RCA 20-30%, EF 45%  . Mitral regurgitation     a. mod to severe by echo 5/11  . Anemia     2/2 GI bleed  . Symptomatic bradycardia     a. s/p BiV-ICD;  b. s/p change to BiV pacer only 12/11  . HTN (hypertension)   . HLD (hyperlipidemia)   . Osteoporosis   . BPH (benign prostatic hypertrophy)   . Glaucoma   . Urinary retention   . Depression   . Cataracts, bilateral     Retinal bleeding after cataract removal  . Glaucoma   . Hiatal hernia     Medications:  Prescriptions prior to admission  Medication Sig Dispense Refill  . B Complex-C (SUPER B COMPLEX/VITAMIN C PO) Take 1 tablet by mouth daily.      .  clonazePAM (KLONOPIN) 0.5 MG tablet Take 0.25 mg by mouth 2 (two) times daily as needed. anxiety      . doxazosin (CARDURA) 4 MG tablet Take 4 mg by mouth at bedtime.      . ergocalciferol (VITAMIN D2) 50000 UNITS capsule Take 50,000 Units by mouth once a week. On Fridays      . fexofenadine (ALLEGRA) 180 MG tablet Take 1 tablet (180 mg total) by mouth daily.  30 tablet  5  . finasteride (PROSCAR) 5 MG tablet Take 5 mg by mouth daily.      . furosemide (LASIX) 40 MG tablet Take 40-80 mg by mouth daily.       . Melatonin 3 MG CAPS Take 3 mg by mouth at bedtime.       . metolazone (ZAROXOLYN) 2.5 MG tablet Take 2.5 mg by mouth daily as needed. Additional fluid      . mirtazapine (REMERON) 15 MG tablet Take 1 tablet (15 mg total) by mouth at bedtime.  30 tablet  3  . Multiple Vitamins-Minerals (OCUVITE PO) Take 1 tablet by mouth daily.       Marland Kitchen omeprazole (PRILOSEC) 20 MG  capsule Take 20 mg by mouth daily.      . potassium chloride SA (K-DUR,KLOR-CON) 20 MEQ tablet Take 20 mEq by mouth daily.      . pravastatin (PRAVACHOL) 40 MG tablet Take 40 mg by mouth daily.      . sertraline (ZOLOFT) 100 MG tablet Take 1 tablet (100 mg total) by mouth daily.  30 tablet  6  . Tamsulosin HCl (FLOMAX) 0.4 MG CAPS Take 0.8 mg by mouth daily.       Marland Kitchen warfarin (COUMADIN) 5 MG tablet Take 2.5-5 mg by mouth daily. 1/2 tablet (2.5 mg) on Wednesday and Sunday, 1 tablet (5 mg) on remainder of days        Assessment: 78 y.o. male known to pharmacy from HF rounds. Currently on home dose of coumadin - 5mg  daily except for 2.5mg  on Wed and Sun. INR continues upward trend and is supratherapeutic today. No bleeding noted. Noted ongoing discussions about hospice care.  Goal of Therapy:  INR 2-3 Monitor platelets by anticoagulation protocol: Yes   Plan:  1. Continue daily PT/INR 2. Will hold coumadin today  Christoper Fabian, PharmD, BCPS Clinical pharmacist, pager [phone] 05/13/2012,8:45 AM

## 2012-05-13 NOTE — Progress Notes (Signed)
Advanced Heart Failure Rounding Note   Subjective:    [name] is a 78 y.o. gentlemen with history of CAD, ischemic cardiomyopathy associated with systolic HF, EF 35-40%.  He has a Bi-V ICD.  Also history of chronic atrial fib.    RHC 04/10/12:  RA = 11  RV = 44/5/9  PA = 51/25 (37)  PCW = 23 v = 36  Fick cardiac output/index = 4.3/2.4  PVR = 3.0 Woods  FA sat = 96%  PA sat = 61%, 59%  Admitted 6/9 with progressive fatigue and shortness of breath.  pBNP 3888.    IV lasix not given yesterday afternoon because he lost IV access.  UOP poor.  IV milrinone started late 6/11.    Feels weak. No CP. No dyspnea at rest.    Objective:   Weight Range:  Vital Signs:   Temp:  [97.6 F (36.4 C)-97.9 F (36.6 C)] 97.6 F (36.4 C) (06/12 0500) Pulse Rate:  [75-82] 82  (06/12 0500) Resp:  [16-20] 16  (06/12 0500) BP: (98-115)/(65-74) 115/74 mmHg (06/12 0500) SpO2:  [95 %-97 %] 97 % (06/12 0500) Weight:  [68.13 kg (150 lb 3.2 oz)] 68.13 kg (150 lb 3.2 oz) (06/12 0500) Last BM Date: 05/13/12  Weight change: Filed Weights   05/11/12 0416 05/12/12 0500 05/13/12 0500  Weight: 70.6 kg (155 lb 10.3 oz) 68.448 kg (150 lb 14.4 oz) 68.13 kg (150 lb 3.2 oz)    Intake/Output:   Intake/Output Summary (Last 24 hours) at 05/13/12 1014 Last data filed at 05/13/12 0841  Gross per 24 hour  Intake 1066.52 ml  Output   1875 ml  Net -808.48 ml     Physical Exam: General:  Well appearing. No resp difficulty HEENT: normal Neck: supple. JVP to 10-11, +v wave . Carotids 2+ bilat; no bruits. No lymphadenopathy or thryomegaly appreciated. Cor: PMI nondisplaced. Irregular rate & rhythm.  3/6 MR.   Lungs: clear Abdomen: soft, nontender, nondistended. No hepatosplenomegaly. No bruits or masses. Good bowel sounds. Extremities: no cyanosis, clubbing, rash, 1-3+ lower extremity edema  (R>L) Neuro: alert & orientedx3, cranial nerves grossly intact. moves all 4 extremities w/o difficulty. Affect  pleasant  Telemetry: Afib 70s  Labs: Basic Metabolic Panel:  Lab 05/13/12 1610 05/12/12 0500 05/11/12 0642 05/10/12 1723  NA 142 143 143 141  K 4.1 3.2* 3.2* 3.1*  CL 102 101 102 97  CO2 31 30 29 27   GLUCOSE 102* 111* 115* 156*  BUN 27* 30* 29* 32*  CREATININE 1.16 1.15 1.11 1.23  CALCIUM 9.0 9.1 9.4 --  MG -- -- 2.2 --  PHOS -- -- -- --    Liver Function Tests:  Lab 05/10/12 1723  AST 55*  ALT 35  ALKPHOS 74  BILITOT 0.9  PROT 7.1  ALBUMIN 4.0   No results found for this basename: LIPASE:5,AMYLASE:5 in the last 168 hours No results found for this basename: AMMONIA:3 in the last 168 hours  CBC:  Lab 05/12/12 0500 05/10/12 1723  WBC 7.6 11.0*  NEUTROABS -- 8.4*  HGB 11.3* 12.1*  HCT 34.7* 36.4*  MCV 99.1 99.5  PLT 176 205    Cardiac Enzymes: No results found for this basename: CKTOTAL:5,CKMB:5,CKMBINDEX:5,TROPONINI:5 in the last 168 hours  BNP: BNP (last 3 results)  Basename 05/10/12 1830  PROBNP 3888.0*      Imaging: No results found.   Medications:     Scheduled Medications:    . B-complex with vitamin C  1 tablet Oral  q morning - 10a  . beta carotene w/minerals  1 tablet Oral Daily  . doxazosin  4 mg Oral QHS  . finasteride  5 mg Oral Daily  . furosemide  80 mg Intravenous BID  . lisinopril  5 mg Oral Daily  . loratadine  10 mg Oral Daily  . mirtazapine  15 mg Oral QHS  . pantoprazole  40 mg Oral Q1200  . potassium chloride  40 mEq Oral BID  . sertraline  100 mg Oral Daily  . simvastatin  20 mg Oral q1800  . sodium chloride  3 mL Intravenous Q12H  . Tamsulosin HCl  0.8 mg Oral Daily  . Warfarin - Pharmacist Dosing Inpatient   Does not apply q1800  . DISCONTD: potassium chloride  40 mEq Oral BID  . DISCONTD: potassium chloride  40 mEq Oral TID  . DISCONTD: warfarin  2.5 mg Oral Custom  . DISCONTD: warfarin  5 mg Oral Custom  . DISCONTD: Warfarin - Physician Dosing Inpatient   Does not apply q1800    Infusions:    . milrinone  0.25 mcg/kg/min (05/13/12 0841)    PRN Medications: sodium chloride, acetaminophen, clonazePAM, ondansetron (ZOFRAN) IV, sodium chloride   Assessment:   1.  Acute on Chronic Systolic Heart Failure 2. Ischemic Cardiomyopathy, EF 35-40% 3. Moderate to severe MR, valve area 1.85 cm^2 4. Hypokalemia 5. Fatigue 6. Chronic AF 7. CAD  Plan/Discussion:    [name] presents with massive volume overload on exam, with sluggish urine response yesterday.  Continue milrinone 0.25 mcg/kg/min and IV lasix, will give 1 dose of metolazone 2.5 mg this am.  Continue to watch renal function.  Place Ted hose.    Continue coumadin for chronic AF.     Length of Stay: 3  [name], [facility] 05/13/2012, 10:14 AM   Patient seen and examined with [name], PA-C. We discussed all aspects of the encounter. I agree with the assessment and plan as stated above.   Diuresis has picked up significantly with milrinone support. Renal function stable.  Will continue inotropes for now. Possibility of Hospice discussed with patient and family.  We discussed Code status with him and his family and he will be DNR/DNI/no shocks. Inotropes ok. Order on chart.  Andre Ravelo,MD 5:40 PM

## 2012-05-13 NOTE — Progress Notes (Signed)
Pt evaluated for long term disease management services with [facility] Care Management program as a benefit of [employer]. RN case manager will do a post d/c transition of care call and monthly home visits for education and assessments of CHF, ICM, Atrial Fib and other needs. Program discussed with the pt and his daughter at the bedside. Brooke Bonito C. Roena Malady, RN, MS, CCM, [facility], # [phone]

## 2012-05-13 NOTE — Evaluation (Signed)
Physical Therapy Evaluation and Discharge note.  Patient Details Name: [name] MRN: [id number] DOB: May 11, 1924 Today's Date: 05/13/2012 Time: 0920-0951 PT Time Calculation (min): 31 min  PT Assessment / Plan / Recommendation Clinical Impression  Pt is an 78 y/o male from independent living facility.  Pt presents with no acute PT  needs at this time.     PT Assessment  Patent does not need any further PT services    Follow Up Recommendations  No PT follow up    Barriers to Discharge        lEquipment Recommendations  None recommended by PT    Recommendations for Other Services     Frequency      Precautions / Restrictions Precautions Precautions: Fall Restrictions Weight Bearing Restrictions: No   Pertinent Vitals/Pain Pt denied  Pain.        Mobility  Bed Mobility Bed Mobility: Not assessed Transfers Transfers: Sit to Stand;Stand to Sit Sit to Stand: 6: Modified independent (Device/Increase time);From chair/3-in-1;With armrests;With upper extremity assist Stand to Sit: 6: Modified independent (Device/Increase time);5: Supervision;To chair/3-in-1 (3 trials.) Details for Transfer Assistance: cues for hand placement first trials only.  Ambulation/Gait Ambulation/Gait Assistance: 6: Modified independent (Device/Increase time) Ambulation Distance (Feet): 150 Feet Assistive device: 4-wheeled walker Ambulation/Gait Assistance Details: Cues for trunk posture.  Unable to correct due to spinal abnormality.   Gait Pattern: Trunk flexed Stairs: No Wheelchair Mobility Wheelchair Mobility: No Modified Rankin (Stroke Patients Only) Pre-Morbid Rankin Score: No significant disability    Exercises Total Joint Exercises Ankle Circles/Pumps: Both;10 reps;Seated   PT Diagnosis:    PT Problem List:   PT Treatment Interventions:     PT Goals    Visit Information  Last PT Received On: 05/13/12 Assistance Needed: +1    Subjective Data  Subjective: agree to PT  eval Patient Stated Goal: Return to Independent living with spouse   Prior Functioning  Home Living Lives With: Spouse Available Help at Discharge: Family Type of Home: Independent living facility Home Access: Level entry Home Layout: One level Bathroom Shower/Tub: Walk-in Contractor: Standard Bathroom Accessibility: Yes How Accessible: Accessible via walker Home Adaptive Equipment: Grab bars in shower;Grab bars around toilet;Hospital bed;Shower chair with back Prior Function Level of Independence: Independent with assistive device(s) Able to Take Stairs?: No Driving: No Vocation: Retired Musician: HOH Dominant Hand: Right    Cognition  Overall Cognitive Status: Appears within functional limits for tasks assessed/performed Arousal/Alertness: Awake/alert Orientation Level: Appears intact for tasks assessed;Oriented X4 / Intact Behavior During Session: [facility] for tasks performed    Extremity/Trunk Assessment Left Upper Extremity Assessment LUE ROM/Strength/Tone: Within functional levels;WFL for tasks assessed Right Lower Extremity Assessment RLE ROM/Strength/Tone: Within functional levels;WFL for tasks assessed Trunk Assessment Trunk Assessment: Kyphotic;Other exceptions (Scoliosis)   Balance Balance Balance Assessed: No  End of Session PT - End of Session Equipment Utilized During Treatment: Gait belt Activity Tolerance: Patient tolerated treatment well Patient left: in chair;with family/visitor present;with call bell/phone within reach Nurse Communication: Mobility status   Phillipe Clemon 05/13/2012, 12:31 PM Javanna Patin L. Hazelene Doten DPT [phone]

## 2012-05-14 DIAGNOSIS — I509 Heart failure, unspecified: Secondary | ICD-10-CM

## 2012-05-14 LAB — URINALYSIS, ROUTINE W REFLEX MICROSCOPIC
Bilirubin Urine: NEGATIVE
Glucose, UA: NEGATIVE mg/dL
Ketones, ur: NEGATIVE mg/dL
pH: 8.5 — ABNORMAL HIGH (ref 5.0–8.0)

## 2012-05-14 LAB — BASIC METABOLIC PANEL
BUN: 25 mg/dL — ABNORMAL HIGH (ref 6–23)
CO2: 32 mEq/L (ref 19–32)
Chloride: 100 mEq/L (ref 96–112)
Creatinine, Ser: 1.23 mg/dL (ref 0.50–1.35)
GFR calc Af Amer: 60 mL/min — ABNORMAL LOW (ref >90)

## 2012-05-14 LAB — PROTIME-INR: Prothrombin Time: 29.7 seconds — ABNORMAL HIGH (ref 11.6–15.2)

## 2012-05-14 MED ORDER — FUROSEMIDE 10 MG/ML IJ SOLN: 40.0000 mg | Freq: Once | INTRAMUSCULAR | Status: DC

## 2012-05-14 MED ORDER — WARFARIN SODIUM 2.5 MG PO TABS
2.5000 mg | ORAL_TABLET | Freq: Once | ORAL | Status: AC
  Administered 2012-05-14: 2.5 mg via ORAL
  Filled 2012-05-14: qty 1

## 2012-05-14 MED ORDER — FUROSEMIDE 10 MG/ML IJ SOLN
40.0000 mg | Freq: Two times a day (BID) | INTRAMUSCULAR | Status: DC
  Administered 2012-05-14: 40 mg via INTRAVENOUS
  Filled 2012-05-14 (×3): qty 4

## 2012-05-14 MED ORDER — POTASSIUM CHLORIDE CRYS ER 20 MEQ PO TBCR
20.0000 meq | EXTENDED_RELEASE_TABLET | Freq: Two times a day (BID) | ORAL | Status: DC
  Administered 2012-05-14 – 2012-05-18 (×9): 20 meq via ORAL
  Filled 2012-05-14 (×8): qty 1

## 2012-05-14 NOTE — Progress Notes (Addendum)
Advanced Heart Failure Rounding Note   Subjective:    Mr. Burby is a 78 y.o. gentlemen with history of CAD, ischemic cardiomyopathy associated with systolic HF, EF 35-40%.  He has a Bi-V ICD.  Also history of chronic atrial fib.    RHC 04/10/12:  RA = 11  RV = 44/5/9  PA = 51/25 (37)  PCW = 23 v = 36  Fick cardiac output/index = 4.3/2.4  PVR = 3.0 Woods  FA sat = 96%  PA sat = 61%, 59%  Admitted 6/9 with progressive fatigue and shortness of breath.  pBNP 3888.  IV milrinone started late 6/11.    UOP -2.8 L.  Weight down 9 pounds since admit.  Cr stable.  DNR/DNI/no shocks discussed yesterday.   Feels pretty good.  Ambulating to the bathroom without trouble.  Dyspnea improving.  No pain with urination.    Objective:   Weight Range: baseline has changed.   Vital Signs:   Temp:  [97.5 F (36.4 C)-98.5 F (36.9 C)] 97.5 F (36.4 C) (06/13 0558) Pulse Rate:  [74-80] 74  (06/13 0558) Resp:  [18-20] 18  (06/13 0558) BP: (93-120)/(54-61) 120/54 mmHg (06/13 0558) SpO2:  [90 %-94 %] 94 % (06/13 0558) Weight:  [66.588 kg (146 lb 12.8 oz)] 66.588 kg (146 lb 12.8 oz) (06/13 0558) Last BM Date: 05/13/12  Weight change: Filed Weights   05/12/12 0500 05/13/12 0500 05/14/12 0558  Weight: 68.448 kg (150 lb 14.4 oz) 68.13 kg (150 lb 3.2 oz) 66.588 kg (146 lb 12.8 oz)    Intake/Output:   Intake/Output Summary (Last 24 hours) at 05/14/12 0903 Last data filed at 05/14/12 0856  Gross per 24 hour  Intake 713.83 ml  Output   3079 ml  Net -2365.17 ml     Physical Exam: General:  Well appearing. No resp difficulty HEENT: normal Neck: supple. JVP to 8-9, +v wave . Carotids 2+ bilat; no bruits. No lymphadenopathy or thryomegaly appreciated. Cor: PMI nondisplaced. Irregular rate & rhythm.  3/6 MR.   Lungs: clear Abdomen: soft, nontender, nondistended. No hepatosplenomegaly. No bruits or masses. Good bowel sounds. Extremities: no cyanosis, clubbing, rash, 1-3+ lower extremity edema   (R>L) Neuro: alert & orientedx3, cranial nerves grossly intact. moves all 4 extremities w/o difficulty. Affect pleasant  Telemetry: Afib 70s  Labs: Basic Metabolic Panel:  Lab 05/14/12 1610 05/13/12 0500 05/12/12 0500 05/11/12 0642 05/10/12 1723  NA 140 142 143 143 141  K 4.4 4.1 3.2* 3.2* 3.1*  CL 100 102 101 102 97  CO2 32 31 30 29 27   GLUCOSE 104* 102* 111* 115* 156*  BUN 25* 27* 30* 29* 32*  CREATININE 1.23 1.16 1.15 1.11 1.23  CALCIUM 9.3 9.0 9.1 -- --  MG -- -- -- 2.2 --  PHOS -- -- -- -- --    Liver Function Tests:  Lab 05/10/12 1723  AST 55*  ALT 35  ALKPHOS 74  BILITOT 0.9  PROT 7.1  ALBUMIN 4.0   No results found for this basename: LIPASE:5,AMYLASE:5 in the last 168 hours No results found for this basename: AMMONIA:3 in the last 168 hours  CBC:  Lab 05/12/12 0500 05/10/12 1723  WBC 7.6 11.0*  NEUTROABS -- 8.4*  HGB 11.3* 12.1*  HCT 34.7* 36.4*  MCV 99.1 99.5  PLT 176 205    Cardiac Enzymes: No results found for this basename: CKTOTAL:5,CKMB:5,CKMBINDEX:5,TROPONINI:5 in the last 168 hours  BNP: BNP (last 3 results)  Basename 05/10/12 1830  PROBNP 3888.0*  Imaging: No results found.   Medications:     Scheduled Medications:    . B-complex with vitamin C  1 tablet Oral q morning - 10a  . beta carotene w/minerals  1 tablet Oral Daily  . doxazosin  4 mg Oral QHS  . finasteride  5 mg Oral Daily  . furosemide  80 mg Intravenous BID  . lisinopril  5 mg Oral Daily  . loratadine  10 mg Oral Daily  . metolazone  2.5 mg Oral Q breakfast  . mirtazapine  15 mg Oral QHS  . pantoprazole  40 mg Oral Q1200  . potassium chloride  40 mEq Oral BID  . sertraline  100 mg Oral Daily  . simvastatin  20 mg Oral q1800  . sodium chloride  3 mL Intravenous Q12H  . Tamsulosin HCl  0.8 mg Oral Daily  . warfarin  2.5 mg Oral ONCE-1800  . Warfarin - Pharmacist Dosing Inpatient   Does not apply q1800  . DISCONTD: potassium chloride  40 mEq Oral TID     Infusions:    . milrinone 0.25 mcg/kg/min (05/14/12 0401)    PRN Medications: sodium chloride, acetaminophen, clonazePAM, ondansetron (ZOFRAN) IV, sodium chloride   Assessment:   1.  Acute on Chronic Systolic Heart Failure 2. Ischemic Cardiomyopathy, EF 35-40% 3. Moderate to severe MR, valve area 1.85 cm^2 4. Hypokalemia 5. Fatigue 6. Chronic AF 7. CAD  Plan/Discussion:    Volume status improving on milrinone and IV lasix.  Volume status appears to be nearing baseline, with dose of metolazone this am will cut back IV lasix 40 mg BID.  Cr remains stable, continue to monitor.  Will also cut back Kdur 20 meq BID with K 4.4.    Urinalysis reviewed.  Coag neg culture but no fever/wbc/symptoms therefore will recheck with clean check prior to antibiotic administration.    Appreciate PT/OT assistance, none needed at discharge.  Hopefully home in the next 48-72 hours.    Length of Stay: 4  [name], [facility] 05/14/2012, 9:03 AM  Patient seen and examined with [name], PA-C. We discussed all aspects of the encounter. I agree with the assessment and plan as stated above.   He continues to improve with milrinone and IV lasix. Will change over to po lasix tomorrow and start milrinone wean. Discussed possibility of Hospice care briefly. Will see how he does off milrinone.   Hopefully he can go home Saturday or Sunday.  Urine culture appears to be a contaminant.   Lisanne Ponce,MD 5:11 PM

## 2012-05-14 NOTE — Progress Notes (Signed)
Pt has an order to receive Cardura 4mg  as scheduled. Pt. BP:98/61, HR:76.  MD on call notified of BP. New orders given to hold if SBP < 90.  Will continue to monitor.

## 2012-05-14 NOTE — Progress Notes (Signed)
ANTICOAGULATION CONSULT NOTE - Follow-Up Consult  Pharmacy Consult for Coumadin Indication: atrial fibrillation  Allergies  Allergen Reactions  . Penicillins Other (See Comments)    Unknown reaction many years ago    Patient Measurements: Height: 5\' 8"  (172.7 cm) Weight: 146 lb 12.8 oz (66.588 kg) (a scale) IBW/kg (Calculated) : 68.4   Vital Signs: Temp: 97.5 F (36.4 C) (06/13 0558) Temp src: Oral (06/13 0558) BP: 120/54 mmHg (06/13 0558) Pulse Rate: 74  (06/13 0558)  Labs:  Basename 05/14/12 0532 05/13/12 0500 05/12/12 0500  HGB -- -- 11.3*  HCT -- -- 34.7*  PLT -- -- 176  APTT -- -- --  LABPROT 29.7* 33.8* 31.0*  INR 2.77* 3.27* 2.93*  HEPARINUNFRC -- -- --  CREATININE 1.23 1.16 1.15  CKTOTAL -- -- --  CKMB -- -- --  TROPONINI -- -- --    Estimated Creatinine Clearance: 41.4 ml/min (by C-G formula based on Cr of 1.23).   Assessment: 78 y.o. male on coumadin for afib. INR therapeutic today after dose held yesterday. No bleeding noted. Noted ongoing discussions about hospice care.  Goal of Therapy:  INR 2-3 Monitor platelets by anticoagulation protocol: Yes   Plan:  1. Continue daily PT/INR 2. Coumadin 2.5 mg po today  Christoper Fabian, PharmD, BCPS Clinical pharmacist, pager [phone] 05/14/2012,8:33 AM

## 2012-05-14 NOTE — Progress Notes (Signed)
OT Cancellation Note  Treatment cancelled today due to pt. just received visitors and declines activity at this time.  Naviyah Schaffert, OTR/L Pager [phone]  05/14/2012, 3:02 PM

## 2012-05-14 NOTE — Progress Notes (Signed)
HOME HEALTH AGENCIES SERVING [place]   Agencies that are Medicare-Certified and are affiliated with [name]  Telephone Number Address  [facility]   The [facility] has ownership interest in this company; however, you are under no obligation to use this agency. [phone] or  [phone] [address] [place], [place] [address]   Agencies that are Medicare-Certified and are not affiliated with [name] Telephone Number Address  [facility] [phone] Fax [phone] [address], [address] [place], [place]  [address]  [facility] [phone] or [phone] Fax [phone] [address] [place], [place] [address]  [facility] [phone] Fax [phone] [address] [place], [place] [address]  [facility] [phone] Fax [phone] [address] [place], [place]  [address]  [facility] [phone] Fax [phone] [address], Suite [place], [place] [address]  [facility] [phone] [address] [place], [place] [address]  [facility] [phone]  [address] [place], [place] [address]  [facility] [phone] or [phone] Fax [phone] [address] [place], [place]  [address]  [facility] [phone] Fax [phone] [address] [place], [place]  [address]  [facility]  [phone] Fax [phone] [address] [place], [place] [address]               Agencies that are not Medicare-Certified and are not affiliated with [name] Agency Telephone Number Address  [facility] [place] [phone] or [phone] Fax [phone] [address] [place]  [address]  [facility] [phone] Fax [phone] [address] [place], [place]  [address]  [facility]  [phone] Fax [phone] [address] [place], [place] [address]  [facility] [phone] Fax [phone] [address] [place], [place] [address]  [facility] [phone] or [phone] Fax [phone] [address] [place], [place]  [address]  [facility] [phone] or [phone] Fax [phone] [address], [address] [place], [place]  [address]  [facility] [phone] Fax [phone] [address] [place], [place]  [address]  [facility] [phone] [address] [place], [place]  [address]  [facility] [phone] Fax [phone] [address] [place], [place]  [address]  [facility] [phone] Fax [phone] [address] [place], [place]  [address]  [facility] [phone] Fax [phone] [address] [place], [place] [address]  [facility] [phone] Fax [phone] [address] [place], [place]  [address]   In to offer choice of home health agencies to patient. [facility]. Appropriate referrals will be made.

## 2012-05-14 NOTE — Progress Notes (Signed)
CSW met with patient at the bedside to discuss post-acute placement plans. Patient's daughter's requested to speak with the CSW alone. Patient was agreeable to this. Patient's daughter's reported that the patient needs extra help in the home( patient lives at [facility]). They also reported that Dr. Gala Romney plans to speak to the family and patient this afternoon about returning to [facility]  with possible hospice care. CSW informed the CM that the patient may need HH arranged. CSW will continue to follow and assist with all d/c needs.    Sabino Niemann, MSW, [employer] [phone]

## 2012-05-15 LAB — BASIC METABOLIC PANEL
Chloride: 101 mEq/L (ref 96–112)
GFR calc Af Amer: 68 mL/min — ABNORMAL LOW (ref >90)
Potassium: 4.5 mEq/L (ref 3.5–5.1)
Sodium: 138 mEq/L (ref 135–145)

## 2012-05-15 LAB — PROTIME-INR
INR: 2.04 — ABNORMAL HIGH (ref 0.00–1.49)
Prothrombin Time: 23.4 seconds — ABNORMAL HIGH (ref 11.6–15.2)

## 2012-05-15 MED ORDER — WARFARIN SODIUM 5 MG PO TABS
5.0000 mg | ORAL_TABLET | Freq: Once | ORAL | Status: AC
  Administered 2012-05-15: 5 mg via ORAL
  Filled 2012-05-15: qty 1

## 2012-05-15 NOTE — Progress Notes (Signed)
Occupational Therapy Treatment Patient Details Name: [name] MRN: [id number] DOB: Oct 11, 1924 Today's Date: 05/15/2012 Time: 1012-1028 OT Time Calculation (min): 16 min  OT Assessment / Plan / Recommendation Comments on Treatment Session All education completed, pt will have necessary level of A at discharge and appears to be functioning at baseline with ADLs and mobility.    Follow Up Recommendations  No OT follow up    Barriers to Discharge       Equipment Recommendations  None recommended by OT    Recommendations for Other Services    Frequency     Plan All goals met and education completed, patient discharged from OT services    Precautions / Restrictions Precautions Precautions: Fall Restrictions Weight Bearing Restrictions: No   Pertinent Vitals/Pain     ADL  Grooming: Performed;Teeth care;Min guard;Other (comment) (Pt initiated 2 RBs during activity using seat of rollator.) Equipment Used: Other (comment) (rollator) ADL Comments: Pt and daughter declined education on AE stating that someone would be coming in daily to help him bathe and dress. Pt  does not do any cooking or cleaning, reviewed basic energy conservation strategies during ADLs.    OT Diagnosis:    OT Problem List:   OT Treatment Interventions:     OT Goals ADL Goals ADL Goal: Lower Body Dressing - Progress: Discontinued (comment) (Pt will have necessary level of A at d/c.) ADL Goal: Toilet Transfer - Progress: Not met ADL Goal: Additional Goal #1 - Progress: Met  Visit Information  Last OT Received On: 05/15/12 Assistance Needed: +1    Subjective Data  Subjective: Someones going to be helping me get bathed and dressed.   Prior Functioning       Cognition  Overall Cognitive Status: Appears within functional limits for tasks assessed/performed Arousal/Alertness: Awake/alert Orientation Level: Appears intact for tasks assessed Behavior During Session: [facility] for tasks performed      Mobility Transfers Sit to Stand: 4: Min guard;With upper extremity assist;From chair/3-in-1 Stand to Sit: 4: Min guard;With upper extremity assist;With armrests;To chair/3-in-1 Details for Transfer Assistance: Pt w/good and safe technique.    Exercises    Balance    End of Session OT - End of Session Activity Tolerance: Patient tolerated treatment well Patient left: in chair;with call bell/phone within reach;with family/visitor present   Prisila Dlouhy A OTR/L [phone] 05/15/2012, 10:33 AM

## 2012-05-15 NOTE — Progress Notes (Signed)
ANTICOAGULATION CONSULT NOTE - Follow-Up Consult  Pharmacy Consult for Coumadin Indication: atrial fibrillation  Allergies  Allergen Reactions  . Penicillins Other (See Comments)    Unknown reaction many years ago    Patient Measurements: Height: 5\' 8"  (172.7 cm) Weight: 145 lb 8.1 oz (66 kg) (a scale) IBW/kg (Calculated) : 68.4   Vital Signs: Temp: 97.7 F (36.5 C) (06/14 0436) Temp src: Oral (06/14 0436) BP: 112/66 mmHg (06/14 0436) Pulse Rate: 83  (06/14 0436)  Labs:  Alvira Philips 05/15/12 0640 05/14/12 0532 05/13/12 0500  HGB -- -- --  HCT -- -- --  PLT -- -- --  APTT -- -- --  LABPROT 23.4* 29.7* 33.8*  INR 2.04* 2.77* 3.27*  HEPARINUNFRC -- -- --  CREATININE 1.11 1.23 1.16  CKTOTAL -- -- --  CKMB -- -- --  TROPONINI -- -- --    Estimated Creatinine Clearance: 45.4 ml/min (by C-G formula based on Cr of 1.11).   Assessment: 78 y.o. male on coumadin for afib. INR therapeutic today but decreased due to dose being held 6/12.. No bleeding noted. Plan to start weaning milrinone.  Goal of Therapy:  INR 2-3 Monitor platelets by anticoagulation protocol: Yes   Plan:  1. Continue daily PT/INR 2. Coumadin 5 mg po today  Talbert Cage, PharmD Clinical pharmacist 05/15/2012,8:34 AM

## 2012-05-15 NOTE — Progress Notes (Addendum)
Advanced Heart Failure Rounding Note   Subjective:    [name] is a 78 y.o. gentlemen with history of CAD, ischemic cardiomyopathy associated with systolic HF, EF 35-40%.  He has a Bi-V ICD.  Also history of chronic atrial fib.    RHC 04/10/12:  RA = 11  RV = 44/5/9  PA = 51/25 (37)  PCW = 23 v = 36  Fick cardiac output/index = 4.3/2.4  PVR = 3.0 Woods  FA sat = 96%  PA sat = 61%, 59%  Admitted 6/9 with progressive fatigue and shortness of breath.  pBNP 3888.  IV milrinone started late 6/11.    Weight down 10 pounds since admit.  IV lasix stopped yesterday (6/13)   Made  DNR/DNI/no shocks this admit.  Feels ok.  Ambulating in hall with assistance.  Dyspnea improved.     Objective:   Weight Range: baseline has changed.   Vital Signs:   Temp:  [97.7 F (36.5 C)] 97.7 F (36.5 C) (06/14 0436) Pulse Rate:  [74-83] 83  (06/14 0436) Resp:  [18-20] 20  (06/14 0436) BP: (94-120)/(57-66) 112/66 mmHg (06/14 0436) SpO2:  [95 %-97 %] 97 % (06/14 0436) Weight:  [66 kg (145 lb 8.1 oz)] 66 kg (145 lb 8.1 oz) (06/14 0436) Last BM Date: 05/14/12  Weight change: Filed Weights   05/13/12 0500 05/14/12 0558 05/15/12 0436  Weight: 68.13 kg (150 lb 3.2 oz) 66.588 kg (146 lb 12.8 oz) 66 kg (145 lb 8.1 oz)    Intake/Output:   Intake/Output Summary (Last 24 hours) at 05/15/12 0627 Last data filed at 05/15/12 0436  Gross per 24 hour  Intake  940.8 ml  Output   2099 ml  Net -1158.2 ml     Physical Exam: General:  Elderly frail. Severe kyphosis. No resp difficulty HEENT: normal Neck: supple. JVP to 6-7, +v wave . Carotids 2+ bilat; no bruits. No lymphadenopathy or thryomegaly appreciated. Cor: PMI nondisplaced. Irregular rate & rhythm.  3/6 MR.   Lungs: clear Abdomen: soft, nontender, nondistended. No hepatosplenomegaly. No bruits or masses. Good bowel sounds. Extremities: no cyanosis, clubbing, rash, tr  lower extremity edema  (L>R) Neuro: alert & orientedx3, cranial nerves  grossly intact. moves all 4 extremities w/o difficulty. Affect pleasant  Telemetry: Afib 70s  Labs: Basic Metabolic Panel:  Lab 05/14/12 4098 05/13/12 0500 05/12/12 0500 05/11/12 0642 05/10/12 1723  NA 140 142 143 143 141  K 4.4 4.1 3.2* 3.2* 3.1*  CL 100 102 101 102 97  CO2 32 31 30 29 27   GLUCOSE 104* 102* 111* 115* 156*  BUN 25* 27* 30* 29* 32*  CREATININE 1.23 1.16 1.15 1.11 1.23  CALCIUM 9.3 9.0 9.1 -- --  MG -- -- -- 2.2 --  PHOS -- -- -- -- --    Liver Function Tests:  Lab 05/10/12 1723  AST 55*  ALT 35  ALKPHOS 74  BILITOT 0.9  PROT 7.1  ALBUMIN 4.0   No results found for this basename: LIPASE:5,AMYLASE:5 in the last 168 hours No results found for this basename: AMMONIA:3 in the last 168 hours  CBC:  Lab 05/12/12 0500 05/10/12 1723  WBC 7.6 11.0*  NEUTROABS -- 8.4*  HGB 11.3* 12.1*  HCT 34.7* 36.4*  MCV 99.1 99.5  PLT 176 205    Cardiac Enzymes: No results found for this basename: CKTOTAL:5,CKMB:5,CKMBINDEX:5,TROPONINI:5 in the last 168 hours  BNP: BNP (last 3 results)  Basename 05/10/12 1830  PROBNP 3888.0*  Imaging: No results found.   Medications:     Scheduled Medications:    . B-complex with vitamin C  1 tablet Oral q morning - 10a  . beta carotene w/minerals  1 tablet Oral Daily  . doxazosin  4 mg Oral QHS  . finasteride  5 mg Oral Daily  . lisinopril  5 mg Oral Daily  . loratadine  10 mg Oral Daily  . mirtazapine  15 mg Oral QHS  . pantoprazole  40 mg Oral Q1200  . potassium chloride  20 mEq Oral BID  . sertraline  100 mg Oral Daily  . simvastatin  20 mg Oral q1800  . sodium chloride  3 mL Intravenous Q12H  . Tamsulosin HCl  0.8 mg Oral Daily  . warfarin  2.5 mg Oral ONCE-1800  . Warfarin - Pharmacist Dosing Inpatient   Does not apply q1800  . DISCONTD: furosemide  40 mg Intravenous BID  . DISCONTD: furosemide  40 mg Intravenous Once  . DISCONTD: furosemide  80 mg Intravenous BID  . DISCONTD: metolazone  2.5 mg  Oral Q breakfast  . DISCONTD: potassium chloride  40 mEq Oral BID    Infusions:    . milrinone 0.25 mcg/kg/min (05/15/12 0128)    PRN Medications: sodium chloride, acetaminophen, clonazePAM, ondansetron (ZOFRAN) IV, sodium chloride   Assessment:   1.  Acute on Chronic Systolic Heart Failure 2. Ischemic Cardiomyopathy, EF 35-40% 3. Moderate to severe MR, valve area 1.85 cm^2 4. Hypokalemia 5. Fatigue 6. Chronic AF 7. CAD  Plan/Discussion:    Volume status appears to be nearing new baseline. Resuming po lasix today. Will begin to wean milrinone - decrease to 0.125 today and can stop on Saturday. Await BMET today. Off b-blocker due to fatigue and low-output symptoms.  Now DNR/DNI/no shocks. Discussed possibility of Hospice care briefly. Will see how he does off milrinone.   Hopefully he can go home Sunday with close f/u in HF clinic  Truman Hayward 6:27 AM

## 2012-05-16 LAB — BASIC METABOLIC PANEL
Calcium: 8.8 mg/dL (ref 8.4–10.5)
Creatinine, Ser: 0.96 mg/dL (ref 0.50–1.35)
Glucose, Bld: 98 mg/dL (ref 70–99)
Potassium: 4.3 mEq/L (ref 3.5–5.1)
Sodium: 136 mEq/L (ref 135–145)

## 2012-05-16 LAB — PROTIME-INR: Prothrombin Time: 21.6 seconds — ABNORMAL HIGH (ref 11.6–15.2)

## 2012-05-16 MED ORDER — WARFARIN SODIUM 7.5 MG PO TABS
7.5000 mg | ORAL_TABLET | Freq: Once | ORAL | Status: AC
  Administered 2012-05-16: 7.5 mg via ORAL
  Filled 2012-05-16: qty 1

## 2012-05-16 NOTE — Progress Notes (Signed)
ANTICOAGULATION CONSULT NOTE - Follow-Up Consult  Pharmacy Consult for Coumadin Indication: atrial fibrillation  Allergies  Allergen Reactions  . Penicillins Other (See Comments)    Unknown reaction many years ago    Patient Measurements: Height: 5\' 8"  (172.7 cm) Weight: 146 lb 1.6 oz (66.271 kg) (a scale) IBW/kg (Calculated) : 68.4   Vital Signs: Temp: 97.6 F (36.4 C) (06/15 0457) Temp src: Oral (06/15 0457) BP: 118/80 mmHg (06/15 0457) Pulse Rate: 78  (06/15 0457)  Labs:  Basename 05/16/12 0540 05/15/12 0640 05/14/12 0532  HGB -- -- --  HCT -- -- --  PLT -- -- --  APTT -- -- --  LABPROT 21.6* 23.4* 29.7*  INR 1.84* 2.04* 2.77*  HEPARINUNFRC -- -- --  CREATININE 0.96 1.11 1.23  CKTOTAL -- -- --  CKMB -- -- --  TROPONINI -- -- --    Estimated Creatinine Clearance: 52.8 ml/min (by C-G formula based on Cr of 0.96).   Assessment: 78 y.o. male on coumadin for afib. INR SUBtherapeutic today. No bleeding noted. Plan to start weaning milrinone. Will increase coumadin today  Goal of Therapy:  INR 2-3 Monitor platelets by anticoagulation protocol: Yes   Plan:  1. Continue daily PT/INR 2. Coumadin 7.5 mg po today  Janace Litten, PharmD Clinical pharmacist [phone] 05/16/2012,10:23 AM

## 2012-05-16 NOTE — Clinical Social Work Note (Signed)
CSW advised that patient's family requested to talk with Child psychotherapist. CSW introduced self to patient and family present (wife, 2 sons, and daughter Jossie Ng - [phone]). Family concerned about needed home health services being set in place for patient prior to discharge. After talking with family about needs and concerns, CSW assured family that RN case manager would be contacetd and advised to call Ms. Mann. Call made to weekend case manager and information relayed with the request that Ms. Loreta Ave be contacted regarding home health services.  Genelle Bal, MSW, LCSW [phone]

## 2012-05-16 NOTE — Progress Notes (Signed)
Subjective:    [name] is a 78 y.o. gentlemen with history of CAD, ischemic cardiomyopathy associated with systolic HF, EF 35-40%.  He has a Bi-V ICD.  Also history of chronic atrial fib.    RHC 04/10/12:  RA = 11  RV = 44/5/9  PA = 51/25 (37)  PCW = 23 v = 36  Fick cardiac output/index = 4.3/2.4  PVR = 3.0 Woods  FA sat = 96%  PA sat = 61%, 59%  Admitted 6/9 with progressive fatigue and shortness of breath.  pBNP 3888.  IV milrinone started late 6/11.    Weight down 10 pounds since admit.  IV lasix stopped yesterday (6/13)   Made  DNR/DNI/no shocks this admit.  Feels ok.  Ambulating in hall with assistance.  Dyspnea improved. On low dose milronone.  Objective:   Weight Range: baseline has changed.   Vital Signs:   Temp:  [97.6 F (36.4 C)-98 F (36.7 C)] 97.6 F (36.4 C) (06/15 0457) Pulse Rate:  [74-78] 78  (06/15 0457) Resp:  [18] 18  (06/15 0457) BP: (90-118)/(59-80) 118/80 mmHg (06/15 0457) SpO2:  [94 %-96 %] 96 % (06/15 0457) Weight:  [66.271 kg (146 lb 1.6 oz)] 66.271 kg (146 lb 1.6 oz) (06/15 0457) Last BM Date: 05/15/12  Weight change: Filed Weights   05/14/12 0558 05/15/12 0436 05/16/12 0457  Weight: 66.588 kg (146 lb 12.8 oz) 66 kg (145 lb 8.1 oz) 66.271 kg (146 lb 1.6 oz)    Intake/Output:   Intake/Output Summary (Last 24 hours) at 05/16/12 1111 Last data filed at 05/16/12 1108  Gross per 24 hour  Intake 1070.33 ml  Output   1200 ml  Net -129.67 ml     Physical Exam: General:  Elderly frail. Severe kyphosis. No resp difficulty. Smiling and in no distress. HEENT: normal Neck: supple. JVP to 6-7, +v wave . Carotids 2+ bilat; no bruits. No lymphadenopathy or thryomegaly appreciated. Cor: PMI nondisplaced. Irregular rate & rhythm.  3/6 MR.   Lungs: clear Abdomen: soft, nontender, nondistended. No hepatosplenomegaly. No bruits or masses. Good bowel sounds. Extremities: no cyanosis, clubbing, rash,moderate  lower extremity edema  (L>R) Neuro:  alert & orientedx3, cranial nerves grossly intact. moves all 4 extremities w/o difficulty. Affect pleasant  Telemetry: Afib 70s  Labs: Basic Metabolic Panel:  Lab 05/16/12 1610 05/15/12 0640 05/14/12 0532 05/13/12 0500 05/12/12 0500 05/11/12 0642  NA 136 138 140 142 143 --  K 4.3 4.5 4.4 4.1 3.2* --  CL 101 101 100 102 101 --  CO2 26 28 32 31 30 --  GLUCOSE 98 103* 104* 102* 111* --  BUN 24* 25* 25* 27* 30* --  CREATININE 0.96 1.11 1.23 1.16 1.15 --  CALCIUM 8.8 8.9 9.3 -- -- --  MG -- -- -- -- -- 2.2  PHOS -- -- -- -- -- --    Liver Function Tests:  Lab 05/10/12 1723  AST 55*  ALT 35  ALKPHOS 74  BILITOT 0.9  PROT 7.1  ALBUMIN 4.0   No results found for this basename: LIPASE:5,AMYLASE:5 in the last 168 hours No results found for this basename: AMMONIA:3 in the last 168 hours  CBC:  Lab 05/12/12 0500 05/10/12 1723  WBC 7.6 11.0*  NEUTROABS -- 8.4*  HGB 11.3* 12.1*  HCT 34.7* 36.4*  MCV 99.1 99.5  PLT 176 205    Cardiac Enzymes: No results found for this basename: CKTOTAL:5,CKMB:5,CKMBINDEX:5,TROPONINI:5 in the last 168 hours  BNP: BNP (last  3 results)  [facility] 05/10/12 1830  PROBNP 3888.0*      Imaging: No results found.   Medications:     Scheduled Medications:    . B-complex with vitamin C  1 tablet Oral q morning - 10a  . beta carotene w/minerals  1 tablet Oral Daily  . doxazosin  4 mg Oral QHS  . finasteride  5 mg Oral Daily  . lisinopril  5 mg Oral Daily  . loratadine  10 mg Oral Daily  . mirtazapine  15 mg Oral QHS  . pantoprazole  40 mg Oral Q1200  . potassium chloride  20 mEq Oral BID  . sertraline  100 mg Oral Daily  . simvastatin  20 mg Oral q1800  . sodium chloride  3 mL Intravenous Q12H  . Tamsulosin HCl  0.8 mg Oral Daily  . warfarin  5 mg Oral ONCE-1800  . warfarin  7.5 mg Oral ONCE-1800  . Warfarin - Pharmacist Dosing Inpatient   Does not apply q1800    Infusions:    . DISCONTD: milrinone 0.125 mcg/kg/min (05/15/12  0653)    PRN Medications: sodium chloride, acetaminophen, clonazePAM, ondansetron (ZOFRAN) IV, sodium chloride   Assessment:   1.  Acute on Chronic Systolic Heart Failure 2. Ischemic Cardiomyopathy, EF 35-40% 3. Moderate to severe MR, valve area 1.85 cm^2 4. Hypokalemia 5. Fatigue 6. Chronic AF 7. CAD  Plan/Discussion:    Volume status appears to be nearing new baseline. Resuming po lasix today. Will stop milronone today. BMET is stable. Off b-blocker due to fatigue and low-output symptoms.  Now DNR/DNI/no shocks. Discussed possibility of Hospice care briefly. Will see how he does off milrinone. I spoke with his daughter.  She is comfortable with him going back to his apartment Sunday if he remains stable off milronone.  Hopefully he can go home Sunday with close f/u in HF clinic  Terryl Molinelli,MD 11:11 AM

## 2012-05-17 LAB — PROTIME-INR: Prothrombin Time: 20.7 seconds — ABNORMAL HIGH (ref 11.6–15.2)

## 2012-05-17 LAB — BASIC METABOLIC PANEL
BUN: 27 mg/dL — ABNORMAL HIGH (ref 6–23)
CO2: 25 mEq/L (ref 19–32)
Chloride: 102 mEq/L (ref 96–112)
GFR calc Af Amer: 86 mL/min — ABNORMAL LOW (ref >90)
Potassium: 4.4 mEq/L (ref 3.5–5.1)

## 2012-05-17 MED ORDER — FUROSEMIDE 40 MG PO TABS
40.0000 mg | ORAL_TABLET | Freq: Every day | ORAL | Status: DC
  Administered 2012-05-17: 40 mg via ORAL
  Filled 2012-05-17: qty 1

## 2012-05-17 MED ORDER — FUROSEMIDE 40 MG PO TABS
40.0000 mg | ORAL_TABLET | Freq: Two times a day (BID) | ORAL | Status: DC
  Administered 2012-05-17 – 2012-05-18 (×2): 40 mg via ORAL
  Filled 2012-05-17 (×4): qty 1

## 2012-05-17 MED ORDER — WARFARIN SODIUM 7.5 MG PO TABS
7.5000 mg | ORAL_TABLET | Freq: Once | ORAL | Status: AC
  Administered 2012-05-17: 7.5 mg via ORAL
  Filled 2012-05-17: qty 1

## 2012-05-17 NOTE — Progress Notes (Signed)
ANTICOAGULATION CONSULT NOTE - Follow-Up Consult  Pharmacy Consult for Coumadin Indication: atrial fibrillation  Allergies  Allergen Reactions  . Penicillins Other (See Comments)    Unknown reaction many years ago    Patient Measurements: Height: 5\' 8"  (172.7 cm) Weight: 148 lb 2.4 oz (67.2 kg) (Scale A.) IBW/kg (Calculated) : 68.4   Vital Signs: Temp: 97.3 F (36.3 C) (06/16 0357) Temp src: Oral (06/16 0357) BP: 126/81 mmHg (06/16 0357) Pulse Rate: 82  (06/16 0357)  Labs:  Basename 05/17/12 0525 05/16/12 0540 05/15/12 0640  HGB -- -- --  HCT -- -- --  PLT -- -- --  APTT -- -- --  LABPROT 20.7* 21.6* 23.4*  INR 1.74* 1.84* 2.04*  HEPARINUNFRC -- -- --  CREATININE 0.93 0.96 1.11  CKTOTAL -- -- --  CKMB -- -- --  TROPONINI -- -- --    Estimated Creatinine Clearance: 55.2 ml/min (by C-G formula based on Cr of 0.93).   Assessment: 78 y.o. male on coumadin for afib. INR SUBtherapeutic today. No bleeding noted. No bleeding noted. Home dose of coumadin 5 mg daily except for 2.5 mg on Wed and Sun.   Goal of Therapy:  INR 2-3 Monitor platelets by anticoagulation protocol: Yes   Plan:  1. Continue daily PT/INR 2. Coumadin 7.5 mg po today  Janace Litten, PharmD Clinical pharmacist [phone] 05/17/2012,10:09 AM

## 2012-05-17 NOTE — Progress Notes (Signed)
Subjective:    [name] is a 78 y.o. gentlemen with history of CAD, ischemic cardiomyopathy associated with systolic HF, EF 35-40%.  He has a Bi-V ICD.  Also history of chronic atrial fib.    RHC 04/10/12:  RA = 11  RV = 44/5/9  PA = 51/25 (37)  PCW = 23 v = 36  Fick cardiac output/index = 4.3/2.4  PVR = 3.0 Woods  FA sat = 96%  PA sat = 61%, 59%  Admitted 6/9 with progressive fatigue and shortness of breath.  pBNP 3888.  IV milrinone started late 6/11.    Weight is back up 2 lb but he did not receive any lasix yesterday.  Made  DNR/DNI/no shocks this admit.  Feels ok.  Ambulating in hall with assistance.  Dyspnea unchanged.  Milronone was stopped yesterday.  Objective:   Weight Range: baseline has changed.   Vital Signs:   Temp:  [97 F (36.1 C)-98.2 F (36.8 C)] 97.3 F (36.3 C) (06/16 0357) Pulse Rate:  [76-82] 82  (06/16 0357) Resp:  [18-20] 20  (06/16 0357) BP: (99-126)/(62-81) 126/81 mmHg (06/16 0357) SpO2:  [95 %-97 %] 97 % (06/16 0357) Weight:  [67.2 kg (148 lb 2.4 oz)] 67.2 kg (148 lb 2.4 oz) (06/16 0357) Last BM Date: 05/17/12  Weight change: Filed Weights   05/15/12 0436 05/16/12 0457 05/17/12 0357  Weight: 66 kg (145 lb 8.1 oz) 66.271 kg (146 lb 1.6 oz) 67.2 kg (148 lb 2.4 oz)    Intake/Output:   Intake/Output Summary (Last 24 hours) at 05/17/12 1610 Last data filed at 05/17/12 9604  Gross per 24 hour  Intake    600 ml  Output    800 ml  Net   -200 ml     Physical Exam: General:  Elderly frail. Severe kyphosis. No resp difficulty. Smiling and in no distress. HEENT: normal Neck: supple. JVP to 6-7, +v wave . Carotids 2+ bilat; no bruits. No lymphadenopathy or thryomegaly appreciated. Cor: PMI nondisplaced. Irregular rate & rhythm.  3/6 MR.   Lungs: clear Abdomen: soft, nontender, nondistended. No hepatosplenomegaly. No bruits or masses. Good bowel sounds. Extremities: no cyanosis, clubbing, rash,moderate  lower extremity edema  (L>R),  increased since yesterday. Neuro: alert & orientedx3, cranial nerves grossly intact. moves all 4 extremities w/o difficulty. Affect pleasant  Telemetry: Atrial fib, Vpacing  Labs: Basic Metabolic Panel:  Lab 05/17/12 5409 05/16/12 0540 05/15/12 0640 05/14/12 0532 05/13/12 0500 05/11/12 0642  NA 136 136 138 140 142 --  K 4.4 4.3 4.5 4.4 4.1 --  CL 102 101 101 100 102 --  CO2 25 26 28  32 31 --  GLUCOSE 86 98 103* 104* 102* --  BUN 27* 24* 25* 25* 27* --  CREATININE 0.93 0.96 1.11 1.23 1.16 --  CALCIUM 9.0 8.8 8.9 -- -- --  MG -- -- -- -- -- 2.2  PHOS -- -- -- -- -- --    Liver Function Tests:  Lab 05/10/12 1723  AST 55*  ALT 35  ALKPHOS 74  BILITOT 0.9  PROT 7.1  ALBUMIN 4.0   No results found for this basename: LIPASE:5,AMYLASE:5 in the last 168 hours No results found for this basename: AMMONIA:3 in the last 168 hours  CBC:  Lab 05/12/12 0500 05/10/12 1723  WBC 7.6 11.0*  NEUTROABS -- 8.4*  HGB 11.3* 12.1*  HCT 34.7* 36.4*  MCV 99.1 99.5  PLT 176 205    Cardiac Enzymes: No results found for  this basename: CKTOTAL:5,CKMB:5,CKMBINDEX:5,TROPONINI:5 in the last 168 hours  BNP: BNP (last 3 results)  Basename 05/10/12 1830  PROBNP 3888.0*      Imaging: No results found.   Medications:     Scheduled Medications:    . B-complex with vitamin C  1 tablet Oral q morning - 10a  . beta carotene w/minerals  1 tablet Oral Daily  . doxazosin  4 mg Oral QHS  . finasteride  5 mg Oral Daily  . furosemide  40 mg Oral Daily  . lisinopril  5 mg Oral Daily  . loratadine  10 mg Oral Daily  . mirtazapine  15 mg Oral QHS  . pantoprazole  40 mg Oral Q1200  . potassium chloride  20 mEq Oral BID  . sertraline  100 mg Oral Daily  . simvastatin  20 mg Oral q1800  . sodium chloride  3 mL Intravenous Q12H  . Tamsulosin HCl  0.8 mg Oral Daily  . warfarin  7.5 mg Oral ONCE-1800  . Warfarin - Pharmacist Dosing Inpatient   Does not apply q1800    Infusions:    .  DISCONTD: milrinone Stopped (05/16/12 1128)    PRN Medications: sodium chloride, acetaminophen, clonazePAM, ondansetron (ZOFRAN) IV, sodium chloride   Assessment:   1.  Acute on Chronic Systolic Heart Failure 2. Ischemic Cardiomyopathy, EF 35-40% 3. Moderate to severe MR, valve area 1.85 cm^2 4. Hypokalemia 5. Fatigue 6. Chronic AF 7. CAD  Plan/Discussion:    Weight up slightly today. Restarting oral lasix today.  Observe response.  Now DNR/DNI/no shocks. Family uncomfortable about taking him home until tomorrow. They would like to discuss post-hospital plans (? Hospice?) further with Dr. Jesusita Oka tomorrow.    Can Lucci,MD 9:38 AM

## 2012-05-18 LAB — BASIC METABOLIC PANEL
Chloride: 98 mEq/L (ref 96–112)
GFR calc Af Amer: 71 mL/min — ABNORMAL LOW (ref >90)
GFR calc non Af Amer: 61 mL/min — ABNORMAL LOW (ref >90)
Glucose, Bld: 94 mg/dL (ref 70–99)
Potassium: 4.5 mEq/L (ref 3.5–5.1)
Sodium: 133 mEq/L — ABNORMAL LOW (ref 135–145)

## 2012-05-18 LAB — PROTIME-INR: Prothrombin Time: 20.8 seconds — ABNORMAL HIGH (ref 11.6–15.2)

## 2012-05-18 MED ORDER — WARFARIN SODIUM 7.5 MG PO TABS
7.5000 mg | ORAL_TABLET | Freq: Once | ORAL | Status: DC
  Filled 2012-05-18: qty 1

## 2012-05-18 MED ORDER — FUROSEMIDE 40 MG PO TABS: 40.0000 mg | ORAL_TABLET | Freq: Two times a day (BID) | ORAL | Status: DC

## 2012-05-18 NOTE — Discharge Summary (Signed)
Advanced Heart Failure Team  Discharge Summary   Patient ID: EBUBECHUKWU JEDLICKA MRN: [id number], DOB/AGE: 06/02/1924 78 y.o. Admit date: 05/10/2012 D/C date:     05/18/2012   Primary Discharge Diagnoses:  1. Acute on Chronic Systolic Heart Failure  2. Ischemic Cardiomyopathy, EF 35-40%  3. Moderate to severe MR, valve area 1.85 cm^2  4. Hypokalemia  5. Fatigue  6. Chronic AF  7. CAD 8. DNR/DNI/no shocks  Secondary Discharge Diagnoses:  1. Hypertension 2. Hyperlipidemia 3. BPH  Hospital Course:  Mr. Bulnes is a 78 y.o. gentlemen with history of CAD, ischemic cardiomyopathy associated with systolic HF, EF 35-40%. He also has chronic Afib and history of CRT-P upgrade.  He underwent RHC on 04/10/12 showing very mildly elevated right and left sided pressures with normal cardiac output with prominent v-waves suggestive of significant MR.  Over the last month he has experienced progressive fatigue and shortness of breath that was not responsive to increased lasix and prn metolazone.  Initial pro BNP 3888.    He was admitted to the telemetry unit for IV diuresis.  On lasix 80 mg IV BID with metolazone he diuresed ~3 liters then diurese began to slow therefore milrinone support was initiated on 6/11.  Diuresis improved on milrinone with a total of 9 liters out during admission.  Weight down 9 pounds to 146 pounds.  Milrinone wean began on 6/14 and was stopped on 6/15.  No beta blocker due to low-out HF.  He was transitioned to oral lasix on 6/15, this dose was increased to 40 mg BID.    It was felt that the patient was nearing end of life as he has had rapid decline over the last few months.  A discussion concerning code status was undertaken with the patient, his family and Dr. Gala Romney was had on 6/12 and it was decided DNR/DNI and no shock but inotrope therapy was ok.  Prior to discharge the family requested a palliative care/hospice consult.  There assistance was greatly appreciated.  He will be  discharged with home hospice through [facility] [place]. through [facility].    On day of discharge the patient was felt stable for home with close outpatient follow up.  His dyspnea had improved and he denies orthopnea/PND.    Discharge Weight Range: 144-148 pounds Discharge Vitals: Blood pressure 100/56, pulse 76, temperature 97.6 F (36.4 C), temperature source Oral, resp. rate 19, height 5\' 8"  (1.727 m), weight 66.6 kg (146 lb 13.2 oz), SpO2 96.00%.  Physical Exam:  General: Well appearing. No resp difficulty  HEENT: normal  Neck: supple. JVP to 6-7, +v wave . Carotids 2+ bilat; no bruits. No lymphadenopathy or thryomegaly appreciated.  Cor: PMI nondisplaced. Irregular rate & rhythm. 3/6 MR.  Lungs: clear  Abdomen: soft, nontender, nondistended. No hepatosplenomegaly. No bruits or masses. Good bowel sounds.  Extremities: no cyanosis, clubbing, rash, trace lower extremity edema (R>L)  Neuro: alert & orientedx3, cranial nerves grossly intact. moves all 4 extremities w/o difficulty. Affect pleasant   Telemetry: Afib 70s  Labs: Lab Results  Component Value Date   WBC 7.6 05/12/2012   HGB 11.3* 05/12/2012   HCT 34.7* 05/12/2012   MCV 99.1 05/12/2012   PLT 176 05/12/2012     Lab 05/18/12 0500  NA 133*  K 4.5  CL 98  CO2 27  BUN 25*  CREATININE 1.07  CALCIUM 9.0  PROT --  BILITOT --  ALKPHOS --  ALT --  AST --  GLUCOSE 94   Lab  Results  Component Value Date   CHOL 154 01/09/2011   HDL 61.50 01/09/2011   LDLCALC 73 01/09/2011   TRIG 100.0 01/09/2011   BNP (last 3 results)  Basename 05/10/12 1830  PROBNP 3888.0*    Diagnostic Studies/Procedures   No results found.  Discharge Medications   Medication List  As of 05/18/2012  5:10 PM   TAKE these medications         clonazePAM 0.5 MG tablet   Commonly known as: KLONOPIN   Take 0.25 mg by mouth 2 (two) times daily as needed. anxiety      doxazosin 4 MG tablet   Commonly known as: CARDURA   Take 4 mg by mouth at bedtime.       ergocalciferol 50000 UNITS capsule   Commonly known as: VITAMIN D2   Take 50,000 Units by mouth once a week. On Fridays      fexofenadine 180 MG tablet   Commonly known as: ALLEGRA   Take 1 tablet (180 mg total) by mouth daily.      finasteride 5 MG tablet   Commonly known as: PROSCAR   Take 5 mg by mouth daily.      furosemide 40 MG tablet   Commonly known as: LASIX   Take 1 tablet (40 mg total) by mouth 2 (two) times daily.      Melatonin 3 MG Caps   Take 3 mg by mouth at bedtime.      metolazone 2.5 MG tablet   Commonly known as: ZAROXOLYN   Take 2.5 mg by mouth daily as needed. Additional fluid      mirtazapine 15 MG tablet   Commonly known as: REMERON   Take 1 tablet (15 mg total) by mouth at bedtime.      OCUVITE PO   Take 1 tablet by mouth daily.      omeprazole 20 MG capsule   Commonly known as: PRILOSEC   Take 20 mg by mouth daily.      potassium chloride SA 20 MEQ tablet   Commonly known as: K-DUR,KLOR-CON   Take 20 mEq by mouth daily.      pravastatin 40 MG tablet   Commonly known as: PRAVACHOL   Take 40 mg by mouth daily.      sertraline 100 MG tablet   Commonly known as: ZOLOFT   Take 1 tablet (100 mg total) by mouth daily.      SUPER B COMPLEX/VITAMIN C PO   Take 1 tablet by mouth daily.      Tamsulosin HCl 0.4 MG Caps   Commonly known as: FLOMAX   Take 0.8 mg by mouth daily.      warfarin 5 MG tablet   Commonly known as: COUMADIN   Take 2.5-5 mg by mouth daily. 1/2 tablet (2.5 mg) on Wednesday and Sunday, 1 tablet (5 mg) on remainder of days            Disposition   The patient will be discharged in stable condition to home. Discharge Orders    Future Appointments: Provider: Department: Dept Phone: Center:   05/25/2012 1:45 PM [facility] [phone] None   07/01/2012 1:45 PM Newt Lukes, MD [facility] [phone] [facility]     Future Orders Please Complete By Expires   Diet - low sodium heart healthy        Increase activity slowly      Heart Failure patients record your daily weight using the same scale at  the same time of day      (HEART FAILURE PATIENTS) Call MD:  Anytime you have any of the following symptoms: 1) 3 pound weight gain in 24 hours or 5 pounds in 1 week 2) shortness of breath, with or without a dry hacking cough 3) swelling in the hands, feet or stomach 4) if you have to sleep on extra pillows at night in order to breathe.      ACE Inhibitor / ARB already ordered      Contraindication beta blocker-discharge        Follow-up Information    Follow up with Arvilla Meres, MD on 05/25/2012. (1:45 p   (Gate Code 0700))    Contact information:   [address] [place] [address] [phone]            Duration of Discharge Encounter: Greater than 35 minutes   Signed, [name]  05/18/2012, 5:10 PM   Patient seen and examined with [name], PA-C. We discussed all aspects of the encounter. I agree with the assessment and plan as stated above. He is much improved with diuresis. Weight down 9 pounds. Long talk with patient and family about [facility] RN vs Hospice Care. They would like to discuss with Palliative Care team. We will arrange that for today withplanned discharge afterward. F/u in HF Clinic in 1 week.   Fenix Ruppe,MD 8:07 AM

## 2012-05-18 NOTE — Progress Notes (Signed)
ANTICOAGULATION CONSULT NOTE - Follow Up Consult  Pharmacy Consult for Coumadin Indication: atrial fibrillation  Allergies  Allergen Reactions  . Penicillins Other (See Comments)    Unknown reaction many years ago   Vital Signs: Temp: 97.4 F (36.3 C) (06/17 0605) Temp src: Oral (06/17 0605) BP: 99/66 mmHg (06/17 1053) Pulse Rate: 75  (06/17 1053)  Labs:  Basename 05/18/12 0500 05/17/12 0525 05/16/12 0540  HGB -- -- --  HCT -- -- --  PLT -- -- --  APTT -- -- --  LABPROT 20.8* 20.7* 21.6*  INR 1.76* 1.74* 1.84*  HEPARINUNFRC -- -- --  CREATININE 1.07 0.93 0.96  CKTOTAL -- -- --  CKMB -- -- --  TROPONINI -- -- --    Estimated Creatinine Clearance: 47.5 ml/min (by C-G formula based on Cr of 1.07).  Assessment: 87yom continues on coumadin with a subtherapeutic INR despite 2 larger doses of 7.5mg  given last two days. Dose held 6/12. Probably needs one more day of larger dose than might be able to resume home regimen. No bleeding per chart notes. Milrinone off.   Home dose: 5mg  daily except 2.5mg  on Wed and Sun  Goal of Therapy:  INR 2-3  Plan:  1) Repeat coumadin 7.5mg  x 1 2) INR in AM  Fredrik Rigger 05/18/2012,12:06 PM

## 2012-05-18 NOTE — Progress Notes (Signed)
IV d/c'd.  Tele d/c'd.  Pt d/c'd to independent living facility.  Home meds and d/c instructions have been discussed with pt and pt family.  Both deny any questions or concerns at this time.  Pt leaving unit via wheelchair and appears in no acute distress. Nino Glow RN

## 2012-05-18 NOTE — Consult Note (Signed)
Patient [name]      DOB: 01/28/1924      VWU:[id number]  Received a consult to discuss post discharge planning with patient and daughters which initially sounded as if it was necessary to do full goals of care.  I reviewed the case with [name]  From Heart failure, who felt comfortable with patient's current symptom management and competency of family to care for him this evening while awaiting Hospice enrollment.  Patient and family clearly stated that they understood the prognosis that Dr. Clarise Cruz has discussed with them.  They clearly stated that they understand that Hospice Care is meant to assist in providing comfort care not continued curative care. They expressed to me their reason for requesting to speak with palliative medicine was to get the best care for their dad at home with the understanding that he has poor prognosis.  They would like to have further discussions with their mother present and so are requesting consideration for Hospice care through [facility] and [facility].  Please note, currently, the patient is listed as partial code which for the heart failure team means the use of vasopressors and  antiarrythmics when in the hospital, however, I clarified with Heart Failure and the patient's daughter that home status would be full DNR.  I will drop a  Yellow form off at the patient's home.  We will assure hospice services are initiated asap in the am.      Total time with the patient 20 min.   [name] L. Ladona Ridgel, MD MBA The Palliative Medicine Team at [facility] Phone: [phone] Pager: [phone]

## 2012-05-18 NOTE — Progress Notes (Signed)
Palliative Medicine team consult for goals of care discussion requested by [name] PA/Dr Bensihmon spoke with patient and daughters Rebecca Eaton at bedside meeting confirmed for today Monday 6/17 @ 4:00 pm  Valente David, RN 05/18/2012, 3:34 PM Palliative Medicine Team RN Liaison [phone]

## 2012-05-19 DIAGNOSIS — I509 Heart failure, unspecified: Secondary | ICD-10-CM

## 2012-05-25 ENCOUNTER — Encounter (HOSPITAL_COMMUNITY): Payer: Self-pay

## 2012-05-25 ENCOUNTER — Ambulatory Visit (HOSPITAL_COMMUNITY)
Admission: RE | Admit: 2012-05-25 | Discharge: 2012-05-25 | Disposition: A | Discharge status: Home / Self Care | Source: Ambulatory Visit | Attending: Internal Medicine | Admitting: Internal Medicine

## 2012-05-25 ENCOUNTER — Telehealth (HOSPITAL_COMMUNITY): Payer: Self-pay | Admitting: *Deleted

## 2012-05-25 VITALS — BP 106/58 | HR 84 | Resp 18 | Ht 68.0 in | Wt 142.4 lb

## 2012-05-25 DIAGNOSIS — K449 Diaphragmatic hernia without obstruction or gangrene: Secondary | ICD-10-CM | POA: Insufficient documentation

## 2012-05-25 DIAGNOSIS — F329 Major depressive disorder, single episode, unspecified: Secondary | ICD-10-CM | POA: Insufficient documentation

## 2012-05-25 DIAGNOSIS — F3289 Other specified depressive episodes: Secondary | ICD-10-CM | POA: Insufficient documentation

## 2012-05-25 DIAGNOSIS — I2589 Other forms of chronic ischemic heart disease: Secondary | ICD-10-CM | POA: Insufficient documentation

## 2012-05-25 DIAGNOSIS — D649 Anemia, unspecified: Secondary | ICD-10-CM | POA: Insufficient documentation

## 2012-05-25 DIAGNOSIS — I2510 Atherosclerotic heart disease of native coronary artery without angina pectoris: Secondary | ICD-10-CM | POA: Insufficient documentation

## 2012-05-25 DIAGNOSIS — M810 Age-related osteoporosis without current pathological fracture: Secondary | ICD-10-CM | POA: Insufficient documentation

## 2012-05-25 DIAGNOSIS — I498 Other specified cardiac arrhythmias: Secondary | ICD-10-CM | POA: Insufficient documentation

## 2012-05-25 DIAGNOSIS — H409 Unspecified glaucoma: Secondary | ICD-10-CM | POA: Insufficient documentation

## 2012-05-25 DIAGNOSIS — Z7901 Long term (current) use of anticoagulants: Secondary | ICD-10-CM | POA: Insufficient documentation

## 2012-05-25 DIAGNOSIS — N400 Enlarged prostate without lower urinary tract symptoms: Secondary | ICD-10-CM | POA: Insufficient documentation

## 2012-05-25 DIAGNOSIS — I059 Rheumatic mitral valve disease, unspecified: Secondary | ICD-10-CM | POA: Insufficient documentation

## 2012-05-25 DIAGNOSIS — I5022 Chronic systolic (congestive) heart failure: Secondary | ICD-10-CM | POA: Insufficient documentation

## 2012-05-25 DIAGNOSIS — I10 Essential (primary) hypertension: Secondary | ICD-10-CM | POA: Insufficient documentation

## 2012-05-25 DIAGNOSIS — E785 Hyperlipidemia, unspecified: Secondary | ICD-10-CM | POA: Insufficient documentation

## 2012-05-25 DIAGNOSIS — I4891 Unspecified atrial fibrillation: Secondary | ICD-10-CM | POA: Insufficient documentation

## 2012-05-25 DIAGNOSIS — Z880 Allergy status to penicillin: Secondary | ICD-10-CM | POA: Insufficient documentation

## 2012-05-25 DIAGNOSIS — I509 Heart failure, unspecified: Secondary | ICD-10-CM | POA: Insufficient documentation

## 2012-05-25 MED ORDER — FUROSEMIDE 40 MG PO TABS: 40.0000 mg | ORAL_TABLET | Freq: Every day | ORAL | Status: DC

## 2012-05-25 NOTE — Assessment & Plan Note (Addendum)
Volume status stable, much improved since hospitalization.  NYHA III.  Doing well at home with hospice support.  With recent weight loss will have him cut back lasix 40 mg daily and if weight reaches 145 pounds or greater he will take 40 mg BID.  The patient and his family voice understanding.  He will call if weight continues to decline.  Follow up in 1 month.   Patient seen and examined with [name], PA-C. We discussed all aspects of the encounter. I agree with the assessment and plan as stated above. He is much improved since his recent hospitalization. Down 10 pounds. Agree with cutting back lasix. Reinforced need for daily weights and reviewed use of sliding scale diuretics. Reinforced need for hospice support.

## 2012-05-25 NOTE — Patient Instructions (Addendum)
Take 40 mg lasix daily.  If weight hits 145 pounds or greater take lasix 40 mg twice that day.    Follow up with Dr. Gala Romney in 1 month.

## 2012-05-25 NOTE — Telephone Encounter (Signed)
Spoke w/Hospice RN, we will address issues at visit today

## 2012-05-25 NOTE — Telephone Encounter (Signed)
[name] from Hospice called in regards to [name] on Friday.  She would like to speak with someone regarding his medications and his constant weight changes.  If this was addressed last week, please disregard this message. Thank you.

## 2012-05-25 NOTE — Assessment & Plan Note (Addendum)
Chronic.  Continue rate control and coumadin.  Attending: Agree. Given risk of bleeding may consider apixaban at some point as risk of bleeding is lower.

## 2012-05-25 NOTE — Progress Notes (Signed)
Primary Cardiologist:  Dr. Arvilla Meres  HPI:   [name] is a delightful 78 y.o. male with history congestive heart failure secondary to ischemic cardiomyopathy.  He has a totally occluded LAD on cardiac catheterization with nonobstructive disease elsewhere.  Andre EF previously in the 25-30% range. However echo 04/2010 showed EF 45% with mod-sev MR. He is status post BiV ICD.  He has also had problems with atrial fibrillation and undergone several cardioversions (and several anti-arrhhtymics) but now with chronic atrial fib.    12/2011: ECHO: EF 35-40%, hypokinesis of apical myocardium and septal myocardium. Moderate MR with valve area 1.85 cm^2.  LA severely dilated, RA moderately dilated.  He returns for post hospital follow up.  Admitted 6/9-6/17 for decompensated heart failure requiring milrinone for diuresis.  He diuresed 9 liters, discharge weight 146 pounds.  The patient and family decided on palliative care consult with hospice at discharge through [facility].  Decided on DNR.  He is here with [name], says he feels pretty good.  Andre weight has continued to fall at home from 145 pounds to 141 pounds, he cut back on lasix 40 mg daily and weight up to 143 pounds.  He says he is ambulating the halls of Andre building without much difficulty.  Chronic mild elevation of bed.  No PND.  No dizziness/syncope.     ROS: All other systems normal except as listed in the HPI and Problem List.   Past Medical History  Diagnosis Date  . Systolic CHF, chronic   . Ischemic cardiomyopathy     a.  echo 8/08: EF 20-30%;  b. echo 5/11: EF 45%, mod to severe MR  . Permanent atrial fibrillation     Amiodarone stopped due to recurrent AFib  . CAD (coronary artery disease)     a. cath 9/06: occluded LAD with L-L and R-L collaterals; LM 20%, CFX 20%, OM1 30%, RCA 20-30%, EF 45%  . Mitral regurgitation     a. mod to severe by echo 5/11  . Anemia     2/2 GI bleed  . Symptomatic  bradycardia     a. s/p BiV-ICD;  b. s/p change to BiV pacer only 12/11  . HTN (hypertension)   . HLD (hyperlipidemia)   . Osteoporosis   . BPH (benign prostatic hypertrophy)   . Glaucoma   . Urinary retention   . Depression   . Cataracts, bilateral     Retinal bleeding after cataract removal  . Glaucoma   . Hiatal hernia     Current Outpatient Prescriptions  Medication Sig Dispense Refill  . B Complex-C (SUPER B COMPLEX/VITAMIN C PO) Take 1 tablet by mouth daily.      . clonazePAM (KLONOPIN) 0.5 MG tablet Take 0.25 mg by mouth 2 (two) times daily as needed. anxiety      . doxazosin (CARDURA) 4 MG tablet Take 4 mg by mouth at bedtime.      . ergocalciferol (VITAMIN D2) 50000 UNITS capsule Take 50,000 Units by mouth once a week. On Fridays      . fexofenadine (ALLEGRA) 180 MG tablet Take 1 tablet (180 mg total) by mouth daily.  30 tablet  5  . finasteride (PROSCAR) 5 MG tablet Take 5 mg by mouth daily.      . furosemide (LASIX) 40 MG tablet Take 1 tablet (40 mg total) by mouth 2 (two) times daily.  60 tablet  3  . Melatonin 3 MG CAPS Take 3 mg  by mouth at bedtime.       . metolazone (ZAROXOLYN) 2.5 MG tablet Take 2.5 mg by mouth daily as needed. Additional fluid      . mirtazapine (REMERON) 15 MG tablet Take 1 tablet (15 mg total) by mouth at bedtime.  30 tablet  3  . Multiple Vitamins-Minerals (OCUVITE PO) Take 1 tablet by mouth daily.       Marland Kitchen omeprazole (PRILOSEC) 20 MG capsule Take 20 mg by mouth daily.      . potassium chloride SA (K-DUR,KLOR-CON) 20 MEQ tablet Take 20 mEq by mouth daily.      . pravastatin (PRAVACHOL) 40 MG tablet Take 40 mg by mouth daily.      . sertraline (ZOLOFT) 100 MG tablet Take 1 tablet (100 mg total) by mouth daily.  30 tablet  6  . Tamsulosin HCl (FLOMAX) 0.4 MG CAPS Take 0.8 mg by mouth daily.       Marland Kitchen warfarin (COUMADIN) 5 MG tablet Take 2.5-5 mg by mouth daily. 1/2 tablet (2.5 mg) on Wednesday and Sunday, 1 tablet (5 mg) on remainder of days         Allergies  Allergen Reactions  . Penicillins Other (See Comments)    Unknown reaction many years ago     Vital Signs: BP 106/58  Pulse 84  Resp 18  Ht 5\' 8"  (1.727 m)  Wt 142 lb 6.4 oz (64.592 kg)  BMI 21.65 kg/m2  SpO2 97%  PHYSICAL EXAM: Elderly kyphotic in no acute distress. Looks good HEENT: normal Neck: JVP 6-7, +v waves.  Carotids 2+ bilaterally Cardiac:  normal S1, S2; RRR; 3/6 MR; No S3 Lungs:  Course breath sounds at the bases bilaterally, no wheezing, rhonchi or rales Abd: soft, nontender, no hepatomegaly Ext: No cyanosis, trace ankle edema on left.  Bilateral ted hose in place Skin: warm and dry;  chest/upper abdomen, no erythema Neuro:  CNs 2-12 intact, no focal abnormalities noted Psych: Normal affect    ASSESSMENT AND PLAN:

## 2012-05-25 NOTE — Telephone Encounter (Signed)
Pt is scheduled to see [place] today, called and Left message to call back

## 2012-05-25 NOTE — Assessment & Plan Note (Addendum)
Chronic, severe MR. Not a surgical candidate.   Attending: Agree

## 2012-05-29 ENCOUNTER — Telehealth (HOSPITAL_COMMUNITY): Payer: Self-pay | Admitting: *Deleted

## 2012-05-29 NOTE — Telephone Encounter (Signed)
Monisha from hospice called in regards to [name].  His weight has gone up to 146.  She has instructed him to take his lasix 40 mg bid because of the weight increase and to also take a metolazone per his daughter. She wanted [place] to be aware. If you would like to speak with her, please give her a call. Thanks.

## 2012-06-02 ENCOUNTER — Ambulatory Visit: Payer: Self-pay | Admitting: Cardiology

## 2012-06-02 DIAGNOSIS — Z7901 Long term (current) use of anticoagulants: Secondary | ICD-10-CM

## 2012-06-02 DIAGNOSIS — I4891 Unspecified atrial fibrillation: Secondary | ICD-10-CM

## 2012-06-04 ENCOUNTER — Telehealth: Payer: Self-pay | Admitting: Cardiology

## 2012-06-04 NOTE — Telephone Encounter (Signed)
Received a call from the hospice nurse regarding Mr. Gladd. He stated the patient's daughter called him because the patient was complaining of sob. According to the hospice nurse, the patient's weight was 146lbs, sats were 91% on RA, he had basilar rales, and 1+ bilat ankle edema. He was in no acute distress. The patient reported to him he was taking 40mg  Lasix BID. The hospice nurse instructed him to hold his afternoon lasix today and take the PRN metolazone. I told the hospice nurse the patient should take the lasix 30 minutes after metolazone today as well as an additional of KCl. He will call the office tomorrow to determine need for additional diuresis and follow up lab work. The nurse stated understanding.  Golf, PA-C 06/04/2012 1:23 PM

## 2012-06-08 ENCOUNTER — Telehealth: Payer: Self-pay | Admitting: Internal Medicine

## 2012-06-08 NOTE — Telephone Encounter (Signed)
Message copied by Etheleen Sia on Mon Jun 08, 2012  2:17 PM ------      Message from: Rene Paci A      Created: Mon Jun 08, 2012 12:47 PM       Noted - thanks      ----- Message -----         From: Etheleen Sia         Sent: 06/08/2012   9:27 AM           To: Newt Lukes, MD            Mr Bauserman's daughter called to cancel his July 31 appt and to let you know he is under Hospice care now.  She will call back if he needs to come in.

## 2012-06-12 ENCOUNTER — Ambulatory Visit (INDEPENDENT_AMBULATORY_CARE_PROVIDER_SITE_OTHER): Payer: Medicare Other | Admitting: Cardiovascular Disease

## 2012-06-12 DIAGNOSIS — Z7901 Long term (current) use of anticoagulants: Secondary | ICD-10-CM

## 2012-06-12 DIAGNOSIS — I4891 Unspecified atrial fibrillation: Secondary | ICD-10-CM

## 2012-06-12 LAB — POCT INR: INR: 1.8

## 2012-06-24 ENCOUNTER — Ambulatory Visit: Payer: Self-pay | Admitting: Cardiology

## 2012-06-24 DIAGNOSIS — Z7901 Long term (current) use of anticoagulants: Secondary | ICD-10-CM

## 2012-06-24 DIAGNOSIS — I4891 Unspecified atrial fibrillation: Secondary | ICD-10-CM

## 2012-06-24 LAB — PROTIME-INR: INR: 1.8 — AB (ref <=1.1)

## 2012-06-25 ENCOUNTER — Ambulatory Visit (HOSPITAL_COMMUNITY)
Admission: RE | Admit: 2012-06-25 | Discharge: 2012-06-25 | Disposition: A | Discharge status: Home / Self Care | Payer: Medicare Other | Source: Ambulatory Visit | Attending: Internal Medicine | Admitting: Internal Medicine

## 2012-06-25 ENCOUNTER — Encounter (HOSPITAL_COMMUNITY): Payer: Self-pay

## 2012-06-25 VITALS — BP 90/60 | HR 84 | Ht 68.0 in | Wt 147.1 lb

## 2012-06-25 DIAGNOSIS — I5022 Chronic systolic (congestive) heart failure: Secondary | ICD-10-CM | POA: Insufficient documentation

## 2012-06-25 DIAGNOSIS — I4891 Unspecified atrial fibrillation: Secondary | ICD-10-CM | POA: Insufficient documentation

## 2012-06-25 NOTE — Assessment & Plan Note (Signed)
Has chronic AF. Struggling with INR checks. We discussed switching to ASA or using NOAC. Will use apixaban 2.5 bid.

## 2012-06-25 NOTE — Progress Notes (Signed)
History of Present Illness: Primary Cardiologist:  Dr. Arvilla Meres   HPI:  [name] is a delightful 78 y.o. male with history congestive heart failure secondary to ischemic cardiomyopathy. He has a totally occluded LAD on cardiac catheterization with nonobstructive disease elsewhere. His EF previously in the 25-30% range. However echo 04/2010 showed EF 45% with mod-sev MR. He is status post BiV ICD. He has also had problems with atrial fibrillation and undergone several cardioversions (and several anti-arrhhtymics) but now with chronic atrial fib.   12/2011: ECHO: EF 35-40%, hypokinesis of apical myocardium and septal myocardium. Moderate MR with valve area 1.85 cm^2. LA severely dilated, RA moderately dilated.   He returns for follow up. Admitted 6/9-6/17 for decompensated heart failure requiring milrinone for diuresis. He diuresed 9 liters, discharge weight 146 pounds. The patient and family decided on palliative care consult with hospice at discharge through [facility]. Decided on DNR.   He is here with his daughters and wife. He is feeling poorly. Very weak. Having a hard time walking. Using an Art gallery manager. Had a fall. Weight stable. Takes metolazone about 1x/week. On home O2. Poor appetite. Requiring assistance to dress.   Past Medical History  Diagnosis Date  . Systolic CHF, chronic   . Ischemic cardiomyopathy     a.  echo 8/08: EF 20-30%;  b. echo 5/11: EF 45%, mod to severe MR  . Permanent atrial fibrillation     Amiodarone stopped due to recurrent AFib  . CAD (coronary artery disease)     a. cath 9/06: occluded LAD with L-L and R-L collaterals; LM 20%, CFX 20%, OM1 30%, RCA 20-30%, EF 45%  . Mitral regurgitation     a. mod to severe by echo 5/11  . Anemia     2/2 GI bleed  . Symptomatic bradycardia     a. s/p BiV-ICD;  b. s/p change to BiV pacer only 12/11  . HTN (hypertension)   . HLD (hyperlipidemia)   . Osteoporosis   . BPH (benign prostatic  hypertrophy)   . Glaucoma   . Urinary retention   . Depression   . Cataracts, bilateral     Retinal bleeding after cataract removal  . Glaucoma   . Hiatal hernia     Current Outpatient Prescriptions  Medication Sig Dispense Refill  . B Complex-C (SUPER B COMPLEX/VITAMIN C PO) Take 1 tablet by mouth daily.      . clonazePAM (KLONOPIN) 0.5 MG tablet Take 0.25 mg by mouth at bedtime as needed. anxiety      . doxazosin (CARDURA) 4 MG tablet Take 4 mg by mouth at bedtime.      . ergocalciferol (VITAMIN D2) 50000 UNITS capsule Take 50,000 Units by mouth once a week. On Fridays      . fexofenadine (ALLEGRA) 180 MG tablet Take 1 tablet (180 mg total) by mouth daily.  30 tablet  5  . finasteride (PROSCAR) 5 MG tablet Take 5 mg by mouth daily.      . furosemide (LASIX) 40 MG tablet Take 40 mg by mouth 2 (two) times daily.      . metolazone (ZAROXOLYN) 2.5 MG tablet Take 2.5 mg by mouth daily as needed. Additional fluid      . Multiple Vitamins-Minerals (OCUVITE PO) Take 1 tablet by mouth daily.       Marland Kitchen omeprazole (PRILOSEC) 20 MG capsule Take 20 mg by mouth daily.      . potassium chloride SA (K-DUR,KLOR-CON) 20 MEQ tablet Take  20 mEq by mouth daily.      . pravastatin (PRAVACHOL) 40 MG tablet Take 40 mg by mouth daily.      . sertraline (ZOLOFT) 100 MG tablet Take 1 tablet (100 mg total) by mouth daily.  30 tablet  6  . Tamsulosin HCl (FLOMAX) 0.4 MG CAPS Take 0.8 mg by mouth daily.       Marland Kitchen warfarin (COUMADIN) 5 MG tablet Take 2.5-5 mg by mouth daily. 1/2 tablet (2.5 mg) on Wednesday and Sunday, 1 tablet (5 mg) on remainder of days      . DISCONTD: furosemide (LASIX) 40 MG tablet Take 1 tablet (40 mg total) by mouth daily.  60 tablet  3  . mirtazapine (REMERON) 15 MG tablet Take 1 tablet (15 mg total) by mouth at bedtime.  30 tablet  3    Allergies  Allergen Reactions  . Penicillins Other (See Comments)    Unknown reaction many years ago    History  Substance Use Topics  . Smoking  status: Never Smoker   . Smokeless tobacco: Not on file   Comment: Married lives with wife, [facility]. 3 children involved in care-bro in [place], 2 sis in town  . Alcohol Use: No     Vital Signs: BP 90/60  Pulse 84  Ht 5\' 8"  (1.727 m)  Wt 147 lb 1.9 oz (66.733 kg)  BMI 22.37 kg/m2  SpO2 96%  PHYSICAL EXAM: Elderly kyphotic in no acute distress HEENT: normal Neck: JVP 9. Carotids 2+ bilaterally Cardiac:  normal S1, S2; RRR; 3/6 holosystolic murmur heard best at the apex; No S3 Lungs:  Clear, no wheezing, rhonchi or rales Abd: soft, nontender, no hepatomegaly Ext: 1-2+ bilateral ankle edema. No pretibial edema.  Skin: warm and dry;  chest/upper abdomen, no erythema Neuro:  CNs 2-12 intact, no focal abnormalities noted Psych: Normal affect   ASSESSMENT AND PLAN:

## 2012-06-25 NOTE — Assessment & Plan Note (Addendum)
Continues to decline and nearing the end of his life. I discussed this with him and his family. Continue Hospice Care. Volume status up slightly. Reinforced need for daily weights and reviewed use of sliding scale diuretics. Check labs today. See back in 1 month.

## 2012-06-30 ENCOUNTER — Telehealth (HOSPITAL_COMMUNITY): Payer: Self-pay | Admitting: Cardiology

## 2012-06-30 NOTE — Telephone Encounter (Signed)
Nurse would like to know if liquid morphine(q 4 hr prn) could be started to help with pts occasional SoB. If so ok to send Rx to pts pharm [facility], must write Hospice pt on RX) Any questions pls call Monisha Thanks!

## 2012-07-01 ENCOUNTER — Ambulatory Visit: Payer: Medicare Other | Admitting: Internal Medicine

## 2012-07-01 MED ORDER — MORPHINE SULFATE 20 MG/5ML PO SOLN: 5.0000 mg | ORAL | Status: DC | PRN

## 2012-07-01 NOTE — Telephone Encounter (Signed)
Will send in Rx for morphine, all other refills per Hospice doc.  Have discussed with Monisha.  Also SBP running 90 will stop cardura at this time.  Monisha appreciated call back.

## 2012-07-06 ENCOUNTER — Ambulatory Visit: Payer: Self-pay | Admitting: Pharmacist

## 2012-07-06 DIAGNOSIS — I4891 Unspecified atrial fibrillation: Secondary | ICD-10-CM

## 2012-07-06 DIAGNOSIS — Z7901 Long term (current) use of anticoagulants: Secondary | ICD-10-CM

## 2012-07-09 IMAGING — CR DG CHEST 2V
2 series · 2 of 2 positions shown · non-contrast
Comparison: 04/08/2011 and 04/07/2011 radiographs.

CLINICAL DATA: Weakness with fatigue for 1 week.

CHEST - 2 VIEW

[w chest pa]
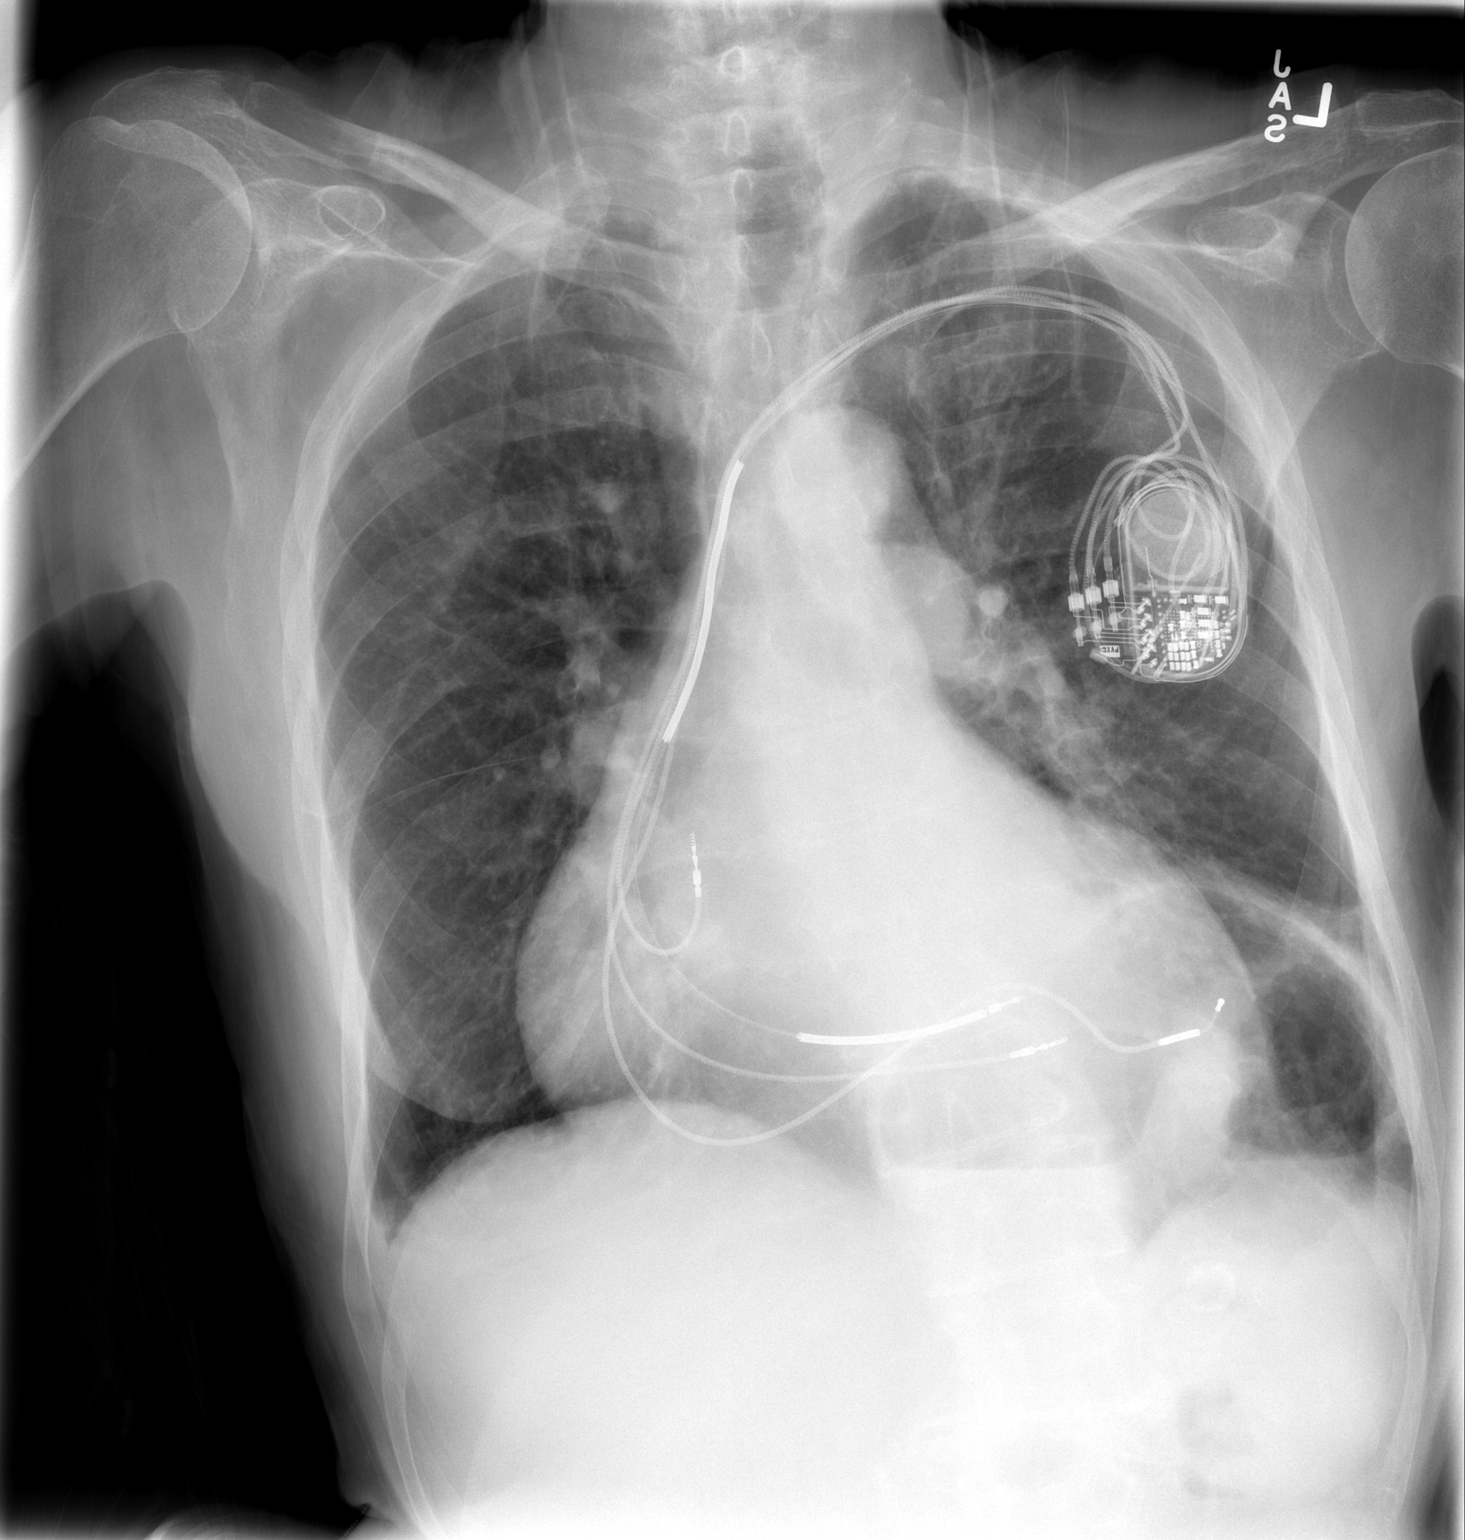

[w chest lat]
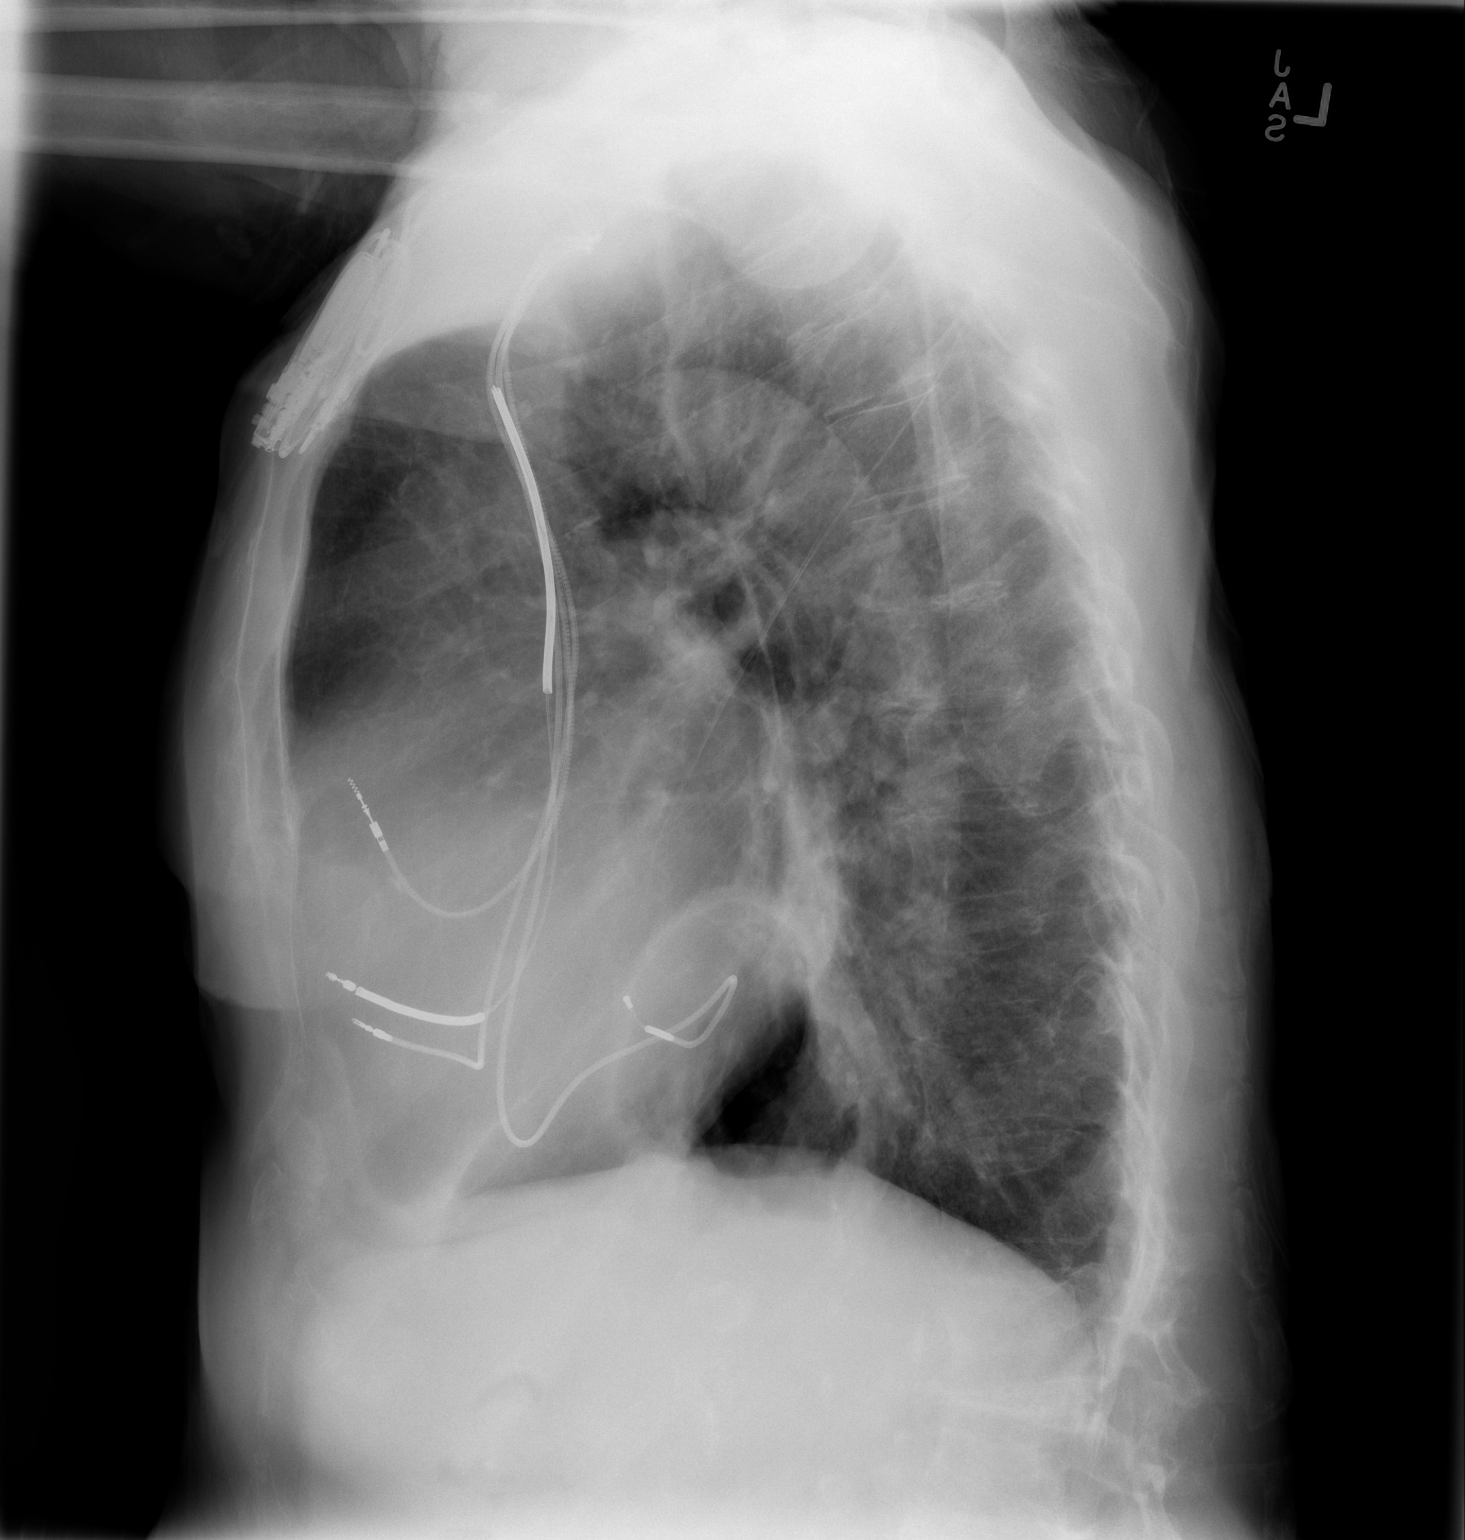

[2 of 2 positions shown; findings below may reference images not displayed]

FINDINGS: Left subclavian pacemaker leads appear unchanged within
the right atrium, right ventricle and coronary sinus.  There is
stable cardiomegaly.  Overall pulmonary aeration has improved.
There is a hiatal hernia extending over the left hemidiaphragm
which appears unchanged.  No acute osseous findings are evident.
IMPRESSION: Improved basilar aeration compared with most recent examination
with stable cardiomegaly.  No acute cardiopulmonary process
demonstrated.

## 2012-07-13 ENCOUNTER — Ambulatory Visit (INDEPENDENT_AMBULATORY_CARE_PROVIDER_SITE_OTHER): Payer: Medicare Other | Admitting: *Deleted

## 2012-07-13 ENCOUNTER — Encounter: Payer: Self-pay | Admitting: Internal Medicine

## 2012-07-13 ENCOUNTER — Telehealth (HOSPITAL_COMMUNITY): Payer: Self-pay | Admitting: *Deleted

## 2012-07-13 ENCOUNTER — Inpatient Hospital Stay (HOSPITAL_BASED_OUTPATIENT_CLINIC_OR_DEPARTMENT_OTHER): Admission: RE | Admit: 2012-07-13 | Discharge: 2012-07-13 | Payer: Medicare Other | Source: Ambulatory Visit

## 2012-07-13 DIAGNOSIS — I5023 Acute on chronic systolic (congestive) heart failure: Secondary | ICD-10-CM

## 2012-07-13 DIAGNOSIS — I4891 Unspecified atrial fibrillation: Secondary | ICD-10-CM

## 2012-07-13 NOTE — Telephone Encounter (Signed)
Pt's weight is up a little and he has been SOB all weekend they will send an optivol transmission

## 2012-07-13 NOTE — Progress Notes (Signed)
Pt's Optivol reviewed by [name], PA it is only starting to slightly increase, pt's daughter reports wt was 143 yesterday and 147 today and he as been very SOB over the weekend will give him a dose of Metolazone today and keep appt on Thursday

## 2012-07-16 ENCOUNTER — Encounter (HOSPITAL_COMMUNITY): Payer: Self-pay

## 2012-07-16 ENCOUNTER — Ambulatory Visit (HOSPITAL_COMMUNITY)
Admission: RE | Admit: 2012-07-16 | Discharge: 2012-07-16 | Disposition: A | Discharge status: Home / Self Care | Source: Ambulatory Visit | Attending: Internal Medicine | Admitting: Internal Medicine

## 2012-07-16 VITALS — BP 110/62 | HR 76 | Ht 68.0 in | Wt 142.4 lb

## 2012-07-16 DIAGNOSIS — I5022 Chronic systolic (congestive) heart failure: Secondary | ICD-10-CM | POA: Insufficient documentation

## 2012-07-16 NOTE — Assessment & Plan Note (Addendum)
Mr. Schloemer has end stage heart failure with mild volume overload today.  His dry weight has decreased with muscle wasting.  Will try to decrease weight 136-138 pounds with addition of metolazone 2.5 mg on Mon and Fri.  If weight is below 136 pounds metolazone will be held.     Patient seen and examined with [name], PA-C. We discussed all aspects of the encounter. I agree with the assessment and plan as stated above. Unfortunately Mr. Jabs continues to struggle with end-stage HF. He continues to lose weight. We have suggested that we drop his dry weight down and use metolazone to help control his volume status. We will continue Hospice support. Extensive discussion about what to expect over the next few months with his family. Symptom management will be critical.

## 2012-07-16 NOTE — Patient Instructions (Addendum)
Start taking metolazone 2.5 mg every Monday and Friday.  Take potassium twice daily on days that metolazone is used.   Would like weight 136-138 pounds.  Do not take metolazone if weight is less than 136 pounds.    Lab work through hospice Friday and Tuesday.  Can stop vitamins and cholesterol medicine.

## 2012-07-16 NOTE — Progress Notes (Signed)
History of Present Illness: Primary Cardiologist:  Dr. Arvilla Meres   HPI:  [name] is a delightful 78 y.o. male with history congestive heart failure secondary to ischemic cardiomyopathy. He has a totally occluded LAD on cardiac catheterization with nonobstructive disease elsewhere. His EF previously in the 25-30% range. However echo 04/2010 showed EF 45% with mod-sev MR. He is status post BiV ICD. He has also had problems with atrial fibrillation and undergone several cardioversions (and several anti-arrhhtymics) but now with chronic atrial fib.   12/2011: ECHO: EF 35-40%, hypokinesis of apical myocardium and septal myocardium. Moderate MR with valve area 1.85 cm^2. LA severely dilated, RA moderately dilated.   He returns for follow up. Admitted 6/9-6/17 for decompensated heart failure requiring milrinone for diuresis. He diuresed 9 liters, discharge weight 146 pounds. The patient and family decided on palliative care consult with hospice at discharge through [facility]. Decided on DNR.   He is here with his daughters and wife.  He had a bad weekend where he was unable to get out of chair.  Weight increased from 142-147 pounds.  Daughters are worried that weight is not accurate for sliding scale because he is not eating much.  Continues to feel weak.  He required O2 use.  Very happy with hospice care.     Past Medical History  Diagnosis Date  . Systolic CHF, chronic   . Ischemic cardiomyopathy     a.  echo 8/08: EF 20-30%;  b. echo 5/11: EF 45%, mod to severe MR  . Permanent atrial fibrillation     Amiodarone stopped due to recurrent AFib  . CAD (coronary artery disease)     a. cath 9/06: occluded LAD with L-L and R-L collaterals; LM 20%, CFX 20%, OM1 30%, RCA 20-30%, EF 45%  . Mitral regurgitation     a. mod to severe by echo 5/11  . Anemia     2/2 GI bleed  . Symptomatic bradycardia     a. s/p BiV-ICD;  b. s/p change to BiV pacer only 12/11  . HTN (hypertension)   .  HLD (hyperlipidemia)   . Osteoporosis   . BPH (benign prostatic hypertrophy)   . Glaucoma   . Urinary retention   . Depression   . Cataracts, bilateral     Retinal bleeding after cataract removal  . Glaucoma   . Hiatal hernia     Current Outpatient Prescriptions  Medication Sig Dispense Refill  . B Complex-C (SUPER B COMPLEX/VITAMIN C PO) Take 1 tablet by mouth daily.      . clonazePAM (KLONOPIN) 0.5 MG tablet Take 0.25 mg by mouth at bedtime as needed. anxiety      . doxazosin (CARDURA) 4 MG tablet Take 4 mg by mouth at bedtime.      . ergocalciferol (VITAMIN D2) 50000 UNITS capsule Take 50,000 Units by mouth once a week. On Fridays      . fexofenadine (ALLEGRA) 180 MG tablet Take 1 tablet (180 mg total) by mouth daily.  30 tablet  5  . finasteride (PROSCAR) 5 MG tablet Take 5 mg by mouth daily.      . furosemide (LASIX) 40 MG tablet Take 40 mg by mouth 2 (two) times daily.      . metolazone (ZAROXOLYN) 2.5 MG tablet Take 2.5 mg by mouth daily as needed. Additional fluid      . mirtazapine (REMERON) 15 MG tablet Take 1 tablet (15 mg total) by mouth at bedtime.  30 tablet  3  . morphine 20 MG/5ML solution Take 1.3 mLs (5.2 mg total) by mouth every 4 (four) hours as needed for pain (Pain or shortness of breath). HOSPICE PATIENT  30 mL  0  . morphine 20 MG/5ML solution Take 1.3 mLs (5.2 mg total) by mouth every 4 (four) hours as needed for pain (Pain or shortness of breath).  30 mL  0  . Multiple Vitamins-Minerals (OCUVITE PO) Take 1 tablet by mouth daily.       Marland Kitchen omeprazole (PRILOSEC) 20 MG capsule Take 20 mg by mouth daily.      . potassium chloride SA (K-DUR,KLOR-CON) 20 MEQ tablet Take 20 mEq by mouth daily.      . pravastatin (PRAVACHOL) 40 MG tablet Take 40 mg by mouth daily.      . sertraline (ZOLOFT) 100 MG tablet Take 1 tablet (100 mg total) by mouth daily.  30 tablet  6  . Tamsulosin HCl (FLOMAX) 0.4 MG CAPS Take 0.8 mg by mouth daily.       Marland Kitchen warfarin (COUMADIN) 5 MG tablet  Take 2.5-5 mg by mouth daily. 1/2 tablet (2.5 mg) on Wednesday and Sunday, 1 tablet (5 mg) on remainder of days        Allergies  Allergen Reactions  . Penicillins Other (See Comments)    Unknown reaction many years ago    Vital Signs: Filed Vitals:   07/16/12 1457  BP: 110/62  Pulse: 76  Height: 5\' 8"  (1.727 m)  Weight: 142 lb 6 oz (64.581 kg)  SpO2: 94%    PHYSICAL EXAM: Elderly kyphotic in no acute distress HEENT: normal Neck: JVP 9. Carotids 2+ bilaterally Cardiac:  normal S1, S2; RRR; 3/6 holosystolic murmur heard best at the apex; No S3 Lungs:  Clear, no wheezing, rhonchi or rales Abd: soft, nontender, no hepatomegaly Ext: 1+ ankle edema. No pretibial edema.  Skin: warm and dry;  chest/upper abdomen, no erythema Neuro:  CNs 2-12 intact, no focal abnormalities noted Psych: Normal affect   ASSESSMENT AND PLAN:

## 2012-07-17 LAB — REMOTE PACEMAKER DEVICE
AL AMPLITUDE: 0.3 mv
AL IMPEDENCE PM: 418 Ohm
RV LEAD AMPLITUDE: 11.9 mv
RV LEAD IMPEDENCE PM: 418 Ohm
RV LEAD THRESHOLD: 0.625 V

## 2012-07-24 ENCOUNTER — Encounter: Payer: Self-pay | Admitting: *Deleted

## 2012-07-28 ENCOUNTER — Encounter: Payer: Self-pay | Admitting: Internal Medicine

## 2012-08-26 DIAGNOSIS — R0989 Other specified symptoms and signs involving the circulatory and respiratory systems: Secondary | ICD-10-CM

## 2012-08-31 ENCOUNTER — Telehealth (HOSPITAL_COMMUNITY): Payer: Self-pay | Admitting: *Deleted

## 2012-08-31 NOTE — Telephone Encounter (Signed)
Britta Mccreedy called to let us know that pt is moving into the skilled facility at [facility] today under the care of Dr Chilton Si, she asked that his records be faxed to Dr Chilton Si at [phone], last 2 OV notes along with cath, echo and labs faxed

## 2012-08-31 NOTE — Telephone Encounter (Signed)
Pt's medication list faxed to Corey Harold at [facility] at [phone]

## 2012-09-02 ENCOUNTER — Telehealth (HOSPITAL_COMMUNITY): Payer: Self-pay | Admitting: *Deleted

## 2012-09-02 NOTE — Telephone Encounter (Signed)
Received call from Danielle she is taking care of Mr Shatto and states his family is concerned b/c his wt is 49 today he is new to their facility so she is not sure what he has been running but per family he was 143 yesterday, she states pt is asymptomatic, he is breathing normally and no large amount of edema, she would like an order to give extra lasix if needed per family request.  She also reports pt's K is 3.0, he is on 20 meq daily except Mon and Fri when he gets 40 meq, their NP ask that we address since they are not familiar with pt.  Per Tonye Becket, NP order faxed to [facility] at [phone] for extra 40 mg of lasix if pt gains 3 lbs in 24 hours or 5 lbs in 1 week, give extra 60 meq of KCL today and then increase dose to 40 meq daily, recheck bmet in 1 week

## 2012-09-09 ENCOUNTER — Telehealth (HOSPITAL_COMMUNITY): Payer: Self-pay | Admitting: *Deleted

## 2012-09-09 NOTE — Telephone Encounter (Signed)
Pt's K 3.3 per [name], PA give extra 40 meq of KCL today, nurse at [facility] aware

## 2012-10-22 ENCOUNTER — Observation Stay (HOSPITAL_COMMUNITY)
Admission: AD | Admit: 2012-10-22 | Discharge: 2012-11-01 | Disposition: E | Source: Ambulatory Visit | Attending: Internal Medicine | Admitting: Internal Medicine

## 2012-10-22 DIAGNOSIS — R410 Disorientation, unspecified: Secondary | ICD-10-CM

## 2012-10-22 DIAGNOSIS — R404 Transient alteration of awareness: Secondary | ICD-10-CM

## 2012-10-22 DIAGNOSIS — E785 Hyperlipidemia, unspecified: Secondary | ICD-10-CM | POA: Insufficient documentation

## 2012-10-22 DIAGNOSIS — I2589 Other forms of chronic ischemic heart disease: Principal | ICD-10-CM | POA: Insufficient documentation

## 2012-10-22 DIAGNOSIS — I10 Essential (primary) hypertension: Secondary | ICD-10-CM | POA: Insufficient documentation

## 2012-10-22 DIAGNOSIS — I4891 Unspecified atrial fibrillation: Secondary | ICD-10-CM | POA: Insufficient documentation

## 2012-10-22 DIAGNOSIS — I509 Heart failure, unspecified: Secondary | ICD-10-CM | POA: Insufficient documentation

## 2012-10-22 DIAGNOSIS — I5023 Acute on chronic systolic (congestive) heart failure: Secondary | ICD-10-CM | POA: Insufficient documentation

## 2012-10-22 DIAGNOSIS — I2510 Atherosclerotic heart disease of native coronary artery without angina pectoris: Secondary | ICD-10-CM | POA: Insufficient documentation

## 2012-10-22 MED ORDER — LORAZEPAM 2 MG/ML IJ SOLN: 1.0000 mg | INTRAMUSCULAR | Status: DC | PRN

## 2012-10-22 MED ORDER — SODIUM CHLORIDE 0.9 % IV SOLN: 250.0000 mL | INTRAVENOUS | Status: DC | PRN

## 2012-10-22 MED ORDER — MORPHINE SULFATE 25 MG/ML IV SOLN
5.0000 mg/h | INTRAVENOUS | Status: DC
  Administered 2012-10-22: 5 mg/h via INTRAVENOUS
  Filled 2012-10-22 (×2): qty 10

## 2012-10-22 MED ORDER — MORPHINE BOLUS VIA INFUSION
4.0000 mg | INTRAVENOUS | Status: DC | PRN
  Filled 2012-10-22: qty 4

## 2012-10-22 MED ORDER — SODIUM CHLORIDE 0.9 % IJ SOLN
3.0000 mL | INTRAMUSCULAR | Status: DC | PRN
Start: 2012-10-22 — End: 2012-10-22

## 2012-10-22 MED ORDER — SODIUM CHLORIDE 0.9 % IJ SOLN: 3.0000 mL | INTRAMUSCULAR | Status: DC | PRN

## 2012-10-22 MED ORDER — ACETAMINOPHEN 325 MG PO TABS: 650.0000 mg | ORAL_TABLET | ORAL | Status: DC | PRN

## 2012-10-22 MED ORDER — ATROPINE SULFATE 1 % OP SOLN
1.0000 [drp] | Freq: Three times a day (TID) | OPHTHALMIC | Status: DC
  Administered 2012-10-22: 1 [drp] via SUBLINGUAL
  Filled 2012-10-22: qty 2

## 2012-10-22 MED ORDER — ONDANSETRON HCL 4 MG/2ML IJ SOLN: 4.0000 mg | Freq: Four times a day (QID) | INTRAMUSCULAR | Status: DC | PRN

## 2012-10-22 MED ORDER — SODIUM CHLORIDE 0.9 % IJ SOLN: 3.0000 mL | Freq: Two times a day (BID) | INTRAMUSCULAR | Status: DC

## 2012-10-22 NOTE — H&P (Signed)
HPI: [name] is an 78 y.o. male with end stage heart failure secondary to ischemic cardiomyopathy. He has been followed closely in the HF clinic. Prior to admit he has been followed by [facility] due to end stage heart failure.  He has continued to decline with increased respiratory distress that has been uncontrolled at Assisted Living. Volume status had become difficult to manage with lasix and metolazone.  Family reports increased agitation and dyspnea at rest. He is admitted today for comfort management.     Review of Systems: Family provided information as he is sedated    Cardiac Review of Systems: {Y] = yes [ ]  = no  Chest Pain [    ]  Resting SOB [Y   ] Exertional SOB  [Y  ]  Orthopnea [ Y ]   Pedal Edema [   ]    Palpitations [  ] Syncope  [  ]   Presyncope [   ]  General Review of Systems: [Y] = yes [  ]=no Constitional: recent weight change [  ]; anorexia [  ]; fatigue [ Y ]; nausea [  ]; night sweats [  ]; fever [  ]; or chills [  ];                                                                                                                                          Dental: poor dentition[  ]; Last Dentist visit:   Eye : blurred vision [  ]; diplopia [   ]; vision changes [  ];  Amaurosis fugax[  ]; Resp: cough [  ];  wheezing[  ];  hemoptysis[  ]; shortness of breath[  ]; paroxysmal nocturnal dyspnea[  ]; dyspnea on exertion[  ]; or orthopnea[  ];  GI:  gallstones[  ], vomiting[  ];  dysphagia[  ]; melena[  ];  hematochezia [  ]; heartburn[  ];   Hx of  Colonoscopy[  ]; GU: kidney stones [  ]; hematuria[  ];   dysuria [  ];  nocturia[  ];  history of     obstruction [  ];                 Skin: rash, swelling[  ];, hair loss[  ];  peripheral edema[  ];  or itching[  ]; Musculosketetal: myalgias[  ];  joint swelling[  ];  joint erythema[  ];  joint pain[  ];  back pain[  ];  Heme/Lymph: bruising[  ];  bleeding[  ];  anemia[  ];  Neuro: TIA[  ];   headaches[  ];  stroke[  ];  vertigo[  ];  seizures[  ];   paresthesias[  ];  difficulty walking[ Y ];  Psych:depression[  ]; anxiety[  ];  Endocrine: diabetes[  ];  thyroid dysfunction[  ];  Immunizations: Flu [  ]; Pneumococcal[  ];  Other:  Past Medical History  Diagnosis Date  . Systolic CHF, chronic   . Ischemic cardiomyopathy     a.  echo 8/08: EF 20-30%;  b. echo 5/11: EF 45%, mod to severe MR  . Permanent atrial fibrillation     Amiodarone stopped due to recurrent AFib  . CAD (coronary artery disease)     a. cath 9/06: occluded LAD with L-L and R-L collaterals; LM 20%, CFX 20%, OM1 30%, RCA 20-30%, EF 45%  . Mitral regurgitation     a. mod to severe by echo 5/11  . Anemia     2/2 GI bleed  . Symptomatic bradycardia     a. s/p BiV-ICD;  b. s/p change to BiV pacer only 12/11  . HTN (hypertension)   . HLD (hyperlipidemia)   . Osteoporosis   . BPH (benign prostatic hypertrophy)   . Glaucoma   . Urinary retention   . Depression   . Cataracts, bilateral     Retinal bleeding after cataract removal  . Glaucoma   . Hiatal hernia     No prescriptions prior to admission     Allergies  Allergen Reactions  . Penicillins Other (See Comments)    Unknown reaction many years ago    History   Social History  . Marital Status: Married    Spouse Name: N/A    Number of Children: 3  . Years of Education: N/A   Occupational History  . retired    Social History Main Topics  . Smoking status: Never Smoker   . Smokeless tobacco: Not on file     Comment: Married lives with wife, lives at [facility]. 3 children involved in care-bro in [place], 2 sis in town  . Alcohol Use: No  . Drug Use: No  . Sexually Active: Not on file   Other Topics Concern  . Not on file   Social History Narrative  . No narrative on file   Family hx: non-contributory  PHYSICAL EXAM: Elderly kyphotic. Frail appearing. Cachetic Awake but agitated HEENT: normal  Neck: JVP  10-11. Carotids 2+ bilaterally  Cardiac: normal S1, S2; RRR; 3/6 holosystolic murmur heard best at the apex; No S3  Lungs: Rhonci noted RML RLL LLL  Abd: soft, nontender, no hepatomegaly  Ext:  warm. No pretibial edema.  Skin: warm and dry; chest/upper abdomen, no erythema  Neuro: Sedated but opens his eyes     ASSESSMENT: 1. End Stage Systolic Heart Failure  2. Ischemic Cardiomyopathy, EF 35-40%  3. Moderate to severe MR, valve area 1.85 cm^2  4. DNR/DNI 5. Chronic A fib 6. Delirium 7. Protein-calorie malnutrition  PLAN/DISCUSSION: Mr [name] has end stage heart failure and he is in the terminal phase with respiratory distress and agitation.  Despite Hospice assistance he has struggled with agitation and dyspnea. Discussed with his family and they requested admit for terminal comfort care. Will admit and start Morphine drip for comfort care.     Patient seen and examined with Tonye Becket, NP. We discussed all aspects of the encounter. I agree with the assessment and plan as stated above.   [name] has end-stage HF with progressive decline. Now struggling and uncomfortable. Will admit for comfort care and initiation of morphine gtt with prn benzos as needed. Long talk with family who agrees.   Taylon Louison,MD 5:25 PM

## 2012-10-22 NOTE — Progress Notes (Addendum)
Room 4706 - Claybon Jabs - [facility] RN Visit-R.Lindia Garms RN  Related admission to [facility] diagnosis of CHF.   Pt is DNR code.    Pt obtunded, lying in bed, wife at bedside holding pt's hand and numerous family members present.  Comfort cart ordered.  Per [facility] staff RN - pt at EOL and direct admit per Dr. Gala Romney for terminal agitation.  Pt appears comfortable, cyanotic lips.  Patient's home medication list is on shadow chart.   Please call [facility] @ [phone]- ask for RN Liaison or after hours,ask for on-call RN with any hospice needs.    Thank you.  Joneen Boers, RN  [facility]  Hospice Liaison

## 2012-10-23 ENCOUNTER — Encounter (HOSPITAL_COMMUNITY): Payer: Self-pay | Admitting: *Deleted

## 2012-10-23 LAB — BASIC METABOLIC PANEL
BUN: 29 mg/dL — ABNORMAL HIGH (ref 6–23)
CO2: 34 mEq/L — ABNORMAL HIGH (ref 19–32)
Chloride: 91 mEq/L — ABNORMAL LOW (ref 96–112)
Creatinine, Ser: 1.21 mg/dL (ref 0.50–1.35)

## 2012-10-23 MED ORDER — LORAZEPAM 2 MG/ML IJ SOLN
INTRAMUSCULAR | Status: AC
  Administered 2012-10-23: 2 mg via INTRAVENOUS
  Filled 2012-10-23: qty 1

## 2012-10-23 MED ORDER — LORAZEPAM 2 MG/ML IJ SOLN
2.0000 mg | Freq: Once | INTRAMUSCULAR | Status: AC
  Administered 2012-10-23: 2 mg via INTRAVENOUS

## 2012-10-23 MED ORDER — ATROPINE SULFATE 1 % OP SOLN
1.0000 [drp] | Freq: Three times a day (TID) | OPHTHALMIC | Status: DC
  Administered 2012-10-23: 4 [drp] via SUBLINGUAL
  Administered 2012-10-23: 3 [drp] via SUBLINGUAL
  Filled 2012-10-23: qty 2

## 2012-10-24 NOTE — Progress Notes (Signed)
Wasted of morphine sulfate mixture left over of patient that was deceased with Lauren RN through sink

## 2012-10-29 NOTE — Discharge Summary (Signed)
Patient ID: [name] MRN: [id number] DOB/AGE: 1924-01-22 78 y.o.  Admit date: 10/10/2012 Discharge date: November 19, 2012   Death Summary  Primary Discharge Diagnosis  1) A/c systolic HF 2) Ischemic cardiomyopathy   Hospital Course:  [name] is an 78 y.o. male with end stage heart failure secondary to ischemic cardiomyopathy. He has been followed closely in the HF clinic. Prior to admit he has been followed by [facility] at [facility] due to end stage heart failure. He has continued to decline with increased respiratory distress that has been uncontrolled at Assisted Living. Volume status had become difficult to manage with lasix and metolazone. Family reports increased agitation and dyspnea at rest. A bed at [facility] was not available immediately. He was admitted for comfort management.   After discussion with [name] and his family it was clear he was suffering in the end stages of his life. He was started on morphine drip for comfort. IV ativan and atropine drops were given as needed.   He passed quietly with his family at the bedside on 11/20/2023.  Andre Creque,MD 12:08 PM

## 2012-11-01 NOTE — Progress Notes (Signed)
Room 4706, Claybon Jabs, [facility] SW Note SW met with family to assess their coping and needs.  Pt very comfortable and family aware and accepting of pt's terminal status.  Counseling provided to family.  Please call [facility] when pt passes at [phone].  Burna Cash, LCSW, [facility]

## 2012-11-01 NOTE — Progress Notes (Signed)
   Subjective:  HPI:  [name] is an 78 y.o. male with end stage heart failure secondary to ischemic cardiomyopathy. He has been followed closely in the HF clinic. Prior to admit he has been followed by [facility] at [facility] due to end stage heart failure. He has continued to decline with increased respiratory distress that has been uncontrolled at [facility]isted Living. Volume status had become difficult to manage with lasix and metolazone. Family reports increased agitation and dyspnea at rest. He is admitted for comfort management.   Remains on morphine drip. Sedated. Agona respirations at times.   Intake/Output Summary (Last 24 hours) at 10/16/2012 0832 Last data filed at 10/15/2012 0558  Gross per 24 hour  Intake   61.5 ml  Output      0 ml  Net   61.5 ml    Current meds:    . atropine  1 drop Sublingual TID  . sodium chloride  3 mL Intravenous Q12H  . [DISCONTINUED] sodium chloride  3 mL Intravenous Q12H   Infusions:    . morphine 5 mg/hr (21-Nov-2012 1740)     Objective:  Blood pressure 94/59, pulse 77, temperature 99.6 F (37.6 C), temperature source Oral, resp. rate 17, height 5\' 8"  (1.727 m), weight 58.242 kg (128 lb 6.4 oz), SpO2 100.00%. Weight change:   Physical Exam: Elderly kyphotic. Frail appearing. Cachetic. Sedated. Agonal respirations at times HEENT: normal  Neck: supple Cardiac: normal S1, S2; RRR; 3/6 holosystolic murmur heard best at the apex; No S3  Lungs: shallow Abd: soft, nontender,  Ext: warm. No pretibial edema.   Neuro: Sedated     Lab Results: Basic Metabolic Panel:  Lab 10/22/2012 1610  NA 137  K 2.8*  CL 91*  CO2 34*  GLUCOSE 75  BUN 29*  CREATININE 1.21  CALCIUM 9.6  MG --  PHOS --   Liver Function Tests: No results found for this basename: AST:5,ALT:5,ALKPHOS:5,BILITOT:5,PROT:5,ALBUMIN:5 in the last 168 hours No results found for this basename: LIPASE:5,AMYLASE:5 in the last 168 hours No results found for this  basename: AMMONIA:5 in the last 168 hours CBC: No results found for this basename: WBC:5,NEUTROABS:5,HGB:5,HCT:5,MCV:5,PLT:5 in the last 168 hours Cardiac Enzymes: No results found for this basename: CKTOTAL:5,CKMB:5,CKMBINDEX:5,TROPONINI:5 in the last 168 hours BNP: No components found with this basename: POCBNP:5 CBG: No results found for this basename: GLUCAP:5 in the last 168 hours Microbiology: Lab Results  Component Value Date   CULT  Value: STAPHYLOCOCCUS SPECIES (COAGULASE NEGATIVE) Note: RIFAMPIN AND GENTAMICIN SHOULD NOT BE USED AS SINGLE DRUGS FOR TREATMENT OF STAPH INFECTIONS. 05/10/2012   CULT NO SALMONELLA, SHIGELLA, CAMPYLOBACTER, OR YERSINIA ISOLATED 04/08/2011   No results found for this basename: CULT:2,SDES:2 in the last 168 hours  Imaging: No results found.   ASSESSMENT:  1. End Stage Systolic Heart Failure  2. Ischemic Cardiomyopathy, EF 35-40%  3. Moderate to severe MR, valve area 1.85 cm^2  4. DNR/DNI  5. Chronic A fib  6. Delirium  7. Protein-calorie malnutrition   PLAN/DISCUSSION:   He is end-stage with Cheyne-Stokes respirations. Suspect he will pass today. Discussed with family.     LOS: 1 day    Arvilla Meres, MD 10/06/2012, 8:32 AM

## 2012-11-01 NOTE — Progress Notes (Signed)
Notified by family that  Patient having jerky like movements that had become more frequent and stronger intermittently. Notified attending MD, orders given to give 2mg  of IV Ativan, one time dose and continue to monitor. This occurred right at change of shift. Vital signs done,  Ativan has been given and patient is currently resting without any jerky-like movements and morphine drip continues to run to help continue to comfort patient. Family present in room also during this time. Oxygen saturation around 81% but to keep patient comfortable the wife and family opted to refuse placing him on oxygen. Notified oncoming nurse and they will continue to monitor as needed.

## 2012-11-01 NOTE — Progress Notes (Signed)
Pt lying in bed with several family members at bedside.  Respirations irregular with long periods of apnea.  Per Daughter he is expected to pass today.  They report believing that the pt is at peace.  All are pleased with the support that the pt and family has received from Dr Gala Romney, hospital staff, and [facility].  Reviewed what to expect from [facility] after pt dies.  PCG's denied any needs at this time.   This is a hospice related admission.  Please contact [facility] at [phone] at Time of Death.  Pam PetersonRN, [facility]

## 2012-11-01 NOTE — Progress Notes (Addendum)
Dr Gala Romney notified Patient's expiration VS 0/0 nonreactive dilated pupils,no evidence of respiration. Dr bensimhon ask to have death certificate to his office on Monday 10/26/2012

## 2012-11-01 NOTE — Progress Notes (Signed)
Pt was officially pronounced by Dr. Katha Cabal @10 :25 pm. [place] donors notified by Leotis Shames charge rn. Sharma Covert notified regarding expiration. Patient placement maDE AWARE OF EXPIRATION. Post mortem care done.

## 2012-11-01 NOTE — Progress Notes (Signed)
UR Chart Review Completed  

## 2012-11-01 DEATH — deceased

## 2014-11-10 ENCOUNTER — Encounter (HOSPITAL_COMMUNITY): Payer: Self-pay | Admitting: Internal Medicine
